# Patient Record
Sex: Female | Born: 1950
Health system: Southern US, Community
[De-identification: ages and names within clinical notes are randomized; demographics above are authoritative.]

## PROBLEM LIST (undated history)

## (undated) DIAGNOSIS — F32A Depression, unspecified: Secondary | ICD-10-CM

## (undated) DIAGNOSIS — M1990 Unspecified osteoarthritis, unspecified site: Secondary | ICD-10-CM

## (undated) DIAGNOSIS — K219 Gastro-esophageal reflux disease without esophagitis: Secondary | ICD-10-CM

## (undated) DIAGNOSIS — F329 Major depressive disorder, single episode, unspecified: Secondary | ICD-10-CM

## (undated) DIAGNOSIS — C801 Malignant (primary) neoplasm, unspecified: Secondary | ICD-10-CM

## (undated) DIAGNOSIS — Z87898 Personal history of other specified conditions: Secondary | ICD-10-CM

## (undated) DIAGNOSIS — R7303 Prediabetes: Secondary | ICD-10-CM

## (undated) DIAGNOSIS — F419 Anxiety disorder, unspecified: Secondary | ICD-10-CM

## (undated) DIAGNOSIS — J302 Other seasonal allergic rhinitis: Secondary | ICD-10-CM

## (undated) DIAGNOSIS — I8390 Asymptomatic varicose veins of unspecified lower extremity: Secondary | ICD-10-CM

## (undated) DIAGNOSIS — J45909 Unspecified asthma, uncomplicated: Secondary | ICD-10-CM

## (undated) HISTORY — DX: Unspecified asthma, uncomplicated: J45.909

## (undated) HISTORY — PX: COLONOSCOPY: SHX174

## (undated) HISTORY — PX: EYE SURGERY: SHX253

## (undated) HISTORY — PX: OTHER SURGICAL HISTORY: SHX169

## (undated) HISTORY — PX: TUBAL LIGATION: SHX77

## (undated) HISTORY — PX: VARICOSE VEIN SURGERY: SHX832

---

## 2000-05-19 ENCOUNTER — Ambulatory Visit (HOSPITAL_COMMUNITY): Admission: RE | Admit: 2000-05-19 | Discharge: 2000-05-19 | Payer: Self-pay | Admitting: Family Medicine

## 2000-05-19 ENCOUNTER — Encounter: Payer: Self-pay | Admitting: Family Medicine

## 2001-02-20 ENCOUNTER — Ambulatory Visit (HOSPITAL_COMMUNITY): Admission: RE | Admit: 2001-02-20 | Discharge: 2001-02-20 | Payer: Self-pay | Admitting: *Deleted

## 2001-07-12 ENCOUNTER — Ambulatory Visit (HOSPITAL_COMMUNITY): Admission: RE | Admit: 2001-07-12 | Discharge: 2001-07-12 | Payer: Self-pay | Admitting: Family Medicine

## 2001-07-12 ENCOUNTER — Encounter: Admission: RE | Admit: 2001-07-12 | Discharge: 2001-07-12 | Payer: Self-pay | Admitting: Family Medicine

## 2001-07-12 ENCOUNTER — Encounter: Payer: Self-pay | Admitting: Family Medicine

## 2002-09-25 ENCOUNTER — Ambulatory Visit (HOSPITAL_COMMUNITY): Admission: RE | Admit: 2002-09-25 | Discharge: 2002-09-25 | Payer: Self-pay | Admitting: Hematology & Oncology

## 2002-09-25 ENCOUNTER — Encounter: Payer: Self-pay | Admitting: Hematology & Oncology

## 2003-01-02 ENCOUNTER — Ambulatory Visit (HOSPITAL_COMMUNITY): Admission: RE | Admit: 2003-01-02 | Discharge: 2003-01-02 | Payer: Self-pay | Admitting: Hematology & Oncology

## 2003-02-28 ENCOUNTER — Ambulatory Visit (HOSPITAL_COMMUNITY): Admission: RE | Admit: 2003-02-28 | Discharge: 2003-02-28 | Payer: Self-pay | Admitting: Family Medicine

## 2003-04-16 ENCOUNTER — Ambulatory Visit (HOSPITAL_COMMUNITY): Admission: RE | Admit: 2003-04-16 | Discharge: 2003-04-16 | Payer: Self-pay | Admitting: Hematology & Oncology

## 2003-08-22 ENCOUNTER — Ambulatory Visit (HOSPITAL_COMMUNITY): Admission: RE | Admit: 2003-08-22 | Discharge: 2003-08-22 | Payer: Self-pay | Admitting: Hematology & Oncology

## 2004-03-24 ENCOUNTER — Ambulatory Visit: Payer: Self-pay | Admitting: Hematology & Oncology

## 2004-03-26 ENCOUNTER — Ambulatory Visit (HOSPITAL_COMMUNITY): Admission: RE | Admit: 2004-03-26 | Discharge: 2004-03-26 | Payer: Self-pay | Admitting: Hematology & Oncology

## 2004-09-22 ENCOUNTER — Encounter: Admission: RE | Admit: 2004-09-22 | Discharge: 2004-09-22 | Payer: Self-pay | Admitting: Family Medicine

## 2004-09-24 ENCOUNTER — Ambulatory Visit: Payer: Self-pay | Admitting: Hematology & Oncology

## 2004-09-28 ENCOUNTER — Ambulatory Visit (HOSPITAL_COMMUNITY): Admission: RE | Admit: 2004-09-28 | Discharge: 2004-09-28 | Payer: Self-pay | Admitting: Hematology & Oncology

## 2005-03-11 ENCOUNTER — Ambulatory Visit: Payer: Self-pay | Admitting: Hematology & Oncology

## 2005-03-18 ENCOUNTER — Ambulatory Visit (HOSPITAL_COMMUNITY): Admission: RE | Admit: 2005-03-18 | Discharge: 2005-03-18 | Payer: Self-pay | Admitting: Hematology & Oncology

## 2005-09-02 ENCOUNTER — Ambulatory Visit: Payer: Self-pay | Admitting: Hematology & Oncology

## 2005-09-07 LAB — CBC WITH DIFFERENTIAL/PLATELET
BASO%: 0.3 % (ref 0.0–2.0)
Eosinophils Absolute: 0.1 10*3/uL (ref 0.0–0.5)
LYMPH%: 37.8 % (ref 14.0–48.0)
MCHC: 33.9 g/dL (ref 32.0–36.0)
MCV: 83.8 fL (ref 81.0–101.0)
MONO%: 6.3 % (ref 0.0–13.0)
NEUT#: 4.4 10*3/uL (ref 1.5–6.5)
Platelets: 305 10*3/uL (ref 145–400)
RBC: 4.22 10*6/uL (ref 3.70–5.32)
RDW: 14.7 % — ABNORMAL HIGH (ref 11.3–14.5)
WBC: 8.1 10*3/uL (ref 3.9–10.0)

## 2005-09-07 LAB — COMPREHENSIVE METABOLIC PANEL
ALT: 12 U/L (ref 0–40)
AST: 13 U/L (ref 0–37)
Albumin: 3.8 g/dL (ref 3.5–5.2)
Alkaline Phosphatase: 70 U/L (ref 39–117)
Glucose, Bld: 99 mg/dL (ref 70–99)
Potassium: 3.7 mEq/L (ref 3.5–5.3)
Sodium: 141 mEq/L (ref 135–145)
Total Bilirubin: 0.2 mg/dL — ABNORMAL LOW (ref 0.3–1.2)
Total Protein: 6.7 g/dL (ref 6.0–8.3)

## 2005-09-14 ENCOUNTER — Ambulatory Visit (HOSPITAL_COMMUNITY): Admission: RE | Admit: 2005-09-14 | Discharge: 2005-09-14 | Payer: Self-pay | Admitting: Hematology & Oncology

## 2005-09-29 ENCOUNTER — Ambulatory Visit: Payer: Self-pay | Admitting: Cardiology

## 2005-09-30 ENCOUNTER — Ambulatory Visit (HOSPITAL_COMMUNITY): Admission: RE | Admit: 2005-09-30 | Discharge: 2005-09-30 | Payer: Self-pay | Admitting: Family Medicine

## 2006-03-02 ENCOUNTER — Ambulatory Visit: Payer: Self-pay

## 2006-09-27 ENCOUNTER — Ambulatory Visit: Payer: Self-pay | Admitting: Hematology & Oncology

## 2006-10-03 ENCOUNTER — Ambulatory Visit (HOSPITAL_COMMUNITY): Admission: RE | Admit: 2006-10-03 | Discharge: 2006-10-03 | Payer: Self-pay | Admitting: Family Medicine

## 2007-04-12 ENCOUNTER — Ambulatory Visit: Payer: Self-pay | Admitting: Hematology & Oncology

## 2007-04-14 LAB — CBC WITH DIFFERENTIAL/PLATELET
Basophils Absolute: 0 10*3/uL (ref 0.0–0.1)
Eosinophils Absolute: 0.1 10*3/uL (ref 0.0–0.5)
HGB: 12.6 g/dL (ref 11.6–15.9)
NEUT#: 3.5 10*3/uL (ref 1.5–6.5)
RBC: 4.54 10*6/uL (ref 3.70–5.32)
RDW: 15 % — ABNORMAL HIGH (ref 11.3–14.5)
WBC: 6.2 10*3/uL (ref 3.9–10.0)
lymph#: 2.3 10*3/uL (ref 0.9–3.3)

## 2007-04-14 LAB — COMPREHENSIVE METABOLIC PANEL
AST: 12 U/L (ref 0–37)
Albumin: 4.1 g/dL (ref 3.5–5.2)
BUN: 10 mg/dL (ref 6–23)
Calcium: 8.8 mg/dL (ref 8.4–10.5)
Chloride: 106 mEq/L (ref 96–112)
Glucose, Bld: 99 mg/dL (ref 70–99)
Potassium: 4 mEq/L (ref 3.5–5.3)
Sodium: 141 mEq/L (ref 135–145)
Total Protein: 7 g/dL (ref 6.0–8.3)

## 2008-02-19 ENCOUNTER — Ambulatory Visit (HOSPITAL_COMMUNITY): Admission: RE | Admit: 2008-02-19 | Discharge: 2008-02-19 | Payer: Self-pay | Admitting: Family Medicine

## 2008-02-22 ENCOUNTER — Ambulatory Visit (HOSPITAL_COMMUNITY): Admission: RE | Admit: 2008-02-22 | Discharge: 2008-02-22 | Payer: Self-pay | Admitting: Family Medicine

## 2008-03-19 DIAGNOSIS — I10 Essential (primary) hypertension: Secondary | ICD-10-CM | POA: Insufficient documentation

## 2008-03-19 DIAGNOSIS — E785 Hyperlipidemia, unspecified: Secondary | ICD-10-CM | POA: Insufficient documentation

## 2008-03-20 ENCOUNTER — Encounter: Payer: Self-pay | Admitting: Cardiology

## 2008-03-20 ENCOUNTER — Ambulatory Visit: Payer: Self-pay | Admitting: Cardiology

## 2008-03-20 DIAGNOSIS — R002 Palpitations: Secondary | ICD-10-CM | POA: Insufficient documentation

## 2008-03-20 DIAGNOSIS — R079 Chest pain, unspecified: Secondary | ICD-10-CM | POA: Insufficient documentation

## 2008-03-20 DIAGNOSIS — R0602 Shortness of breath: Secondary | ICD-10-CM | POA: Insufficient documentation

## 2008-03-20 DIAGNOSIS — F411 Generalized anxiety disorder: Secondary | ICD-10-CM | POA: Insufficient documentation

## 2008-04-10 ENCOUNTER — Ambulatory Visit: Payer: Self-pay | Admitting: Hematology & Oncology

## 2008-04-11 ENCOUNTER — Telehealth (INDEPENDENT_AMBULATORY_CARE_PROVIDER_SITE_OTHER): Payer: Self-pay | Admitting: *Deleted

## 2008-04-15 ENCOUNTER — Ambulatory Visit: Payer: Self-pay

## 2008-04-15 ENCOUNTER — Ambulatory Visit: Payer: Self-pay | Admitting: Cardiology

## 2008-04-15 ENCOUNTER — Encounter: Payer: Self-pay | Admitting: Internal Medicine

## 2008-05-10 ENCOUNTER — Ambulatory Visit: Payer: Self-pay | Admitting: Oncology

## 2008-05-10 LAB — CBC WITH DIFFERENTIAL/PLATELET
Basophils Absolute: 0 10*3/uL (ref 0.0–0.1)
EOS%: 1.1 % (ref 0.0–7.0)
Eosinophils Absolute: 0.1 10*3/uL (ref 0.0–0.5)
HCT: 36.3 % (ref 34.8–46.6)
HGB: 12.4 g/dL (ref 11.6–15.9)
MCH: 28.7 pg (ref 25.1–34.0)
NEUT%: 57 % (ref 38.4–76.8)
lymph#: 2.3 10*3/uL (ref 0.9–3.3)

## 2008-05-10 LAB — COMPREHENSIVE METABOLIC PANEL
AST: 14 U/L (ref 0–37)
BUN: 12 mg/dL (ref 6–23)
CO2: 23 mEq/L (ref 19–32)
Calcium: 8.9 mg/dL (ref 8.4–10.5)
Chloride: 107 mEq/L (ref 96–112)
Creatinine, Ser: 0.93 mg/dL (ref 0.40–1.20)
Glucose, Bld: 98 mg/dL (ref 70–99)

## 2008-05-10 LAB — LACTATE DEHYDROGENASE: LDH: 162 U/L (ref 94–250)

## 2008-05-14 ENCOUNTER — Telehealth (INDEPENDENT_AMBULATORY_CARE_PROVIDER_SITE_OTHER): Payer: Self-pay | Admitting: *Deleted

## 2009-07-20 ENCOUNTER — Emergency Department (HOSPITAL_COMMUNITY): Admission: EM | Admit: 2009-07-20 | Discharge: 2009-07-20 | Payer: Self-pay | Admitting: Family Medicine

## 2009-08-16 ENCOUNTER — Emergency Department (HOSPITAL_COMMUNITY): Admission: EM | Admit: 2009-08-16 | Discharge: 2009-08-16 | Payer: Self-pay | Admitting: Emergency Medicine

## 2009-09-04 ENCOUNTER — Ambulatory Visit (HOSPITAL_COMMUNITY): Admission: RE | Admit: 2009-09-04 | Discharge: 2009-09-04 | Payer: Self-pay | Admitting: Family Medicine

## 2010-01-24 ENCOUNTER — Encounter: Payer: Self-pay | Admitting: Cardiology

## 2010-01-25 ENCOUNTER — Encounter: Payer: Self-pay | Admitting: Family Medicine

## 2010-01-25 ENCOUNTER — Encounter: Payer: Self-pay | Admitting: Hematology & Oncology

## 2010-01-30 ENCOUNTER — Emergency Department (INDEPENDENT_AMBULATORY_CARE_PROVIDER_SITE_OTHER)
Admission: EM | Admit: 2010-01-30 | Discharge: 2010-01-30 | Payer: Self-pay | Source: Home / Self Care | Admitting: Emergency Medicine

## 2010-01-30 DIAGNOSIS — R109 Unspecified abdominal pain: Secondary | ICD-10-CM

## 2010-01-30 DIAGNOSIS — W19XXXA Unspecified fall, initial encounter: Secondary | ICD-10-CM

## 2010-01-30 LAB — COMPREHENSIVE METABOLIC PANEL
ALT: 36 U/L — ABNORMAL HIGH (ref 0–35)
AST: 36 U/L (ref 0–37)
Albumin: 4.2 g/dL (ref 3.5–5.2)
Alkaline Phosphatase: 110 U/L (ref 39–117)
Chloride: 106 mEq/L (ref 96–112)
GFR calc Af Amer: >60 mL/min (ref >60)
Potassium: 4.3 mEq/L (ref 3.5–5.1)
Total Bilirubin: 0.7 mg/dL (ref 0.3–1.2)

## 2010-01-30 LAB — DIFFERENTIAL
Basophils Relative: 0 % (ref 0–1)
Eosinophils Absolute: 0.2 10*3/uL (ref 0.0–0.7)
Lymphs Abs: 3.7 10*3/uL (ref 0.7–4.0)
Neutrophils Relative %: 51 % (ref 43–77)

## 2010-01-30 LAB — URINALYSIS, ROUTINE W REFLEX MICROSCOPIC
Bilirubin Urine: NEGATIVE
Hgb urine dipstick: NEGATIVE
Protein, ur: NEGATIVE mg/dL
Urobilinogen, UA: 0.2 mg/dL (ref 0.0–1.0)

## 2010-01-30 LAB — CBC
Platelets: 242 10*3/uL (ref 150–400)
RBC: 4.46 MIL/uL (ref 3.87–5.11)
WBC: 9.7 10*3/uL (ref 4.0–10.5)

## 2010-01-30 LAB — URINE MICROSCOPIC-ADD ON

## 2010-03-20 LAB — POCT URINALYSIS DIPSTICK
Bilirubin Urine: NEGATIVE
Ketones, ur: NEGATIVE mg/dL
pH: 7 (ref 5.0–8.0)

## 2010-05-22 NOTE — Assessment & Plan Note (Signed)
[facility]                              CARDIOLOGY OFFICE NOTE   NAME:[name] [name] FOXWORTHY                  MRN:          [id number]  DATE:09/29/2005                            DOB:          18-Jul-1950    PRIMARY:  [name] [name], M.D.   REASON FOR PRESENTATION:  Evaluate patient with dyspnea on exertion,  progressive, and cardiovascular risk factors.   HISTORY OF PRESENT ILLNESS:  The patient is a lovely 60 year old white  female who has had no prior cardiac history or workup.  She does have a  significant family history as described below.  She has had some right arm  discomfort.  This has been going on for about a year.  It is sporadic and  not necessarily associated with exertion.  She also has some right jaw  discomfort but has ascribed this to TMJ.  Again, this is at rest.  This  happens sporadically.  She does not describe substernal chest pressure, left  arm discomfort, activity-induced nausea, vomiting, excessive diaphoresis.  She does have dyspnea.  This has been progressive over the year.  It is  noticeable to her.  She gets dyspneic with moderate activity.  She thinks  that this is out of proportion to what she is doing and her level of  fitness.  Again, it has been progressive and noticeable and mildly limiting.  She has had no resting shortness of breath and denies any PND or orthopnea.   PAST MEDICAL HISTORY:  The patient has no history of hypertension, diabetes  or hyperlipidemia.   PAST SURGICAL HISTORY:  1. Tubal ligation.  2. Malignant melanoma resected.  3. Trigger thumb surgery.   ALLERGIES:  NONE.   MEDICATIONS:  1. Prempro 0.625 mg/5 mg.  2. Aciphex 20 mg daily (the patient was prescribed Singulair and Allegra      but does not take these currently).   SOCIAL HISTORY:  The patient is a Location manager.  She is married.  She  has two children.  She quit smoking in 1993 after a 20 pack year history.   FAMILY  HISTORY:  Is contributory for her father dying of coronary artery  disease at 70.  It was apparently quite extensive at that time.  Her mother  also died at age 85 suddenly with three vessel coronary disease.  She has a  brother with heart disease and her parents severe ischemic cardiomyopathy.  He is still alive at age 53.   REVIEW OF SYSTEMS:  Positive for headaches, positive for constipation, joint  pains.  Negative for other systems except as stated in the HPI.   PHYSICAL EXAMINATION:  The patient is in no distress.  Blood pressure  118/78, heart rate 71 and regular, weight 176 pounds, body mass index 29.  HEENT:  Eyelids unremarkable, pupils equal, round, react to light, fundi  within normal limits, oral mucosa unremarkable.  NECK:  No jugular venous distention, wave form within normal limits, carotid  upstroke brisk and symmetric, no bruits, no thyromegaly.  LYMPHATICS:  No cervical, axillary or inguinal adenopathy.  LUNGS:  Clear to  auscultation bilaterally.  BACK:  No costovertebral angle tenderness.  CHEST:  Unremarkable.  HEART:  PMI not displaced or sustained, S1 and S2 within normal limits.  No  S3, no S4, no murmurs.  ABDOMEN:  Flat, positive bowel sounds normal in frequency and pitch, no  bruits, no rebound, no guarding, no midline or pulsatile masses, no  hepatomegaly, no splenomegaly.  SKIN:  No rashes, no nodules.  EXTREMITIES:  Pulses 2+ throughout, no edema, no cyanosis, no clubbing.  NEURO:  Oriented to person, place and time, cranial nerves II-XII grossly  intact, motor grossly intact.   EKG:  Sinus rhythm, rate 71, axis within normal limits, intervals within  normal limits, no acute ST-T wave changes.   ASSESSMENT AND PLAN:  1. Dyspnea.  The patient's predominant complaint is dyspnea with exertion.      This very easily could be an anginal equivalent.  Her pretest      probability of coronary disease is at least moderate.  At this point,      she needs  evaluation with either stress perfusion or coronary CT scan.      I think the safest and most valuable test here would be coronary      angiogram with a CT scan.  This will not only allow [place] to screen for      obstructive coronary disease, but to determine the overall plaque      burden and therefore suggest the appropriate level of aggressiveness.  2. Risk reduction.  I would ask to see her most recent lipid profile which      will be viewed in the      context of the upcoming CT scan.  3. Follow up will be based on the results of this study.            ______________________________  Rollene Rotunda, MD, [facility]    JH/MedQ  DD:  09/29/2005 DT:  10/01/2005 Job #:  [id number]   cc:   [name] [name], M.D.

## 2010-05-22 NOTE — Procedures (Signed)
[facility]                                  PROCEDURE NOTE   NAME:Sparacino, MORGANNE HAILE                  MRN:          [id number]  DATE:                                      DOB:          [birth year]    To whom it may concern.   This letter is a request for approval for a cardiac CT on Ms. Morrical.  This patient presented with significant dyspnea on exertion which I believe  possibly to be an anginal equivalent.  Her pretest probability for  obstructive coronary disease is moderately high.  She has a history of  smoking.  She also has a strong family history of coronary artery disease.  She is 60 years old.  At this point I think cardiac CT is the safest, most  appropriate route  to determine presence or absence of plaque burden and  presence or absence of obstructive coronary disease and in addition,  quantify her plaque burden to further guide therapy.   Thank you for your attention to this matter.            ______________________________  Rollene Rotunda, MD, [facility]      JH/MedQ  DD:  10/06/2005  DT:  10/07/2005  Job #:  [id number]

## 2011-10-15 ENCOUNTER — Other Ambulatory Visit (HOSPITAL_COMMUNITY): Payer: Self-pay | Admitting: Family Medicine

## 2011-10-15 DIAGNOSIS — Z1231 Encounter for screening mammogram for malignant neoplasm of breast: Secondary | ICD-10-CM

## 2011-10-22 ENCOUNTER — Ambulatory Visit (HOSPITAL_COMMUNITY)
Admission: RE | Admit: 2011-10-22 | Discharge: 2011-10-22 | Disposition: A | Discharge status: Home / Self Care | Payer: 59 | Source: Ambulatory Visit | Attending: Family Medicine | Admitting: Family Medicine

## 2011-10-22 DIAGNOSIS — Z1231 Encounter for screening mammogram for malignant neoplasm of breast: Secondary | ICD-10-CM | POA: Insufficient documentation

## 2012-12-04 ENCOUNTER — Other Ambulatory Visit (HOSPITAL_COMMUNITY): Payer: Self-pay | Admitting: Family Medicine

## 2012-12-04 DIAGNOSIS — Z1231 Encounter for screening mammogram for malignant neoplasm of breast: Secondary | ICD-10-CM

## 2012-12-15 ENCOUNTER — Ambulatory Visit (HOSPITAL_COMMUNITY)
Admission: RE | Admit: 2012-12-15 | Discharge: 2012-12-15 | Disposition: A | Discharge status: Home / Self Care | Payer: 59 | Source: Ambulatory Visit | Attending: Family Medicine | Admitting: Family Medicine

## 2012-12-15 DIAGNOSIS — Z1231 Encounter for screening mammogram for malignant neoplasm of breast: Secondary | ICD-10-CM | POA: Insufficient documentation

## 2013-10-03 ENCOUNTER — Encounter (HOSPITAL_COMMUNITY): Payer: Self-pay | Admitting: Pharmacist

## 2013-10-08 ENCOUNTER — Encounter (HOSPITAL_COMMUNITY): Payer: Self-pay

## 2013-10-08 NOTE — Patient Instructions (Addendum)
   Your procedure is scheduled on:  Thursday, Oct 8  Enter through the [employer] [facility] at:  6 AM Pick up the phone at the desk and dial [phone] and inform us of your arrival.  Please call this number if you have any problems the morning of surgery: [phone]  Remember: Do not eat or drink after midnight: Wednesday Take these medicines the morning of surgery with a SIP OF WATER:  zyrtec if needed  Do not wear jewelry, make-up, or FINGER nail polish No metal in your hair or on your body. Do not wear lotions, powders, perfumes.  You may wear deodorant.  Do not bring valuables to the hospital. Contacts, dentures or bridgework may not be worn into surgery.  Leave suitcase in the car. After Surgery it may be brought to your room. For patients being admitted to the hospital, checkout time is 11:00am the day of discharge.  Home with husband "Mikki Santee"  cell [phone]

## 2013-10-09 ENCOUNTER — Encounter (HOSPITAL_COMMUNITY)
Admission: RE | Admit: 2013-10-09 | Discharge: 2013-10-09 | Disposition: A | Discharge status: Home / Self Care | Payer: 59 | Source: Ambulatory Visit | Attending: Obstetrics and Gynecology | Admitting: Obstetrics and Gynecology

## 2013-10-09 ENCOUNTER — Encounter (HOSPITAL_COMMUNITY): Payer: Self-pay

## 2013-10-09 DIAGNOSIS — I10 Essential (primary) hypertension: Secondary | ICD-10-CM | POA: Diagnosis not present

## 2013-10-09 DIAGNOSIS — E785 Hyperlipidemia, unspecified: Secondary | ICD-10-CM | POA: Diagnosis not present

## 2013-10-09 DIAGNOSIS — Z87891 Personal history of nicotine dependence: Secondary | ICD-10-CM | POA: Diagnosis not present

## 2013-10-09 DIAGNOSIS — K219 Gastro-esophageal reflux disease without esophagitis: Secondary | ICD-10-CM | POA: Diagnosis not present

## 2013-10-09 DIAGNOSIS — N8189 Other female genital prolapse: Secondary | ICD-10-CM | POA: Diagnosis not present

## 2013-10-09 HISTORY — DX: Major depressive disorder, single episode, unspecified: F32.9

## 2013-10-09 HISTORY — DX: Depression, unspecified: F32.A

## 2013-10-09 HISTORY — DX: Other seasonal allergic rhinitis: J30.2

## 2013-10-09 HISTORY — DX: Unspecified osteoarthritis, unspecified site: M19.90

## 2013-10-09 HISTORY — DX: Asymptomatic varicose veins of unspecified lower extremity: I83.90

## 2013-10-09 HISTORY — DX: Gastro-esophageal reflux disease without esophagitis: K21.9

## 2013-10-09 LAB — COMPREHENSIVE METABOLIC PANEL
ALBUMIN: 3.7 g/dL (ref 3.5–5.2)
ALT: 27 U/L (ref 0–35)
AST: 23 U/L (ref 0–37)
Alkaline Phosphatase: 127 U/L — ABNORMAL HIGH (ref 39–117)
Anion gap: 10 (ref 5–15)
BUN: 12 mg/dL (ref 6–23)
CALCIUM: 10.4 mg/dL (ref 8.4–10.5)
CO2: 27 meq/L (ref 19–32)
Chloride: 105 mEq/L (ref 96–112)
Creatinine, Ser: 0.72 mg/dL (ref 0.50–1.10)
GFR calc Af Amer: >90 mL/min (ref >90)
GFR calc non Af Amer: 89 mL/min — ABNORMAL LOW (ref >90)
Glucose, Bld: 134 mg/dL — ABNORMAL HIGH (ref 70–99)
Potassium: 3.9 mEq/L (ref 3.7–5.3)
SODIUM: 142 meq/L (ref 137–147)
TOTAL PROTEIN: 6.9 g/dL (ref 6.0–8.3)
Total Bilirubin: 0.3 mg/dL (ref 0.3–1.2)

## 2013-10-09 LAB — CBC
HCT: 39.7 % (ref 36.0–46.0)
Hemoglobin: 13.4 g/dL (ref 12.0–15.0)
MCH: 29.8 pg (ref 26.0–34.0)
MCHC: 33.8 g/dL (ref 30.0–36.0)
MCV: 88.4 fL (ref 78.0–100.0)
Platelets: 188 10*3/uL (ref 150–400)
RBC: 4.49 MIL/uL (ref 3.87–5.11)
RDW: 13.8 % (ref 11.5–15.5)
WBC: 6.3 10*3/uL (ref 4.0–10.5)

## 2013-10-09 NOTE — Pre-Procedure Instructions (Signed)
Reviewed patient's medication, medical history and EKG with Dr Primitivo Gauze and Dr Rudean Curt.  Rosalia for surgery.

## 2013-10-10 ENCOUNTER — Encounter (HOSPITAL_COMMUNITY): Payer: Self-pay | Admitting: Anesthesiology

## 2013-10-10 NOTE — H&P (Signed)
[name] [name] is an 63 y.o. female with pelvic pressure and discomfort with pelvic prolapse.  Pertinent Gynecological History: Menses: post-menopausal Bleeding: N/A Contraception: none DES exposure: denies Blood transfusions: none Sexually transmitted diseases: no past history Previous GYN Procedures: none  Last mammogram: normal Date: 2015 Last pap: normal Date: 2015 OB History: G2, P2   Menstrual History: Menarche age: unknown  No LMP recorded. Patient is postmenopausal.    Past Medical History  Diagnosis Date  . GERD (gastroesophageal reflux disease)   . Seasonal allergies   . Constipation   . SVD (spontaneous vaginal delivery)     x 2  . Varicose vein   . Arthritis     hands    Past Surgical History  Procedure Laterality Date  . Tubal ligation    . Trigger thumb surgery      right  . Malignant melanoma resected      lower back area  . Eye surgery      Bilateral Lasik  . Varicose vein surgery      leg  . Colonoscopy      No family history on file.  Social History:  reports that she quit smoking about 22 years ago. Her smoking use included Cigarettes. She has a 10 pack-year smoking history. She has never used smokeless tobacco. She reports that she does not drink alcohol or use illicit drugs.  Allergies: No Known Allergies  No prescriptions prior to admission    Review of Systems  Constitutional: Negative for fever.    There were no vitals taken for this visit. Physical Exam  Cardiovascular: Normal rate and regular rhythm.   Respiratory: Effort normal and breath sounds normal.  GI: Soft. Bowel sounds are normal.  Genitourinary:  Uterus normal size with second degree prolapse Adnexa NT without masses  Neurological: She has normal reflexes.    No results found for this or any previous visit (from the past 24 hour(s)).  No results found.  Assessment/Plan: 63 yo G2P2 with symptomatic pelvic prolapse LAVH/BSO, A&P repair with SSLS  And  risks D/W patient including infection, organ damage, bleeding/transfusion-HIV/Hep, DVT/PE, pneumonia, fistula, pelvic pain, abdominal pain, painful intercourse, return to OR, recurrent prolapse. All questions answered.  [name] [name],[name] [name] E 10/10/2013, 6:25 PM

## 2013-10-10 NOTE — Anesthesia Preprocedure Evaluation (Addendum)
Anesthesia Evaluation  Patient identified by MRN, date of birth, ID band Patient awake    Reviewed: Allergy & Precautions, H&P , NPO status , Patient's Chart, lab work & pertinent test results  Airway Mallampati: III TM Distance: >3 FB Neck ROM: Full    Dental no notable dental hx. (+) Teeth Intact   Pulmonary shortness of breath, former smoker,  breath sounds clear to auscultation  Pulmonary exam normal       Cardiovascular hypertension, Rhythm:Regular Rate:Normal     Neuro/Psych Anxiety negative neurological ROS     GI/Hepatic Neg liver ROS, GERD-  Medicated and Controlled,  Endo/Other  Hyperlipidemia  Renal/GU negative Renal ROS  negative genitourinary   Musculoskeletal  (+) Arthritis -, Osteoarthritis,    Abdominal   Peds  Hematology negative hematology ROS (+)   Anesthesia Other Findings   Reproductive/Obstetrics Pelvic prolapse                          Anesthesia Physical Anesthesia Plan  ASA: II  Anesthesia Plan: General   Post-op Pain Management:    Induction: Intravenous  Airway Management Planned: Oral ETT  Additional Equipment:   Intra-op Plan:   Post-operative Plan: Extubation in OR  Informed Consent: I have reviewed the patients History and Physical, chart, labs and discussed the procedure including the risks, benefits and alternatives for the proposed anesthesia with the patient or authorized representative who has indicated his/her understanding and acceptance.   Dental advisory given  Plan Discussed with: Anesthesiologist, CRNA and Surgeon  Anesthesia Plan Comments:         Anesthesia Quick Evaluation

## 2013-10-11 ENCOUNTER — Encounter (HOSPITAL_COMMUNITY): Payer: 59 | Admitting: Anesthesiology

## 2013-10-11 ENCOUNTER — Observation Stay (HOSPITAL_COMMUNITY)
Admission: RE | Admit: 2013-10-11 | Discharge: 2013-10-12 | Disposition: A | Discharge status: Home / Self Care | Payer: 59 | Source: Ambulatory Visit | Attending: Obstetrics and Gynecology | Admitting: Obstetrics and Gynecology

## 2013-10-11 ENCOUNTER — Encounter (HOSPITAL_COMMUNITY)
Admission: RE | Disposition: A | Discharge status: Home / Self Care | Payer: Self-pay | Source: Ambulatory Visit | Attending: Obstetrics and Gynecology

## 2013-10-11 ENCOUNTER — Ambulatory Visit (HOSPITAL_COMMUNITY): Payer: 59 | Admitting: Anesthesiology

## 2013-10-11 ENCOUNTER — Encounter (HOSPITAL_COMMUNITY): Payer: Self-pay | Admitting: Certified Registered Nurse Anesthetist

## 2013-10-11 DIAGNOSIS — N819 Female genital prolapse, unspecified: Secondary | ICD-10-CM | POA: Diagnosis present

## 2013-10-11 DIAGNOSIS — N8189 Other female genital prolapse: Principal | ICD-10-CM | POA: Insufficient documentation

## 2013-10-11 DIAGNOSIS — I10 Essential (primary) hypertension: Secondary | ICD-10-CM | POA: Insufficient documentation

## 2013-10-11 DIAGNOSIS — K219 Gastro-esophageal reflux disease without esophagitis: Secondary | ICD-10-CM | POA: Insufficient documentation

## 2013-10-11 DIAGNOSIS — E785 Hyperlipidemia, unspecified: Secondary | ICD-10-CM | POA: Insufficient documentation

## 2013-10-11 DIAGNOSIS — Z87891 Personal history of nicotine dependence: Secondary | ICD-10-CM | POA: Insufficient documentation

## 2013-10-11 HISTORY — PX: LAPAROSCOPIC ASSISTED VAGINAL HYSTERECTOMY: SHX5398

## 2013-10-11 SURGERY — HYSTERECTOMY, VAGINAL, LAPAROSCOPY-ASSISTED
Anesthesia: General | Site: Vagina

## 2013-10-11 MED ORDER — PROPOFOL 10 MG/ML IV EMUL
INTRAVENOUS | Status: AC
  Filled 2013-10-11: qty 20

## 2013-10-11 MED ORDER — SENNA 8.6 MG PO TABS
1.0000 | ORAL_TABLET | Freq: Two times a day (BID) | ORAL | Status: DC
  Administered 2013-10-11: 8.6 mg via ORAL
  Filled 2013-10-11 (×3): qty 1

## 2013-10-11 MED ORDER — ONDANSETRON HCL 4 MG/2ML IJ SOLN
4.0000 mg | Freq: Four times a day (QID) | INTRAMUSCULAR | Status: DC | PRN
Start: 2013-10-11 — End: 2013-10-12

## 2013-10-11 MED ORDER — ONDANSETRON HCL 4 MG/2ML IJ SOLN
INTRAMUSCULAR | Status: DC | PRN
  Administered 2013-10-11: 4 mg via INTRAVENOUS

## 2013-10-11 MED ORDER — FLUORESCEIN SODIUM 10 % IJ SOLN
500.0000 mg | Freq: Once | INTRAMUSCULAR | Status: DC
  Filled 2013-10-11: qty 5

## 2013-10-11 MED ORDER — MENTHOL 3 MG MT LOZG: 1.0000 | LOZENGE | OROMUCOSAL | Status: DC | PRN

## 2013-10-11 MED ORDER — BUPIVACAINE HCL (PF) 0.5 % IJ SOLN
INTRAMUSCULAR | Status: DC | PRN
Start: 2013-10-11 — End: 2013-10-11
  Administered 2013-10-11: 30 mL

## 2013-10-11 MED ORDER — BUPIVACAINE-EPINEPHRINE 0.5% -1:200000 IJ SOLN: INTRAMUSCULAR | Status: DC | PRN

## 2013-10-11 MED ORDER — DEXAMETHASONE SODIUM PHOSPHATE 4 MG/ML IJ SOLN
INTRAMUSCULAR | Status: AC
  Filled 2013-10-11: qty 1

## 2013-10-11 MED ORDER — BUPIVACAINE-EPINEPHRINE (PF) 0.5% -1:200000 IJ SOLN
INTRAMUSCULAR | Status: AC
  Filled 2013-10-11: qty 30

## 2013-10-11 MED ORDER — LACTATED RINGERS IV SOLN
INTRAVENOUS | Status: DC
  Administered 2013-10-11 (×2): via INTRAVENOUS

## 2013-10-11 MED ORDER — ALUM & MAG HYDROXIDE-SIMETH 200-200-20 MG/5ML PO SUSP: 30.0000 mL | ORAL | Status: DC | PRN

## 2013-10-11 MED ORDER — LIDOCAINE HCL (CARDIAC) 20 MG/ML IV SOLN
INTRAVENOUS | Status: AC
  Filled 2013-10-11: qty 5

## 2013-10-11 MED ORDER — KETOROLAC TROMETHAMINE 30 MG/ML IJ SOLN
INTRAMUSCULAR | Status: DC | PRN
  Administered 2013-10-11: 30 mg via INTRAVENOUS

## 2013-10-11 MED ORDER — LACTATED RINGERS IV SOLN
INTRAVENOUS | Status: DC
  Administered 2013-10-11 (×2): via INTRAVENOUS

## 2013-10-11 MED ORDER — GLYCOPYRROLATE 0.2 MG/ML IJ SOLN
INTRAMUSCULAR | Status: AC
  Filled 2013-10-11: qty 3

## 2013-10-11 MED ORDER — SCOPOLAMINE 1 MG/3DAYS TD PT72: 1.0000 | MEDICATED_PATCH | TRANSDERMAL | Status: DC

## 2013-10-11 MED ORDER — HYDROMORPHONE HCL 1 MG/ML IJ SOLN
INTRAMUSCULAR | Status: AC
  Administered 2013-10-11: 0.25 mg via INTRAVENOUS
  Filled 2013-10-11: qty 1

## 2013-10-11 MED ORDER — ONDANSETRON HCL 4 MG/2ML IJ SOLN: 4.0000 mg | Freq: Four times a day (QID) | INTRAMUSCULAR | Status: DC | PRN

## 2013-10-11 MED ORDER — CEFAZOLIN SODIUM-DEXTROSE 2-3 GM-% IV SOLR
2.0000 g | INTRAVENOUS | Status: AC
  Administered 2013-10-11: 2 g via INTRAVENOUS

## 2013-10-11 MED ORDER — FENTANYL CITRATE 0.05 MG/ML IJ SOLN
INTRAMUSCULAR | Status: AC
  Filled 2013-10-11: qty 5

## 2013-10-11 MED ORDER — KETOROLAC TROMETHAMINE 30 MG/ML IJ SOLN
INTRAMUSCULAR | Status: AC
  Filled 2013-10-11: qty 1

## 2013-10-11 MED ORDER — DIPHENHYDRAMINE HCL 50 MG/ML IJ SOLN: 12.5000 mg | Freq: Four times a day (QID) | INTRAMUSCULAR | Status: DC | PRN

## 2013-10-11 MED ORDER — SODIUM CHLORIDE 0.9 % IJ SOLN
INTRAMUSCULAR | Status: DC | PRN
  Administered 2013-10-11: 110 mL

## 2013-10-11 MED ORDER — METOCLOPRAMIDE HCL 5 MG/ML IJ SOLN: 10.0000 mg | Freq: Once | INTRAMUSCULAR | Status: DC | PRN

## 2013-10-11 MED ORDER — HYDROMORPHONE HCL 1 MG/ML IJ SOLN
INTRAMUSCULAR | Status: DC | PRN
  Administered 2013-10-11 (×2): 0.5 mg via INTRAVENOUS

## 2013-10-11 MED ORDER — LIDOCAINE HCL (CARDIAC) 20 MG/ML IV SOLN
INTRAVENOUS | Status: DC | PRN
  Administered 2013-10-11: 80 mg via INTRAVENOUS

## 2013-10-11 MED ORDER — SODIUM CHLORIDE 0.9 % IJ SOLN
INTRAMUSCULAR | Status: AC
  Filled 2013-10-11: qty 100

## 2013-10-11 MED ORDER — ESTRADIOL 0.1 MG/GM VA CREA
TOPICAL_CREAM | VAGINAL | Status: DC | PRN
  Administered 2013-10-11: 1 via VAGINAL

## 2013-10-11 MED ORDER — LIDOCAINE-EPINEPHRINE 0.5 %-1:200000 IJ SOLN
INTRAMUSCULAR | Status: AC
  Filled 2013-10-11: qty 1

## 2013-10-11 MED ORDER — SCOPOLAMINE 1 MG/3DAYS TD PT72
MEDICATED_PATCH | TRANSDERMAL | Status: AC
  Administered 2013-10-11: 1.5 mg via TRANSDERMAL
  Filled 2013-10-11: qty 1

## 2013-10-11 MED ORDER — INFLUENZA VAC SPLIT QUAD 0.5 ML IM SUSY
0.5000 mL | PREFILLED_SYRINGE | INTRAMUSCULAR | Status: AC
  Administered 2013-10-12: 0.5 mL via INTRAMUSCULAR

## 2013-10-11 MED ORDER — HYDROMORPHONE HCL 1 MG/ML IJ SOLN
0.2500 mg | INTRAMUSCULAR | Status: DC | PRN
  Administered 2013-10-11: 0.5 mg via INTRAVENOUS
  Administered 2013-10-11: 0.25 mg via INTRAVENOUS

## 2013-10-11 MED ORDER — BUPIVACAINE HCL (PF) 0.5 % IJ SOLN
INTRAMUSCULAR | Status: AC
  Filled 2013-10-11: qty 30

## 2013-10-11 MED ORDER — SODIUM CHLORIDE 0.9 % IJ SOLN: 9.0000 mL | INTRAMUSCULAR | Status: DC | PRN

## 2013-10-11 MED ORDER — PROPOFOL 10 MG/ML IV BOLUS
INTRAVENOUS | Status: DC | PRN
  Administered 2013-10-11: 130 mg via INTRAVENOUS

## 2013-10-11 MED ORDER — SCOPOLAMINE 1 MG/3DAYS TD PT72
1.0000 | MEDICATED_PATCH | Freq: Once | TRANSDERMAL | Status: DC
  Administered 2013-10-11: 1.5 mg via TRANSDERMAL

## 2013-10-11 MED ORDER — IBUPROFEN 600 MG PO TABS
600.0000 mg | ORAL_TABLET | Freq: Four times a day (QID) | ORAL | Status: DC | PRN
  Administered 2013-10-12: 600 mg via ORAL
  Filled 2013-10-11: qty 1

## 2013-10-11 MED ORDER — CEFAZOLIN SODIUM-DEXTROSE 2-3 GM-% IV SOLR
INTRAVENOUS | Status: AC
  Filled 2013-10-11: qty 50

## 2013-10-11 MED ORDER — MEPERIDINE HCL 25 MG/ML IJ SOLN: 6.2500 mg | INTRAMUSCULAR | Status: DC | PRN

## 2013-10-11 MED ORDER — MIDAZOLAM HCL 2 MG/2ML IJ SOLN
INTRAMUSCULAR | Status: AC
  Filled 2013-10-11: qty 2

## 2013-10-11 MED ORDER — OXYCODONE-ACETAMINOPHEN 5-325 MG PO TABS
1.0000 | ORAL_TABLET | ORAL | Status: DC | PRN
  Administered 2013-10-12: 1 via ORAL
  Filled 2013-10-11: qty 1

## 2013-10-11 MED ORDER — ONDANSETRON HCL 4 MG PO TABS: 4.0000 mg | ORAL_TABLET | Freq: Four times a day (QID) | ORAL | Status: DC | PRN

## 2013-10-11 MED ORDER — SODIUM CHLORIDE 0.9 % IJ SOLN
INTRAMUSCULAR | Status: AC
  Filled 2013-10-11: qty 10

## 2013-10-11 MED ORDER — ROCURONIUM BROMIDE 100 MG/10ML IV SOLN
INTRAVENOUS | Status: AC
  Filled 2013-10-11: qty 1

## 2013-10-11 MED ORDER — ROCURONIUM BROMIDE 100 MG/10ML IV SOLN
INTRAVENOUS | Status: DC | PRN
  Administered 2013-10-11: 50 mg via INTRAVENOUS

## 2013-10-11 MED ORDER — HYDROMORPHONE HCL 1 MG/ML IJ SOLN
INTRAMUSCULAR | Status: AC
  Filled 2013-10-11: qty 1

## 2013-10-11 MED ORDER — ESTRADIOL 0.1 MG/GM VA CREA
TOPICAL_CREAM | VAGINAL | Status: AC
  Filled 2013-10-11: qty 42.5

## 2013-10-11 MED ORDER — ZOLPIDEM TARTRATE 5 MG PO TABS: 5.0000 mg | ORAL_TABLET | Freq: Every evening | ORAL | Status: DC | PRN

## 2013-10-11 MED ORDER — MIDAZOLAM HCL 2 MG/2ML IJ SOLN
INTRAMUSCULAR | Status: DC | PRN
  Administered 2013-10-11: 2 mg via INTRAVENOUS

## 2013-10-11 MED ORDER — ONDANSETRON HCL 4 MG/2ML IJ SOLN
INTRAMUSCULAR | Status: AC
  Filled 2013-10-11: qty 2

## 2013-10-11 MED ORDER — STERILE WATER FOR IRRIGATION IR SOLN
Status: DC | PRN
  Administered 2013-10-11: 1000 mL via INTRAVESICAL

## 2013-10-11 MED ORDER — NEOSTIGMINE METHYLSULFATE 10 MG/10ML IV SOLN
INTRAVENOUS | Status: AC
  Filled 2013-10-11: qty 1

## 2013-10-11 MED ORDER — DEXAMETHASONE SODIUM PHOSPHATE 10 MG/ML IJ SOLN
INTRAMUSCULAR | Status: DC | PRN
  Administered 2013-10-11: 4 mg via INTRAVENOUS

## 2013-10-11 MED ORDER — GLYCOPYRROLATE 0.2 MG/ML IJ SOLN
INTRAMUSCULAR | Status: DC | PRN
  Administered 2013-10-11: 0.3 mg via INTRAVENOUS

## 2013-10-11 MED ORDER — NEOSTIGMINE METHYLSULFATE 10 MG/10ML IV SOLN
INTRAVENOUS | Status: DC | PRN
  Administered 2013-10-11: 2 mg via INTRAVENOUS

## 2013-10-11 MED ORDER — DIPHENHYDRAMINE HCL 12.5 MG/5ML PO ELIX
12.5000 mg | ORAL_SOLUTION | Freq: Four times a day (QID) | ORAL | Status: DC | PRN
  Filled 2013-10-11: qty 5

## 2013-10-11 MED ORDER — FENTANYL CITRATE 0.05 MG/ML IJ SOLN
INTRAMUSCULAR | Status: DC | PRN
  Administered 2013-10-11 (×5): 50 ug via INTRAVENOUS

## 2013-10-11 MED ORDER — HYDROMORPHONE 0.3 MG/ML IV SOLN
INTRAVENOUS | Status: DC
  Administered 2013-10-11: 2 mg via INTRAVENOUS
  Administered 2013-10-11: 4 mg via INTRAVENOUS
  Administered 2013-10-11: 0.999 mg via INTRAVENOUS
  Filled 2013-10-11: qty 25

## 2013-10-11 MED ORDER — LIDOCAINE-EPINEPHRINE 0.5 %-1:200000 IJ SOLN
INTRAMUSCULAR | Status: DC | PRN
  Administered 2013-10-11: 50 mL

## 2013-10-11 MED ORDER — CEFAZOLIN SODIUM-DEXTROSE 2-3 GM-% IV SOLR: 2.0000 g | INTRAVENOUS | Status: DC

## 2013-10-11 MED ORDER — NALOXONE HCL 0.4 MG/ML IJ SOLN: 0.4000 mg | INTRAMUSCULAR | Status: DC | PRN

## 2013-10-11 SURGICAL SUPPLY — 52 items
ADH SKN CLS APL DERMABOND .7 (GAUZE/BANDAGES/DRESSINGS) ×2
BLADE SURG 10 STRL SS (BLADE) ×3 IMPLANT
BLADE SURG 11 STRL SS (BLADE) ×6 IMPLANT
CATH ROBINSON RED A/P 16FR (CATHETERS) ×3 IMPLANT
CLOTH BEACON ORANGE TIMEOUT ST (SAFETY) ×3 IMPLANT
CONT PATH 16OZ SNAP LID 3702 (MISCELLANEOUS) ×3 IMPLANT
COVER TABLE BACK 60X90 (DRAPES) ×3 IMPLANT
DECANTER SPIKE VIAL GLASS SM (MISCELLANEOUS) ×3 IMPLANT
DERMABOND ADVANCED (GAUZE/BANDAGES/DRESSINGS) ×1
DERMABOND ADVANCED .7 DNX12 (GAUZE/BANDAGES/DRESSINGS) ×2 IMPLANT
DEVICE CAPIO SLIM SINGLE (INSTRUMENTS) ×1 IMPLANT
DRAPE HYSTEROSCOPY (DRAPE) ×3 IMPLANT
DRSG COVADERM PLUS 2X2 (GAUZE/BANDAGES/DRESSINGS) ×6 IMPLANT
DRSG OPSITE POSTOP 3X4 (GAUZE/BANDAGES/DRESSINGS) ×1 IMPLANT
DURAPREP 26ML APPLICATOR (WOUND CARE) ×3 IMPLANT
ELECT LIGASURE LONG (ELECTRODE) ×1 IMPLANT
ELECT REM PT RETURN 9FT ADLT (ELECTROSURGICAL) ×3
ELECTRODE REM PT RTRN 9FT ADLT (ELECTROSURGICAL) IMPLANT
FILTER SMOKE EVAC LAPAROSHD (FILTER) ×3 IMPLANT
GAUZE PACKING 2X5 YD STRL (GAUZE/BANDAGES/DRESSINGS) ×1 IMPLANT
GLOVE BIO SURGEON STRL SZ7.5 (GLOVE) ×6 IMPLANT
GLOVE BIOGEL PI IND STRL 6.5 (GLOVE) ×2 IMPLANT
GLOVE BIOGEL PI IND STRL 8 (GLOVE) ×2 IMPLANT
GLOVE BIOGEL PI INDICATOR 6.5 (GLOVE) ×1
GLOVE BIOGEL PI INDICATOR 8 (GLOVE) ×1
GOWN STRL REUS W/ TWL LRG LVL3 (GOWN DISPOSABLE) ×14 IMPLANT
GOWN STRL REUS W/TWL LRG LVL3 (GOWN DISPOSABLE) ×21
NEEDLE HYPO 22GX1.5 SAFETY (NEEDLE) ×3 IMPLANT
NEEDLE INSUFFLATION 120MM (ENDOMECHANICALS) ×3 IMPLANT
NEEDLE MAYO .5 CIRCLE (NEEDLE) IMPLANT
NS IRRIG 1000ML POUR BTL (IV SOLUTION) ×3 IMPLANT
PACK LAVH (CUSTOM PROCEDURE TRAY) ×3 IMPLANT
PACK VAGINAL WOMENS (CUSTOM PROCEDURE TRAY) ×3 IMPLANT
PROTECTOR NERVE ULNAR (MISCELLANEOUS) ×3 IMPLANT
SEALER TISSUE G2 CVD JAW 45CM (ENDOMECHANICALS) ×3 IMPLANT
SET CYSTO W/LG BORE CLAMP LF (SET/KITS/TRAYS/PACK) ×3 IMPLANT
SUT CAPIO POLYGLYCOLIC (SUTURE) ×1 IMPLANT
SUT MNCRL 0 MO-4 VIOLET 18 CR (SUTURE) ×6 IMPLANT
SUT MNCRL 0 VIOLET 6X18 (SUTURE) ×2 IMPLANT
SUT MON AB 2-0 CT1 27 (SUTURE) ×1 IMPLANT
SUT MONOCRYL 0 6X18 (SUTURE) ×1
SUT MONOCRYL 0 MO 4 18  CR/8 (SUTURE) ×3
SUT VIC AB 4-0 PS2 27 (SUTURE) ×3 IMPLANT
SUT VICRYL 0 UR6 27IN ABS (SUTURE) ×1 IMPLANT
SYR 30ML LL (SYRINGE) ×3 IMPLANT
SYRINGE 10CC LL (SYRINGE) ×3 IMPLANT
TOWEL OR 17X24 6PK STRL BLUE (TOWEL DISPOSABLE) ×6 IMPLANT
TRAY FOLEY CATH 14FR (SET/KITS/TRAYS/PACK) ×3 IMPLANT
TROCAR XCEL NON-BLD 11X100MML (ENDOMECHANICALS) ×3 IMPLANT
TROCAR XCEL NON-BLD 5MMX100MML (ENDOMECHANICALS) ×3 IMPLANT
WARMER LAPAROSCOPE (MISCELLANEOUS) ×3 IMPLANT
WATER STERILE IRR 1000ML POUR (IV SOLUTION) ×3 IMPLANT

## 2013-10-11 NOTE — Progress Notes (Signed)
UR completed 

## 2013-10-11 NOTE — Brief Op Note (Signed)
10/11/2013  9:25 AM  PATIENT:  Barbee Cough Legan  63 y.o. female  PRE-OPERATIVE DIAGNOSIS:  Pelvic Prolapse  POST-OPERATIVE DIAGNOSIS:  Pelvic Prolapse  PROCEDURE:  Laparoscopically assisted vaginal hysterectomy, bilateral salpingo oophorectomy, anterior and posterior vaginal repair  SURGEON:  Surgeon(s) and Role:    * Allena Katz, MD - Primary    * Linda Hedges, DO - Assisting  PHYSICIAN ASSISTANT:   ASSISTANTS: Morris   ANESTHESIA:   general  EBL:  Total I/O In: 1400 [I.V.:1400] Out: 550 [Urine:400; Blood:150]  BLOOD ADMINISTERED:none  DRAINS: Urinary Catheter (Foley)   LOCAL MEDICATIONS USED:  MARCAINE    and Amount: 30 ml  SPECIMEN:  Source of Specimen:  uterus , bialteral tubes and ovaries  DISPOSITION OF SPECIMEN:  PATHOLOGY  COUNTS:  YES  TOURNIQUET:  * No tourniquets in log *  DICTATION: .Other Dictation: Dictation Number [id number]  PLAN OF CARE: Admit for overnight observation  PATIENT DISPOSITION:  PACU - hemodynamically stable.   Delay start of Pharmacological VTE agent (>24hrs) due to surgical blood loss or risk of bleeding: not applicable

## 2013-10-11 NOTE — Transfer of Care (Signed)
Immediate Anesthesia Transfer of Care Note  Patient: [name] [name]  Procedure(s) Performed: Procedure(s): LAPAROSCOPIC ASSISTED VAGINAL HYSTERECTOMY BILATERAL SALPINGO OOPHORECTOMY (Bilateral) ANTERIOR (CYSTOCELE) AND POSTERIOR REPAIR (RECTOCELE) WITH SACROSPINOUS LIGAMENT SUSPENSION (N/A)  Patient Location: PACU  Anesthesia Type:General  Level of Consciousness: awake, alert , oriented and patient cooperative  Airway & Oxygen Therapy: Patient Spontanous Breathing and Patient connected to nasal cannula oxygen  Post-op Assessment: Report given to PACU RN and Post -op Vital signs reviewed and stable  Post vital signs: Reviewed and stable  Complications: No apparent anesthesia complications

## 2013-10-11 NOTE — Op Note (Signed)
NAME:  [name] [name], [name] [name] NO.:  [id number]  MEDICAL RECORD NO.:  [id number]  LOCATION:  [facility]                          FACILITY:  [facility]  PHYSICIAN:  Daleen Bo. Gaetano Net, M.D. DATE OF BIRTH:  [birth year]  DATE OF PROCEDURE:  10/11/2013 DATE OF DISCHARGE:                              OPERATIVE REPORT   PREOPERATIVE DIAGNOSIS:  Pelvic prolapse.  POSTOPERATIVE DIAGNOSIS:  Pelvic prolapse.  PROCEDURE:  Laparoscopically-assisted vaginal hysterectomy with bilateral salpingo-oophorectomy and anterior and posterior vaginal repair and cystoscopy.  SURGEON:  Daleen Bo. Gaetano Net, M.D.  ASSISTANT:  Lanna Poche, MD  ANESTHESIA:  General endotracheal intubation.  ESTIMATED BLOOD LOSS:  150 mL.  SPECIMENS:  Uterus, bilateral tubes and ovaries to Pathology.  INDICATIONS AND CONSENT:  This patient is a 63 year old patient with symptomatic pelvic prolapse.  Details are dictated in the history and physical.  Laparoscopically-assisted vaginal hysterectomy, bilateral salpingo-oophorectomy, anterior-posterior repair, and sacrospinous ligament suspension has been discussed preoperatively.  Potential risks and complications are discussed preoperatively including, but not limited to, infection, organ damage, bleeding requiring transfusion of blood products with HIV and hepatitis acquisition, DVT, PE, pneumonia, fistula formation, abdominal pain, pelvic pain, painful intercourse, recurrent prolapse, vulvar numbness or pain, return to the operating room, laparotomy.  All questions have been answered and consent is signed on the chart.  FINDINGS:  Upper abdomen is grossly normal.  Ovaries are normal bilaterally.  Anterior and posterior cul-de-sac is normal.  PROCEDURE:  The patient was taken to the operating room, where she was identified, placed in dorsal supine position and general anesthesia was induced via endotracheal intubation.  She was placed in a dorsal lithotomy position.  She  was prepped abdominally and vaginally.  Bladder was straight catheterized and Hulka tenaculum was placed in the uterus as a manipulator.  She was draped in a sterile fashion.  Time-out was undertaken.  The infraumbilical and suprapubic areas were injected in the midline with approximately 6 mL of 0.5% plain Marcaine.  Small infraumbilical incision was made and a disposable Veress needle was placed on the first attempt without difficulty.  A good syringe and drop test were noted.  2 L of gas were then insufflated under low pressure with good tympany in the right upper quadrant.  Veress needle was removed and a 10/11 XL bladeless disposable trocar sleeve was placed using direct visualization with the diagnostic laparoscope.  The operative scope was then used.  Small suprapubic incision was made in the midline and a 5-mm disposable trocar sleeve was placed under direct visualization without difficulty.  The above findings were noted.  Then, using the EnSeal bipolar cautery cutting instrument, the right infundibulopelvic ligaments were taken down, coming across to the round ligament down to the level of the vesicouterine peritoneum.  Similar procedure was carried out on the left.  Vesicouterine peritoneum was taken down cephalad laterally.  Suprapubic trocar sleeve was removed, instruments were removed, and attention was turned to the vagina. Posterior cul-de-sac was entered sharply and the cervix was circumscribed with unipolar cautery.  Mucosa was advanced sharply and bluntly.  Anterior cul-de-sac was entered without difficulty.  Using the LigaSure handheld bipolar cautery instrument, the uterosacral ligaments were taken down followed by the  bladder pillars, cardinal ligaments, and uterine vessels bilaterally.  Fundus was delivered posteriorly and the remaining pedicles were taken down, the specimen was delivered.  All suture will be 0-Monocryl unless otherwise designated.   Uterosacral ligaments were then plicated to the vaginal cuff bilaterally and then plicated to the midline with the third suture.  Posterior half of the cuff was closed with figure-of-eights.  Anterior vaginal mucosa was then injected with a dilute solution of 1% lidocaine with epinephrine.  The anterior vaginal mucosa was then taken down in the midline to a point about 3 cm from the urethral meatus.  It was then widely dissected sharply and bluntly to mobilize the bladder from the mucosa. Pursestring sutures were then used to elevate the bladder.  This gave good support.  Excess mucosa was trimmed.  0-Monocryl pops were used to close the anterior half of the vaginal cuff and a 2-0 running Monocryl suture was used in a locking fashion to close the anterior vaginal mucosa.  The posterior vaginal mucosa was then injected submucosally with a similar solution.  A very shallow diamond-shaped wedge of tissue was resected from the posterior perineal body with scissors.  The posterior vaginal mucosa was then dissected from the underlying rectum and the incision was carried up about two-thirds of the way along the posterior vaginal wall.  This was then dissected bilaterally, sharply, and bluntly.  Examination at that time revealed good support at the vaginal apex.  Decision was made to not do the sacrospinous ligament suspension.  The rectovaginal fascia was then reapproximated in the midline with interrupted sutures of 0-Monocryl.  Excess mucosa was trimmed, and the posterior vaginal mucosa was closed in a running, locking fashion with 2-0 Monocryl suture, which was carried down to close the vulva in standard episiotomy type fashion.  Cystoscopy with a 70-degree cystoscope was then carried out.  A 360-degree inspection reveals no evidence of perforation and no foreign bodies.  Fluorescein was injected intravenously and was noted to exit in a good puff from the ureters bilaterally.  Cystoscope was  removed.  Foley catheter was placed.  The vagina was packed with plain packing with Estrace vaginal cream.  Attention was returned to the abdomen.  Pneumoperitoneum was reintroduced and the suprapubic trocar sleeve was placed again under direct visualization.  Irrigation was carried out.  Very minor bleeding at peritoneal edges was controlled with bipolar cautery.  Excess fluid was removed.  The remaining approximately 27 mL of 0.5% plain Marcaine was then injected into the peritoneal cavity.  Suprapubic trocar sleeve was removed.  Pneumoperitoneum was reduced.  The umbilical trocar sleeve was removed.  The umbilical incision was closed with a 0-Vicryl suture in the subcutaneous tissue under good visualization.  Skin was closed on both incisions with interrupted 2-0 Vicryl suture.  Dermabond was applied.  All counts were correct.  The patient was awaken and taken to the recovery room in a stable condition.     Daleen Bo Gaetano Net, M.D.     JET/MEDQ  D:  10/11/2013  T:  10/11/2013  Job:  [id number]

## 2013-10-11 NOTE — Anesthesia Postprocedure Evaluation (Addendum)
  Anesthesia Post-op Note  Patient: [name] [name]  Procedure(s) Performed: Procedure(s): LAPAROSCOPIC ASSISTED VAGINAL HYSTERECTOMY BILATERAL SALPINGO OOPHORECTOMY (Bilateral) ANTERIOR (CYSTOCELE) AND POSTERIOR REPAIR (RECTOCELE) WITH SACROSPINOUS LIGAMENT SUSPENSION (N/A)  Patient Location: [facility]  Anesthesia Type:General  Level of Consciousness: awake, alert , oriented and patient cooperative  Airway and Oxygen Therapy: Patient Spontanous Breathing and Patient connected to nasal cannula oxygen  Post-op Pain: mild  Post-op Assessment: Post-op Vital signs reviewed, Patient's Cardiovascular Status Stable, Respiratory Function Stable and Patent Airway  Post-op Vital Signs: Reviewed and stable  Last Vitals:  Filed Vitals:   10/11/13 1430  BP: 100/58  Pulse: 67  Temp: 36.6 C  Resp: 18    Complications: No apparent anesthesia complications

## 2013-10-11 NOTE — Addendum Note (Signed)
Addendum created 10/11/13 1550 by Raenette Rover, CRNA   Modules edited: Notes Section   Notes Section:  File: [id number]; File: [id number]

## 2013-10-11 NOTE — Anesthesia Postprocedure Evaluation (Signed)
  Anesthesia Post-op Note  Anesthesia Post Note  Patient: [name] [name]  Procedure(s) Performed: Procedure(s) (LRB): LAPAROSCOPIC ASSISTED VAGINAL HYSTERECTOMY BILATERAL SALPINGO OOPHORECTOMY (Bilateral) ANTERIOR (CYSTOCELE) AND POSTERIOR REPAIR (RECTOCELE) WITH SACROSPINOUS LIGAMENT SUSPENSION (N/A)  Anesthesia type: General  Patient location: PACU  Post pain: Pain level controlled  Post assessment: Post-op Vital signs reviewed  Last Vitals:  Filed Vitals:   10/11/13 1100  BP:   Pulse: 64  Temp:   Resp: 16    Post vital signs: Reviewed  Level of consciousness: sedated  Complications: No apparent anesthesia complications

## 2013-10-11 NOTE — Progress Notes (Signed)
No changes to H&P per patient history Reviewed with patient procedure-LAVH/BSO, A&P repair, SSLS All questions answered

## 2013-10-11 NOTE — Progress Notes (Signed)
Good pain relief  VSS Afeb Lungs CTA Cor RRR Abd soft, BS + Ext PAS on  UO clear  A/P: Stable

## 2013-10-12 ENCOUNTER — Encounter (HOSPITAL_COMMUNITY): Payer: Self-pay | Admitting: Obstetrics and Gynecology

## 2013-10-12 DIAGNOSIS — N8189 Other female genital prolapse: Secondary | ICD-10-CM | POA: Diagnosis not present

## 2013-10-12 LAB — CBC
HEMATOCRIT: 35.3 % — AB (ref 36.0–46.0)
Hemoglobin: 11.7 g/dL — ABNORMAL LOW (ref 12.0–15.0)
MCH: 29.7 pg (ref 26.0–34.0)
MCHC: 33.1 g/dL (ref 30.0–36.0)
MCV: 89.6 fL (ref 78.0–100.0)
PLATELETS: 185 10*3/uL (ref 150–400)
RBC: 3.94 MIL/uL (ref 3.87–5.11)
RDW: 14 % (ref 11.5–15.5)
WBC: 11 10*3/uL — AB (ref 4.0–10.5)

## 2013-10-12 MED ORDER — ASPIRIN EC 81 MG PO TBEC: 81.0000 mg | DELAYED_RELEASE_TABLET | Freq: Every day | ORAL | Status: DC

## 2013-10-12 MED ORDER — IBUPROFEN 600 MG PO TABS: 600.0000 mg | ORAL_TABLET | Freq: Four times a day (QID) | ORAL | Status: DC | PRN

## 2013-10-12 MED ORDER — OXYCODONE-ACETAMINOPHEN 5-325 MG PO TABS: 1.0000 | ORAL_TABLET | Freq: Four times a day (QID) | ORAL | Status: DC | PRN

## 2013-10-12 NOTE — Progress Notes (Signed)
Pt. Is discharged in the care of husband. Downstairs per ambulatory with R.N. Judd Lien. Denies pain,heavy vaginalsbleeding,or incisions problems.Discharged instructions with Rx were given to pt. Questions were asked and answered. Stable.

## 2013-10-12 NOTE — Progress Notes (Signed)
Good pain relief, voiding, tolerating regular diet, passing flatus  VSS Afeb Abd soft, incisions OK  Results for orders placed during the hospital encounter of 10/11/13 (from the past 24 hour(s))  CBC     Status: Abnormal   Collection Time    10/12/13  5:30 AM      Result Value Ref Range   WBC 11.0 (*) 4.0 - 10.5 K/uL   RBC 3.94  3.87 - 5.11 MIL/uL   Hemoglobin 11.7 (*) 12.0 - 15.0 g/dL   HCT 35.3 (*) 36.0 - 46.0 %   MCV 89.6  78.0 - 100.0 fL   MCH 29.7  26.0 - 34.0 pg   MCHC 33.1  30.0 - 36.0 g/dL   RDW 14.0  11.5 - 15.5 %   Platelets 185  150 - 400 K/uL    A: Satisfactory  P: D/C home     Instructions given

## 2013-10-12 NOTE — Discharge Summary (Signed)
Physician Discharge Summary  Patient ID: [name] [name] MRN: [id number] DOB/AGE: [birth year] 65 y.o.  Admit date: 10/11/2013 Discharge date: 10/12/2013  Admission Diagnoses:Pelvic Prolapse  Discharge Diagnoses:  Active Problems:   Pelvic prolapse   Discharged Condition: good  Hospital Course: Good resumption of bowel/bladder function. Ambulating, passing flatus with good pain relief.  Consults: None  Significant Diagnostic Studies: labs:  Results for orders placed during the hospital encounter of 10/11/13 (from the past 24 hour(s))  CBC     Status: Abnormal   Collection Time    10/12/13  5:30 AM      Result Value Ref Range   WBC 11.0 (*) 4.0 - 10.5 K/uL   RBC 3.94  3.87 - 5.11 MIL/uL   Hemoglobin 11.7 (*) 12.0 - 15.0 g/dL   HCT 35.3 (*) 36.0 - 46.0 %   MCV 89.6  78.0 - 100.0 fL   MCH 29.7  26.0 - 34.0 pg   MCHC 33.1  30.0 - 36.0 g/dL   RDW 14.0  11.5 - 15.5 %   Platelets 185  150 - 400 K/uL    Treatments: surgery: LAVH/BSO, A&P repair  Discharge Exam: Blood pressure 105/43, pulse 60, temperature 97.7 F (36.5 C), temperature source Oral, resp. rate 12, height 5\' 5"  (1.651 m), weight 78.472 kg (173 lb), SpO2 95.00%. General appearance: alert, cooperative and no distress GI: soft, non-tender; bowel sounds normal; no masses,  no organomegaly  Disposition: Final discharge disposition not confirmed     Medication List    STOP taking these medications       carboxymethylcellulose 0.5 % Soln  Commonly known as:  REFRESH PLUS     estradiol 0.1 MG/GM vaginal cream  Commonly known as:  ESTRACE     minocycline 50 MG capsule  Commonly known as:  MINOCIN,DYNACIN      TAKE these medications       aspirin EC 81 MG tablet  Take 1 tablet (81 mg total) by mouth daily.     bisacodyl 5 MG EC tablet  Commonly known as:  DULCOLAX  Take 5 mg by mouth at bedtime.     cetirizine 10 MG tablet  Commonly known as:  ZYRTEC  Take 10 mg by mouth daily.     ibuprofen  600 MG tablet  Commonly known as:  ADVIL,MOTRIN  Take 1 tablet (600 mg total) by mouth every 6 (six) hours as needed (mild pain).     nortriptyline 10 MG capsule  Commonly known as:  PAMELOR  Take 10 mg by mouth at bedtime as needed for sleep.     omeprazole 20 MG capsule  Commonly known as:  PRILOSEC  Take 20 mg by mouth at bedtime.     oxyCODONE-acetaminophen 5-325 MG per tablet  Commonly known as:  PERCOCET/ROXICET  Take 1-2 tablets by mouth every 6 (six) hours as needed (moderate to severe pain (when tolerating fluids)).     vitamin C 1000 MG tablet  Take 1,000 mg by mouth daily. Powder that she mixes in water         Signed: Favio Moder II,Aum Caggiano E 10/12/2013, 8:12 AM

## 2014-01-11 ENCOUNTER — Other Ambulatory Visit (HOSPITAL_BASED_OUTPATIENT_CLINIC_OR_DEPARTMENT_OTHER): Payer: Self-pay | Admitting: Specialist

## 2014-01-15 ENCOUNTER — Encounter (HOSPITAL_BASED_OUTPATIENT_CLINIC_OR_DEPARTMENT_OTHER): Payer: Self-pay

## 2014-01-15 ENCOUNTER — Ambulatory Visit (HOSPITAL_BASED_OUTPATIENT_CLINIC_OR_DEPARTMENT_OTHER): Admit: 2014-01-15 | Payer: Self-pay | Admitting: Specialist

## 2014-01-15 SURGERY — CARPAL TUNNEL RELEASE
Anesthesia: Choice | Laterality: Right

## 2014-01-31 ENCOUNTER — Encounter (HOSPITAL_BASED_OUTPATIENT_CLINIC_OR_DEPARTMENT_OTHER): Payer: Self-pay | Admitting: *Deleted

## 2014-01-31 NOTE — Progress Notes (Signed)
No labs needed-had hyst 10/15-did well

## 2014-02-04 ENCOUNTER — Other Ambulatory Visit (HOSPITAL_BASED_OUTPATIENT_CLINIC_OR_DEPARTMENT_OTHER): Payer: Self-pay | Admitting: Specialist

## 2014-02-05 ENCOUNTER — Encounter (HOSPITAL_BASED_OUTPATIENT_CLINIC_OR_DEPARTMENT_OTHER)
Admission: RE | Disposition: A | Discharge status: Home / Self Care | Payer: Self-pay | Source: Ambulatory Visit | Attending: Specialist

## 2014-02-05 ENCOUNTER — Ambulatory Visit (HOSPITAL_BASED_OUTPATIENT_CLINIC_OR_DEPARTMENT_OTHER): Payer: Medicare Other | Admitting: Anesthesiology

## 2014-02-05 ENCOUNTER — Ambulatory Visit (HOSPITAL_BASED_OUTPATIENT_CLINIC_OR_DEPARTMENT_OTHER)
Admission: RE | Admit: 2014-02-05 | Discharge: 2014-02-05 | Disposition: A | Discharge status: Home / Self Care | Payer: Medicare Other | Source: Ambulatory Visit | Attending: Specialist | Admitting: Specialist

## 2014-02-05 ENCOUNTER — Encounter (HOSPITAL_BASED_OUTPATIENT_CLINIC_OR_DEPARTMENT_OTHER): Payer: Self-pay | Admitting: *Deleted

## 2014-02-05 DIAGNOSIS — K219 Gastro-esophageal reflux disease without esophagitis: Secondary | ICD-10-CM | POA: Diagnosis not present

## 2014-02-05 DIAGNOSIS — G5601 Carpal tunnel syndrome, right upper limb: Secondary | ICD-10-CM | POA: Diagnosis present

## 2014-02-05 DIAGNOSIS — Z791 Long term (current) use of non-steroidal anti-inflammatories (NSAID): Secondary | ICD-10-CM | POA: Insufficient documentation

## 2014-02-05 DIAGNOSIS — Z87891 Personal history of nicotine dependence: Secondary | ICD-10-CM | POA: Diagnosis not present

## 2014-02-05 DIAGNOSIS — Z7982 Long term (current) use of aspirin: Secondary | ICD-10-CM | POA: Diagnosis not present

## 2014-02-05 DIAGNOSIS — M13849 Other specified arthritis, unspecified hand: Secondary | ICD-10-CM | POA: Insufficient documentation

## 2014-02-05 HISTORY — PX: CARPAL TUNNEL RELEASE: SHX101

## 2014-02-05 LAB — POCT HEMOGLOBIN-HEMACUE: HEMOGLOBIN: 12.8 g/dL (ref 12.0–15.0)

## 2014-02-05 SURGERY — CARPAL TUNNEL RELEASE
Anesthesia: Monitor Anesthesia Care | Site: Wrist | Laterality: Right

## 2014-02-05 MED ORDER — BISACODYL 10 MG RE SUPP: 10.0000 mg | Freq: Every day | RECTAL | Status: DC | PRN

## 2014-02-05 MED ORDER — PROPOFOL INFUSION 10 MG/ML OPTIME
INTRAVENOUS | Status: DC | PRN
  Administered 2014-02-05: 25 ug/kg/min via INTRAVENOUS

## 2014-02-05 MED ORDER — LACTATED RINGERS IV SOLN
INTRAVENOUS | Status: DC | PRN
  Administered 2014-02-05: 07:00:00 via INTRAVENOUS

## 2014-02-05 MED ORDER — FLEET ENEMA 7-19 GM/118ML RE ENEM: 1.0000 | ENEMA | Freq: Once | RECTAL | Status: DC | PRN

## 2014-02-05 MED ORDER — PROMETHAZINE HCL 25 MG/ML IJ SOLN: 6.2500 mg | INTRAMUSCULAR | Status: DC | PRN

## 2014-02-05 MED ORDER — MORPHINE SULFATE 2 MG/ML IJ SOLN: 1.0000 mg | INTRAMUSCULAR | Status: DC | PRN

## 2014-02-05 MED ORDER — OXYCODONE HCL 5 MG/5ML PO SOLN: 5.0000 mg | Freq: Once | ORAL | Status: DC | PRN

## 2014-02-05 MED ORDER — OXYCODONE-ACETAMINOPHEN 5-325 MG PO TABS: 1.0000 | ORAL_TABLET | ORAL | Status: DC | PRN

## 2014-02-05 MED ORDER — FENTANYL CITRATE 0.05 MG/ML IJ SOLN: 50.0000 ug | INTRAMUSCULAR | Status: DC | PRN

## 2014-02-05 MED ORDER — HYDROMORPHONE HCL 1 MG/ML IJ SOLN: 0.2500 mg | INTRAMUSCULAR | Status: DC | PRN

## 2014-02-05 MED ORDER — KETOROLAC TROMETHAMINE 30 MG/ML IJ SOLN
INTRAMUSCULAR | Status: DC | PRN
  Administered 2014-02-05: 15 mg via INTRAVENOUS

## 2014-02-05 MED ORDER — LIDOCAINE HCL (PF) 0.5 % IJ SOLN
INTRAMUSCULAR | Status: DC | PRN
  Administered 2014-02-05: 50 mL via INTRAVENOUS

## 2014-02-05 MED ORDER — METOCLOPRAMIDE HCL 5 MG PO TABS
5.0000 mg | ORAL_TABLET | Freq: Three times a day (TID) | ORAL | Status: DC | PRN
Start: 2014-02-05 — End: 2014-02-05

## 2014-02-05 MED ORDER — ONDANSETRON HCL 4 MG/2ML IJ SOLN: 4.0000 mg | Freq: Four times a day (QID) | INTRAMUSCULAR | Status: DC | PRN

## 2014-02-05 MED ORDER — MIDAZOLAM HCL 2 MG/2ML IJ SOLN
INTRAMUSCULAR | Status: AC
  Filled 2014-02-05: qty 2

## 2014-02-05 MED ORDER — PANTOPRAZOLE SODIUM 40 MG PO TBEC: 40.0000 mg | DELAYED_RELEASE_TABLET | Freq: Every day | ORAL | Status: DC

## 2014-02-05 MED ORDER — MIDAZOLAM HCL 2 MG/2ML IJ SOLN: 1.0000 mg | INTRAMUSCULAR | Status: DC | PRN

## 2014-02-05 MED ORDER — DEXAMETHASONE SODIUM PHOSPHATE 4 MG/ML IJ SOLN
INTRAMUSCULAR | Status: DC | PRN
  Administered 2014-02-05: 4 mg via INTRAVENOUS

## 2014-02-05 MED ORDER — SODIUM CHLORIDE 0.9 % IV SOLN: INTRAVENOUS | Status: DC

## 2014-02-05 MED ORDER — BUPIVACAINE HCL (PF) 0.5 % IJ SOLN
INTRAMUSCULAR | Status: DC | PRN
  Administered 2014-02-05: 15 mL

## 2014-02-05 MED ORDER — ONDANSETRON HCL 4 MG/2ML IJ SOLN
INTRAMUSCULAR | Status: DC | PRN
  Administered 2014-02-05: 4 mg via INTRAVENOUS

## 2014-02-05 MED ORDER — LACTATED RINGERS IV SOLN
INTRAVENOUS | Status: DC
  Administered 2014-02-05: 07:00:00 via INTRAVENOUS

## 2014-02-05 MED ORDER — HYDROCODONE-ACETAMINOPHEN 5-325 MG PO TABS: 1.0000 | ORAL_TABLET | ORAL | Status: DC | PRN

## 2014-02-05 MED ORDER — DOCUSATE SODIUM 100 MG PO CAPS: 100.0000 mg | ORAL_CAPSULE | Freq: Two times a day (BID) | ORAL | Status: DC

## 2014-02-05 MED ORDER — IBUPROFEN 600 MG PO TABS: 600.0000 mg | ORAL_TABLET | Freq: Four times a day (QID) | ORAL | Status: DC | PRN

## 2014-02-05 MED ORDER — LORATADINE 10 MG PO TABS: 10.0000 mg | ORAL_TABLET | Freq: Every day | ORAL | Status: DC

## 2014-02-05 MED ORDER — FENTANYL CITRATE 0.05 MG/ML IJ SOLN
INTRAMUSCULAR | Status: AC
  Filled 2014-02-05: qty 4

## 2014-02-05 MED ORDER — BUPIVACAINE HCL (PF) 0.5 % IJ SOLN
INTRAMUSCULAR | Status: AC
Start: 2014-02-05 — End: 2014-02-05
  Filled 2014-02-05: qty 30

## 2014-02-05 MED ORDER — FENTANYL CITRATE 0.05 MG/ML IJ SOLN
INTRAMUSCULAR | Status: DC | PRN
  Administered 2014-02-05 (×2): 25 ug via INTRAVENOUS

## 2014-02-05 MED ORDER — OXYCODONE HCL 5 MG PO TABS
5.0000 mg | ORAL_TABLET | Freq: Once | ORAL | Status: DC | PRN
Start: 2014-02-05 — End: 2014-02-05

## 2014-02-05 MED ORDER — MIDAZOLAM HCL 5 MG/5ML IJ SOLN
INTRAMUSCULAR | Status: DC | PRN
  Administered 2014-02-05 (×4): 0.5 mg via INTRAVENOUS

## 2014-02-05 MED ORDER — ONDANSETRON HCL 4 MG PO TABS: 4.0000 mg | ORAL_TABLET | Freq: Four times a day (QID) | ORAL | Status: DC | PRN

## 2014-02-05 MED ORDER — HYDROCODONE-ACETAMINOPHEN 5-325 MG PO TABS: 1.0000 | ORAL_TABLET | Freq: Four times a day (QID) | ORAL | Status: DC | PRN

## 2014-02-05 MED ORDER — METOCLOPRAMIDE HCL 5 MG/ML IJ SOLN: 5.0000 mg | Freq: Three times a day (TID) | INTRAMUSCULAR | Status: DC | PRN

## 2014-02-05 MED ORDER — POLYETHYLENE GLYCOL 3350 17 G PO PACK
17.0000 g | PACK | Freq: Every day | ORAL | Status: DC | PRN
Start: 2014-02-05 — End: 2014-02-05

## 2014-02-05 SURGICAL SUPPLY — 45 items
APL SKNCLS STERI-STRIP NONHPOA (GAUZE/BANDAGES/DRESSINGS)
BANDAGE ELASTIC 3 VELCRO ST LF (GAUZE/BANDAGES/DRESSINGS) ×2 IMPLANT
BENZOIN TINCTURE PRP APPL 2/3 (GAUZE/BANDAGES/DRESSINGS) IMPLANT
BLADE SURG 15 STRL LF DISP TIS (BLADE) ×1 IMPLANT
BLADE SURG 15 STRL SS (BLADE) ×2
BNDG CMPR 9X4 STRL LF SNTH (GAUZE/BANDAGES/DRESSINGS)
BNDG ESMARK 4X9 LF (GAUZE/BANDAGES/DRESSINGS) IMPLANT
CORDS BIPOLAR (ELECTRODE) ×2 IMPLANT
COVER BACK TABLE 60X90IN (DRAPES) ×2 IMPLANT
COVER MAYO STAND STRL (DRAPES) ×2 IMPLANT
CUFF TOURNIQUET SINGLE 18IN (TOURNIQUET CUFF) ×2 IMPLANT
DRAPE EXTREMITY T 121X128X90 (DRAPE) ×2 IMPLANT
DRAPE SURG 17X23 STRL (DRAPES) ×2 IMPLANT
DRSG EMULSION OIL 3X3 NADH (GAUZE/BANDAGES/DRESSINGS) ×2 IMPLANT
DURAPREP 26ML APPLICATOR (WOUND CARE) ×2 IMPLANT
ELECT REM PT RETURN 9FT ADLT (ELECTROSURGICAL)
ELECTRODE REM PT RTRN 9FT ADLT (ELECTROSURGICAL) IMPLANT
GAUZE SPONGE 4X4 12PLY STRL (GAUZE/BANDAGES/DRESSINGS) ×2 IMPLANT
GLOVE BIO SURGEON STRL SZ7 (GLOVE) ×1 IMPLANT
GLOVE BIOGEL PI IND STRL 9 (GLOVE) ×1 IMPLANT
GLOVE BIOGEL PI INDICATOR 9 (GLOVE) ×1
GLOVE ECLIPSE 8.5 STRL (GLOVE) ×2 IMPLANT
GOWN STRL REUS W/ TWL LRG LVL3 (GOWN DISPOSABLE) ×1 IMPLANT
GOWN STRL REUS W/ TWL XL LVL3 (GOWN DISPOSABLE) IMPLANT
GOWN STRL REUS W/TWL 2XL LVL3 (GOWN DISPOSABLE) ×2 IMPLANT
GOWN STRL REUS W/TWL LRG LVL3 (GOWN DISPOSABLE)
GOWN STRL REUS W/TWL XL LVL3 (GOWN DISPOSABLE) ×2
NEEDLE HYPO 22GX1.5 SAFETY (NEEDLE) ×1 IMPLANT
PACK BASIN DAY SURGERY FS (CUSTOM PROCEDURE TRAY) ×2 IMPLANT
PAD CAST 3X4 CTTN HI CHSV (CAST SUPPLIES) ×1 IMPLANT
PADDING CAST ABS 4INX4YD NS (CAST SUPPLIES) ×1
PADDING CAST ABS COTTON 4X4 ST (CAST SUPPLIES) ×1 IMPLANT
PADDING CAST COTTON 3X4 STRL (CAST SUPPLIES) ×2
PENCIL BUTTON HOLSTER BLD 10FT (ELECTRODE) IMPLANT
RUBBERBAND STERILE (MISCELLANEOUS) IMPLANT
SPLINT PLASTER CAST XFAST 3X15 (CAST SUPPLIES) ×10 IMPLANT
SPLINT PLASTER XTRA FASTSET 3X (CAST SUPPLIES) ×5
SPONGE GAUZE 4X4 12PLY STER LF (GAUZE/BANDAGES/DRESSINGS) IMPLANT
STOCKINETTE 4X48 STRL (DRAPES) ×2 IMPLANT
STRIP CLOSURE SKIN 1/2X4 (GAUZE/BANDAGES/DRESSINGS) IMPLANT
SUT ETHILON 4 0 PS 2 18 (SUTURE) ×2 IMPLANT
SUT VIC AB 3-0 FS2 27 (SUTURE) IMPLANT
SYR BULB 3OZ (MISCELLANEOUS) ×1 IMPLANT
SYR CONTROL 10ML LL (SYRINGE) ×1 IMPLANT
TOWEL OR 17X24 6PK STRL BLUE (TOWEL DISPOSABLE) ×2 IMPLANT

## 2014-02-05 NOTE — Anesthesia Postprocedure Evaluation (Signed)
  Anesthesia Post-op Note  Patient: [name] [name]  Procedure(s) Performed: Procedure(s): RIGHT CARPAL TUNNEL RELEASE (Right)  Patient Location: PACU  Anesthesia Type:MAC and Bier block  Level of Consciousness: awake and alert   Airway and Oxygen Therapy: Patient Spontanous Breathing  Post-op Pain: none  Post-op Assessment: Post-op Vital signs reviewed  Post-op Vital Signs: Reviewed  Last Vitals:  Filed Vitals:   02/05/14 0926  BP: 104/87  Pulse: 67  Temp:   Resp: 17    Complications: No apparent anesthesia complications

## 2014-02-05 NOTE — Anesthesia Preprocedure Evaluation (Addendum)
Anesthesia Evaluation  Patient identified by MRN, date of birth, ID band Patient awake    Reviewed: Allergy & Precautions, NPO status , Patient's Chart, lab work & pertinent test results  Airway Mallampati: III  TM Distance: >3 FB Neck ROM: Full    Dental  (+) Teeth Intact   Pulmonary former smoker,          Cardiovascular negative cardio ROS      Neuro/Psych negative neurological ROS  negative psych ROS   GI/Hepatic Neg liver ROS, GERD-  ,  Endo/Other  negative endocrine ROS  Renal/GU negative Renal ROS     Musculoskeletal  (+) Arthritis -,   Abdominal   Peds  Hematology negative hematology ROS (+)   Anesthesia Other Findings   Reproductive/Obstetrics                            Anesthesia Physical Anesthesia Plan  ASA: I  Anesthesia Plan: MAC and Bier Block   Post-op Pain Management:    Induction: Intravenous  Airway Management Planned: Simple Face Mask and Natural Airway  Additional Equipment:   Intra-op Plan:   Post-operative Plan:   Informed Consent: I have reviewed the patients History and Physical, chart, labs and discussed the procedure including the risks, benefits and alternatives for the proposed anesthesia with the patient or authorized representative who has indicated his/her understanding and acceptance.     Plan Discussed with: CRNA  Anesthesia Plan Comments:         Anesthesia Quick Evaluation

## 2014-02-05 NOTE — Discharge Instructions (Signed)

## 2014-02-05 NOTE — Anesthesia Procedure Notes (Signed)
Procedure Name: MAC Date/Time: 02/05/2014 7:38 AM Performed by: Justice Rocher Pre-anesthesia Checklist: Patient identified, Timeout performed, Emergency Drugs available, Suction available and Patient being monitored Patient Re-evaluated:Patient Re-evaluated prior to inductionOxygen Delivery Method: Simple face mask Preoxygenation: Pre-oxygenation with 100% oxygen Intubation Type: IV induction Placement Confirmation: positive ETCO2

## 2014-02-05 NOTE — H&P (Signed)
[name] [name] is an 64 y.o. female.   Chief Complaint:Right hand greater than left hand numbness and paresthesias HPI: 64 year old female with greater than 6 month history of bilateral hand numbness and paresthesias. She has been experiencing pain and numbness into both hands right hand greater than left. The numbness is mainly into the radial 3 fingers of the right hand it awakens her at night with the need to shake the hand trying to relieve numbness and tingling. Becoming more frequent and more constant. She was seen in September 2015 and underwent evaluation positive Tinel sign positive Phalen sign left and right wrists at the carpal tunnel. She underwent EMGs nerve conduction studies which demonstrated severe right median nerve entrapment at the wrist. She does not desire intervention in regards to the left wrist as it is not at all uncomfortable compare the right. Her surgery was delayed due to recent OB/GYN procedure at [facility] which proceeded uneventfully. She presents today to undergo a right open carpal tunnel release. She understands the risks and benefits of undergoing procedure. She notes that her symptoms are no better or no worse since her last visit in our office.  Past Medical History  Diagnosis Date  . GERD (gastroesophageal reflux disease)   . Seasonal allergies   . Constipation   . SVD (spontaneous vaginal delivery)     x 2  . Varicose vein   . Arthritis     hands    Past Surgical History  Procedure Laterality Date  . Tubal ligation    . Trigger thumb surgery      right  . Malignant melanoma resected      lower back area  . Eye surgery      Bilateral Lasik  . Varicose vein surgery      leg  . Colonoscopy    . Laparoscopic assisted vaginal hysterectomy Bilateral 10/11/2013    Procedure: LAPAROSCOPIC ASSISTED VAGINAL HYSTERECTOMY BILATERAL SALPINGO OOPHORECTOMY;  Surgeon: Allena Katz, MD;  Location: [facility];  Service: Gynecology;  Laterality:  Bilateral;    History reviewed. No pertinent family history. Social History:  reports that she quit smoking about 23 years ago. Her smoking use included Cigarettes. She has a 10 pack-year smoking history. She has never used smokeless tobacco. She reports that she does not drink alcohol or use illicit drugs.  Allergies: No Known Allergies  Medications Prior to Admission  Medication Sig Dispense Refill  . cetirizine (ZYRTEC) 10 MG tablet Take 10 mg by mouth daily.    Marland Kitchen ibuprofen (ADVIL,MOTRIN) 600 MG tablet Take 1 tablet (600 mg total) by mouth every 6 (six) hours as needed (mild pain). 30 tablet 0  . omeprazole (PRILOSEC) 20 MG capsule Take 20 mg by mouth at bedtime.    Marland Kitchen aspirin EC 81 MG tablet Take 1 tablet (81 mg total) by mouth daily. 30 tablet 3    Results for orders placed or performed during the hospital encounter of 02/05/14 (from the past 48 hour(s))  Hemoglobin-hemacue, POC     Status: None   Collection Time: 02/05/14  7:08 AM  Result Value Ref Range   Hemoglobin 12.8 12.0 - 15.0 g/dL   No results found.  Review of Systems  Constitutional: Negative for fever, chills, weight loss, malaise/fatigue and diaphoresis.  HENT: Negative for congestion, ear discharge, ear pain, hearing loss, nosebleeds, sore throat and tinnitus.   Eyes: Negative for blurred vision, double vision, photophobia, pain, discharge and redness.  Respiratory: Negative for  cough, hemoptysis, sputum production, shortness of breath, wheezing and stridor.   Cardiovascular: Negative for chest pain, palpitations, orthopnea, claudication and leg swelling.  Gastrointestinal: Negative for heartburn, nausea, vomiting, abdominal pain, diarrhea, constipation, blood in stool and melena.  Genitourinary: Negative for dysuria, urgency, frequency, hematuria and flank pain.  Musculoskeletal: Negative for myalgias, back pain, joint pain, falls and neck pain.  Skin: Negative for itching and rash.  Neurological: Positive for  tingling and sensory change. Negative for dizziness, tremors, speech change, focal weakness, seizures, weakness and headaches.  Endo/Heme/Allergies: Negative for environmental allergies and polydipsia. Does not bruise/bleed easily.  Psychiatric/Behavioral: Negative for depression, suicidal ideas, hallucinations, memory loss and substance abuse. The patient is not nervous/anxious and does not have insomnia.     Blood pressure 143/92, pulse 75, temperature 98 F (36.7 C), temperature source Oral, resp. rate 16, height 5\' 5"  (1.651 m), weight 79.606 kg (175 lb 8 oz), SpO2 98 %. Physical Exam  Constitutional: She is oriented to person, place, and time. She appears well-developed and well-nourished. No distress.  HENT:  Head: Normocephalic and atraumatic.  Right Ear: External ear normal.  Left Ear: External ear normal.  Nose: Nose normal.  Mouth/Throat: Oropharynx is clear and moist. No oropharyngeal exudate.  Eyes: Conjunctivae and EOM are normal. Pupils are equal, round, and reactive to light. Right eye exhibits no discharge. Left eye exhibits no discharge. No scleral icterus.  Neck: Normal range of motion. Neck supple. No JVD present. No tracheal deviation present. No thyromegaly present.  Cardiovascular: Normal rate, regular rhythm, normal heart sounds and intact distal pulses.  Exam reveals no gallop and no friction rub.   No murmur heard. Respiratory: Effort normal and breath sounds normal. No stridor. No respiratory distress. She has no wheezes. She has no rales. She exhibits no tenderness.  GI: Soft. Bowel sounds are normal. She exhibits no distension and no mass. There is no tenderness. There is no rebound and no guarding.  Musculoskeletal: Normal range of motion. She exhibits no edema or tenderness.  Lymphadenopathy:    She has no cervical adenopathy.  Neurological: She is alert and oriented to person, place, and time. She has normal reflexes. She displays normal reflexes. No cranial  nerve deficit. She exhibits normal muscle tone. Coordination normal.  Skin: Skin is warm and dry. No rash noted. She is not diaphoretic. No erythema. No pallor.  Psychiatric: She has a normal mood and affect. Her behavior is normal. Judgment and thought content normal.   orthopedic evaluation: 64 year old female who appears stated age she is a well-developed well-nourished oriented 4. Right hand shows positive Tinel's sign at transverse carpal ligament. Positive Phalen's sign at 5 seconds for the right hand. Numbness paresthesias into the thumb index long finger and radial aspect of the ring finger. Ulcerative reverse table in sign. EMGs nerve conduction studies indicating severe carpal tunnel neuroma right hand was delayed conduction delay at amplitude of the signal across the right transverse carpal ligament the median nerve. Both sensory and motor involvement.  Assessment/Plan Severe right greater than left carpal tunnel syndrome. Both motor and sensory involvement. Recent to OB/GYN surgery successful and has recovered. GERD History of triggering finger of the thumb  Plan: Patient is is ending today to undergo an open right carpal tunnel release or standing the risks and benefits of undergoing procedure and need for using a splint for a total of 3 weeks she'll be seen back for removal of sutures at 2 weeks postop.  Meggin Ola E 02/05/2014, 7:25  AM    

## 2014-02-05 NOTE — Op Note (Addendum)
02/05/2014  8:32 AM  PATIENT:  [name] [name]  64 y.o. female  MRN: [id number]  PRE-OPERATIVE DIAGNOSIS:  right carpal tunnel syndrome  POST-OPERATIVE DIAGNOSIS:  right carpal tunnel syndrome  PROCEDURE:  Procedure(s): RIGHT CARPAL TUNNEL RELEASE   OPERATIVE REPORT     SURGEON:  Jessy Oto, MD     ANESTHESIA:  Regional Bier Block Right Mid Forearm Level, Supplemented with local Marcaine 0.5 % 15cc Dr. Lorrene Reid.    COMPLICATIONS:  None.     TOURNIQUET TIME: 25 minutes at  223mmHg  FINDINGS: Right median nerve entrapment at the level of the transverse carpal ligament, median artery discontinued at the level of the proximal aspect of the transverse carpal ligament, restored filling post release of the transverse carpal ligament.   PROCEDURE: The patient was met in the holding area, and the appropriate right wrist identified and marked with "x" and my initials.The patient was then transported to OR and was placed on the operative table in a supine position. The patient was then placed under Bier block anesthesia without difficulty. The patient received appropriate preoperative antibiotic prophylaxis.     The right upper extremity was then prepped using sterile conditions and draped using sterile technique.  Time-out procedure was called and correct  .Using loope magnification and head lamp a 1.5 inch incision curved at the wrist crease with 15 blade scalpel.  Incision through skin and subcutaneous tissue to the volar forearm fascia and  transverse carpal ligament. Fascia then carefully lifed and incised with Stevens scissors inline with the fourth digit. The skin and subcutaneous tissue retracted and the volar fascia divided under direct vision from distal to proximal. A freer elevator then carefully placed between the median nerve and the transverse carpal ligament protecting the  median nerve as the transverse carpal ligament was divided with a 15 blade scalpel in line with  the fourth digit. Retracting with a sewell retractor the distal skin and subcutaneous tissues distally under direct visualization the remaining portions of the transverse carpal ligament were divided with iridectomy scissors again in line with the fourth digit. The palmar fascia was then divided until the traversing superficial palmar arch was identified and preserved intact.  The motor branch of the median nerve was carefully examined and identified intact. Tourniquet was then released. Bleeding controlled with bipolar electrocautery. The incision was then irrigated with copious amounts of irrigant solution, No active bleeding was present. The incision closed with a single layer skin closure of 4-0 nylon horizontal mattress sutures. Dry dressing of adaptic, 4x4s held in place with sterile webril.  A well padded volar splint applied with ace wrap.  The patient reactivated and returned to the PACU in good condition.  All instruments and sponge counts were correct.          Zamarah Ullmer E  02/05/2014, 8:32 AM

## 2014-02-05 NOTE — Transfer of Care (Signed)
Immediate Anesthesia Transfer of Care Note  Patient: [name] [name]  Procedure(s) Performed: Procedure(s) (LRB): RIGHT CARPAL TUNNEL RELEASE (Right)  Patient Location: PACU  Anesthesia Type: MAC with Bier Block   Level of Consciousness: awake, sedated, patient cooperative and responds to stimulation  Airway & Oxygen Therapy: Patient Spontanous Breathing and Patient connected to face mask oxygen  Post-op Assessment: Report given to PACU RN, Post -op Vital signs reviewed and stable and Patient moving all extremities  Post vital signs: Reviewed and stable  Complications: No apparent anesthesia complications

## 2014-02-05 NOTE — Interval H&P Note (Signed)
History and Physical Interval Note:  02/05/2014 7:39 AM  [name] [name]  has presented today for surgery, with the diagnosis of right carpal tunnel syndrome  The various methods of treatment have been discussed with the patient and family. After consideration of risks, benefits and other options for treatment, the patient has consented to  Procedure(s): RIGHT CARPAL TUNNEL RELEASE (Right) as a surgical intervention .  The patient's history has been reviewed, patient examined, no change in status, stable for surgery.  I have reviewed the patient's chart and labs.  Questions were answered to the patient's satisfaction.     Shaia Porath E

## 2014-02-05 NOTE — Brief Op Note (Signed)
02/05/2014  8:30 AM  PATIENT:  Barbee Cough Mcnelly  64 y.o. female  PRE-OPERATIVE DIAGNOSIS:  right carpal tunnel syndrome  POST-OPERATIVE DIAGNOSIS:  right carpal tunnel syndrome  PROCEDURE:  Procedure(s): RIGHT CARPAL TUNNEL RELEASE (Right)  SURGEON:  Surgeon(s) and Role:    * Jessy Oto, MD - Primary  ANESTHESIA:   local and regional,Dr. Al Corpus  BLOOD ADMINISTERED:none  DRAINS: none   LOCAL MEDICATIONS USED:  MARCAINE    and Amount: 15 ml  SPECIMEN:  No Specimen  DISPOSITION OF SPECIMEN:  N/A  COUNTS:  YES  TOURNIQUET:   Total Tourniquet Time Documented: Forearm (Right) - 25 minutes Total: Forearm (Right) - 25 minutes   DICTATION: .Viviann Spare Dictation  PLAN OF CARE: Discharge to home after PACU  PATIENT DISPOSITION:  PACU - hemodynamically stable.   Delay start of Pharmacological VTE agent (>24hrs) due to surgical blood loss or risk of bleeding: yes

## 2014-02-06 ENCOUNTER — Encounter (HOSPITAL_BASED_OUTPATIENT_CLINIC_OR_DEPARTMENT_OTHER): Payer: Self-pay | Admitting: Specialist

## 2014-06-17 ENCOUNTER — Encounter: Payer: Self-pay | Admitting: *Deleted

## 2014-06-19 ENCOUNTER — Ambulatory Visit (INDEPENDENT_AMBULATORY_CARE_PROVIDER_SITE_OTHER): Payer: Medicare Other | Admitting: Cardiology

## 2014-06-19 ENCOUNTER — Encounter: Payer: Self-pay | Admitting: Cardiology

## 2014-06-19 VITALS — BP 144/78 | HR 81 | Ht 65.0 in | Wt 170.5 lb

## 2014-06-19 DIAGNOSIS — R002 Palpitations: Secondary | ICD-10-CM | POA: Diagnosis not present

## 2014-06-19 DIAGNOSIS — R0600 Dyspnea, unspecified: Secondary | ICD-10-CM

## 2014-06-19 DIAGNOSIS — Z79899 Other long term (current) drug therapy: Secondary | ICD-10-CM | POA: Diagnosis not present

## 2014-06-19 DIAGNOSIS — I10 Essential (primary) hypertension: Secondary | ICD-10-CM | POA: Diagnosis not present

## 2014-06-19 NOTE — Patient Instructions (Addendum)
Your physician wants you to follow-up in: 2 Months  You will receive a reminder letter in the mail two months in advance. If you don't receive a letter, please call our office to schedule the follow-up appointment.  Your physician has requested that you have an exercise tolerance test. For further information please visit [email or web address]. Please also follow instruction sheet, as given. [employer]  Your physician recommends that you return for lab work BNP, TSH, BMP  Your physician recommends that you have a CT Cardiac Scoring, this is done at [facility] office

## 2014-06-19 NOTE — Progress Notes (Signed)
Cardiology Office Note   Date:  06/19/2014   ID:  Jaida, Basurto [birth year], MRN [id number]  PCP:  Florina Ou, MD  Cardiologist:   Minus Breeding, MD   Chief Complaint  Patient presents with  . Palpitations      History of Present Illness: RASHEEDAH REIS is a 64 y.o. female who presents for evaluation of palpitations. I saw her several years ago. She had a negative stress test in 2010. She's had some ectopy previously. However, she's having increasing palpitations. This is happening frequently at night. She feels skipped beats. She does not describe rapid rates. She does not have any presyncope or syncope although she feels a little bit lightheaded with this. She denies any chest pressure, neck or arm discomfort. She has had no weight gain or edema. She remains active and does get some dyspnea with exertion. This has been increasing. There is a vague chest discomfort. This is sporadic. She does not have neck or arm discomfort. This seems to be mild. Is not always reproducible. She has a very strong family history of CAD.    Past Medical History  Diagnosis Date  . GERD (gastroesophageal reflux disease)   . Seasonal allergies   . Asthma     x 2  . Varicose vein   . Arthritis     hands    Past Surgical History  Procedure Laterality Date  . Tubal ligation    . Trigger thumb surgery      right  . Malignant melanoma resected      lower back area  . Eye surgery      Bilateral Lasik  . Varicose vein surgery      leg  . Colonoscopy    . Laparoscopic assisted vaginal hysterectomy Bilateral 10/11/2013    Procedure: LAPAROSCOPIC ASSISTED VAGINAL HYSTERECTOMY BILATERAL SALPINGO OOPHORECTOMY;  Surgeon: Allena Katz, MD;  Location: [facility];  Service: Gynecology;  Laterality: Bilateral;  . Carpal tunnel release Right 02/05/2014    Procedure: RIGHT CARPAL TUNNEL RELEASE;  Surgeon: Jessy Oto, MD;  Location: [facility];  Service: Orthopedics;   Laterality: Right;     Current Outpatient Prescriptions  Medication Sig Dispense Refill  . aspirin EC 81 MG tablet Take 1 tablet (81 mg total) by mouth daily. 30 tablet 3  . cetirizine (ZYRTEC) 10 MG tablet Take 10 mg by mouth daily.    . minocycline (MINOCIN,DYNACIN) 100 MG capsule Take 1 capsule by mouth daily.    Marland Kitchen omeprazole (PRILOSEC) 20 MG capsule Take 20 mg by mouth at bedtime.     No current facility-administered medications for this visit.    Allergies:   Review of patient's allergies indicates no known allergies.    Social History:  The patient  reports that she quit smoking about 23 years ago. Her smoking use included Cigarettes. She has a 10 pack-year smoking history. She has never used smokeless tobacco. She reports that she does not drink alcohol or use illicit drugs.   Family History:  The patient's family history includes CAD in her mother; Diabetes (age of onset: 48) in her mother; Heart attack (age of onset: 28) in her sister; Heart attack (age of onset: 48) in her father; Heart failure in her mother; Heart failure (age of onset: 71) in her brother.    ROS:  Please see the history of present illness.   Otherwise, review of systems are positive for constipation.   All other systems  are reviewed and negative.    PHYSICAL EXAM: VS:  BP 144/78 mmHg  Pulse 81  Ht 5\' 5"  (1.651 m)  Wt 170 lb 8 oz (77.338 kg)  BMI 28.37 kg/m2 , BMI Body mass index is 28.37 kg/(m^2). GENERAL:  Well appearing HEENT:  Pupils equal round and reactive, fundi not visualized, oral mucosa unremarkable NECK:  No jugular venous distention, waveform within normal limits, carotid upstroke brisk and symmetric, no bruits, no thyromegaly LYMPHATICS:  No cervical, inguinal adenopathy LUNGS:  Clear to auscultation bilaterally BACK:  No CVA tenderness CHEST:  Unremarkable HEART:  PMI not displaced or sustained,S1 and S2 within normal limits, no S3, no S4, no clicks, no rubs, no murmurs ABD:  Flat,  positive bowel sounds normal in frequency in pitch, no bruits, no rebound, no guarding, no midline pulsatile mass, no hepatomegaly, no splenomegaly EXT:  2 plus pulses throughout, no edema, no cyanosis no clubbing SKIN:  No rashes no nodules NEURO:  Cranial nerves II through XII grossly intact, motor grossly intact throughout PSYCH:  Cognitively intact, oriented to person place and time    EKG:  EKG is ordered today. The ekg ordered today demonstrates sinus rhythm, rate 81, interventricular conduction delay, premature ectopic complex, poor anterior R wave progression, no acute ST-T wave changes.   Recent Labs: 10/09/2013: ALT 27; BUN 12; Creatinine, Ser 0.72; Potassium 3.9; Sodium 142 10/12/2013: Platelets 185 02/05/2014: Hemoglobin 12.8    Lipid Panel No results found for: CHOL, TRIG, HDL, CHOLHDL, VLDL, LDLCALC, LDLDIRECT    Wt Readings from Last 3 Encounters:  06/19/14 170 lb 8 oz (77.338 kg)  02/05/14 175 lb 8 oz (79.606 kg)  10/11/13 173 lb (78.472 kg)      Other studies Reviewed: Additional studies/ records that were reviewed today include: Previous stress test. Review of the above records demonstrates:  Please see elsewhere in the note.     ASSESSMENT AND PLAN:  Palpitations:  The patient's describing having PVCs or PACs. We discussed potential symptomatic management of this but she would like to try to avoid medications. She drinks little caffeine she will hold off on that.  Family History of CAD:  The patient will have stress testing for evaluation of this along with her dyspnea and vague chest discomfort. She will also have a coronary calcium score.   Current medicines are reviewed at length with the patient today.  The patient does not have concerns regarding medicines.  The following changes have been made:  no change  Labs/ tests ordered today include:   Orders Placed This Encounter  Procedures  . CT Cardiac Scoring  . B Nat Peptide  . Basic Metabolic Panel  (BMET)  . TSH  . EKG 12-Lead     Disposition:   FU with me in 2 months    Signed, Minus Breeding, MD  06/19/2014 4:58 PM    [facility]

## 2014-06-20 ENCOUNTER — Encounter: Payer: Self-pay | Admitting: Cardiology

## 2014-06-27 ENCOUNTER — Other Ambulatory Visit: Payer: Medicare Other

## 2014-07-05 ENCOUNTER — Ambulatory Visit (INDEPENDENT_AMBULATORY_CARE_PROVIDER_SITE_OTHER)
Admission: RE | Admit: 2014-07-05 | Discharge: 2014-07-05 | Disposition: A | Discharge status: Home / Self Care | Payer: Medicare Other | Source: Ambulatory Visit | Attending: Cardiology | Admitting: Cardiology

## 2014-07-05 ENCOUNTER — Ambulatory Visit (INDEPENDENT_AMBULATORY_CARE_PROVIDER_SITE_OTHER): Payer: Medicare Other | Admitting: Physician Assistant

## 2014-07-05 DIAGNOSIS — R0600 Dyspnea, unspecified: Secondary | ICD-10-CM

## 2014-07-05 DIAGNOSIS — R002 Palpitations: Secondary | ICD-10-CM

## 2014-07-05 DIAGNOSIS — I10 Essential (primary) hypertension: Secondary | ICD-10-CM | POA: Diagnosis not present

## 2014-07-05 LAB — EXERCISE TOLERANCE TEST
CHL CUP RESTING HR STRESS: 64 {beats}/min
CSEPEW: 5.7 METS
CSEPHR: 93 %
CSEPPHR: 144 {beats}/min
Exercise duration (min): 3 min
Exercise duration (sec): 57 s
MPHR: 155 {beats}/min
RPE: 15

## 2014-07-24 LAB — BASIC METABOLIC PANEL
BUN: 9 mg/dL (ref 6–23)
CO2: 25 mEq/L (ref 19–32)
Calcium: 11.1 mg/dL — ABNORMAL HIGH (ref 8.4–10.5)
Chloride: 108 mEq/L (ref 96–112)
Creat: 0.72 mg/dL (ref 0.50–1.10)
Glucose, Bld: 83 mg/dL (ref 70–99)
Potassium: 4.1 mEq/L (ref 3.5–5.3)
Sodium: 139 mEq/L (ref 135–145)

## 2014-07-24 LAB — TSH: TSH: 0.945 u[IU]/mL (ref 0.350–4.500)

## 2014-07-24 LAB — BRAIN NATRIURETIC PEPTIDE: Brain Natriuretic Peptide: 27.3 pg/mL (ref 0.0–100.0)

## 2014-08-27 ENCOUNTER — Ambulatory Visit: Payer: Medicare Other | Admitting: Cardiology

## 2014-08-27 ENCOUNTER — Ambulatory Visit (INDEPENDENT_AMBULATORY_CARE_PROVIDER_SITE_OTHER): Payer: Medicare Other | Admitting: Cardiology

## 2014-08-27 ENCOUNTER — Encounter: Payer: Self-pay | Admitting: Cardiology

## 2014-08-27 VITALS — BP 136/82 | HR 59 | Ht 65.0 in | Wt 168.4 lb

## 2014-08-27 DIAGNOSIS — R072 Precordial pain: Secondary | ICD-10-CM

## 2014-08-27 MED ORDER — DILTIAZEM HCL 30 MG PO TABS: 30.0000 mg | ORAL_TABLET | Freq: Every day | ORAL | Status: DC | PRN

## 2014-08-27 NOTE — Progress Notes (Signed)
Cardiology Office Note   Date:  08/27/2014   ID:  [name] [name], [name] [name] 12/21/1950, MRN [id number]  PCP:  Florina Ou, MD  Cardiologist:   Minus Breeding, MD   No chief complaint on file.     History of Present Illness: [name] [name] is a 64 y.o. female who presents for evaluation of palpitations.  I saw her recently for this and for evaluation of a strong family history of early coronary disease. She had a zero coronary calcium score area she had some chronic stable lung nodules. POET (Plain Old Exercise Treadmill) was negative.  Continues to have some palpitations. These are sporadic and at night. She's not had any presyncope or syncope. She's not having any chest pressure, neck or arm discomfort. She's had no weight gain or edema.   Past Medical History  Diagnosis Date  . GERD (gastroesophageal reflux disease)   . Seasonal allergies   . Asthma     x 2  . Varicose vein   . Arthritis     hands    Past Surgical History  Procedure Laterality Date  . Tubal ligation    . Trigger thumb surgery      right  . Malignant melanoma resected      lower back area  . Eye surgery      Bilateral Lasik  . Varicose vein surgery      leg  . Colonoscopy    . Laparoscopic assisted vaginal hysterectomy Bilateral 10/11/2013    Procedure: LAPAROSCOPIC ASSISTED VAGINAL HYSTERECTOMY BILATERAL SALPINGO OOPHORECTOMY;  Surgeon: Allena Katz, MD;  Location: [facility];  Service: Gynecology;  Laterality: Bilateral;  . Carpal tunnel release Right 02/05/2014    Procedure: RIGHT CARPAL TUNNEL RELEASE;  Surgeon: Jessy Oto, MD;  Location: [facility];  Service: Orthopedics;  Laterality: Right;     Current Outpatient Prescriptions  Medication Sig Dispense Refill  . aspirin EC 81 MG tablet Take 1 tablet (81 mg total) by mouth daily. 30 tablet 3  . cetirizine (ZYRTEC) 10 MG tablet Take 10 mg by mouth daily.    . minocycline (MINOCIN,DYNACIN) 100 MG capsule Take 1 capsule  by mouth daily.    Marland Kitchen omeprazole (PRILOSEC) 20 MG capsule Take 20 mg by mouth at bedtime.     No current facility-administered medications for this visit.    Allergies:   Review of patient's allergies indicates no known allergies.    ROS:  Please see the history of present illness.   Otherwise, review of systems are positive for constipation.   All other systems are reviewed and negative.    PHYSICAL EXAM: VS:  BP 136/82 mmHg  Pulse 59  Ht 5\' 5"  (1.651 m)  Wt 168 lb 7 oz (76.403 kg)  BMI 28.03 kg/m2 , BMI Body mass index is 28.03 kg/(m^2). GENERAL:  Well appearing NECK:  No jugular venous distention, waveform within normal limits, carotid upstroke brisk and symmetric, no bruits, no thyromegaly LUNGS:  Clear to auscultation bilaterally CHEST:  Unremarkable HEART:  PMI not displaced or sustained,S1 and S2 within normal limits, no S3, no S4, no clicks, no rubs, no murmurs ABD:  Flat, positive bowel sounds normal in frequency in pitch, no bruits, no rebound, no guarding, no midline pulsatile mass, no hepatomegaly, no splenomegaly EXT:  2 plus pulses throughout, no edema, no cyanosis no clubbing   EKG:  EKG is not ordered today.   Recent Labs: 10/09/2013: ALT 27 10/12/2013: Platelets 185 02/05/2014:  Hemoglobin 12.8 07/23/2014: BUN 9; Creat 0.72; Potassium 4.1; Sodium 139; TSH 0.945    Lipid Panel No results found for: CHOL, TRIG, HDL, CHOLHDL, VLDL, LDLCALC, LDLDIRECT    Wt Readings from Last 3 Encounters:  08/27/14 168 lb 7 oz (76.403 kg)  06/19/14 170 lb 8 oz (77.338 kg)  02/05/14 175 lb 8 oz (79.606 kg)      Other studies Reviewed: Additional studies/ records that were reviewed today include: Previous stress test. Review of the above records demonstrates:  Please see elsewhere in the note.     ASSESSMENT AND PLAN:  Palpitations:  The patient's palpitations are sporadic. I will give her Cardizem CD 30 mg when necessary and she'll let me know if these get worse. Again I  advised her to cut out caffeine.   Current medicines are reviewed at length with the patient today.  The patient does not have concerns regarding medicines.  The following changes have been made:  no change  Labs/ tests ordered today include:  None     Disposition:   FU with me as needed   Signed, Minus Breeding, MD  08/27/2014 10:20 AM    [place]

## 2014-08-27 NOTE — Patient Instructions (Signed)
Your physician recommends that you schedule a follow-up appointment in: As Needed  Your physician has recommended you make the following change in your medication: START Cardizem 30 mg as needed

## 2016-02-05 DIAGNOSIS — Z01419 Encounter for gynecological examination (general) (routine) without abnormal findings: Secondary | ICD-10-CM | POA: Diagnosis not present

## 2016-02-05 DIAGNOSIS — Z124 Encounter for screening for malignant neoplasm of cervix: Secondary | ICD-10-CM | POA: Diagnosis not present

## 2016-02-05 DIAGNOSIS — Z1231 Encounter for screening mammogram for malignant neoplasm of breast: Secondary | ICD-10-CM | POA: Diagnosis not present

## 2016-02-06 ENCOUNTER — Other Ambulatory Visit: Payer: Self-pay | Admitting: Obstetrics and Gynecology

## 2016-02-06 DIAGNOSIS — R928 Other abnormal and inconclusive findings on diagnostic imaging of breast: Secondary | ICD-10-CM

## 2016-02-11 DIAGNOSIS — L718 Other rosacea: Secondary | ICD-10-CM | POA: Diagnosis not present

## 2016-02-11 DIAGNOSIS — J301 Allergic rhinitis due to pollen: Secondary | ICD-10-CM | POA: Diagnosis not present

## 2016-02-11 DIAGNOSIS — K219 Gastro-esophageal reflux disease without esophagitis: Secondary | ICD-10-CM | POA: Diagnosis not present

## 2016-02-11 DIAGNOSIS — Z23 Encounter for immunization: Secondary | ICD-10-CM | POA: Diagnosis not present

## 2016-02-24 ENCOUNTER — Ambulatory Visit
Admission: RE | Admit: 2016-02-24 | Discharge: 2016-02-24 | Disposition: A | Discharge status: Home / Self Care | Payer: Medicare Other | Source: Ambulatory Visit | Attending: Obstetrics and Gynecology | Admitting: Obstetrics and Gynecology

## 2016-02-24 DIAGNOSIS — R928 Other abnormal and inconclusive findings on diagnostic imaging of breast: Secondary | ICD-10-CM | POA: Diagnosis not present

## 2016-02-24 DIAGNOSIS — M8588 Other specified disorders of bone density and structure, other site: Secondary | ICD-10-CM | POA: Diagnosis not present

## 2016-02-24 DIAGNOSIS — N958 Other specified menopausal and perimenopausal disorders: Secondary | ICD-10-CM | POA: Diagnosis not present

## 2016-02-24 DIAGNOSIS — Z13820 Encounter for screening for osteoporosis: Secondary | ICD-10-CM | POA: Diagnosis not present

## 2016-06-08 DIAGNOSIS — L718 Other rosacea: Secondary | ICD-10-CM | POA: Diagnosis not present

## 2016-06-08 DIAGNOSIS — K219 Gastro-esophageal reflux disease without esophagitis: Secondary | ICD-10-CM | POA: Diagnosis not present

## 2016-06-10 DIAGNOSIS — L718 Other rosacea: Secondary | ICD-10-CM | POA: Diagnosis not present

## 2016-06-10 DIAGNOSIS — K219 Gastro-esophageal reflux disease without esophagitis: Secondary | ICD-10-CM | POA: Diagnosis not present

## 2016-06-10 DIAGNOSIS — Z1389 Encounter for screening for other disorder: Secondary | ICD-10-CM | POA: Diagnosis not present

## 2016-06-10 DIAGNOSIS — Z23 Encounter for immunization: Secondary | ICD-10-CM | POA: Diagnosis not present

## 2016-06-10 DIAGNOSIS — J301 Allergic rhinitis due to pollen: Secondary | ICD-10-CM | POA: Diagnosis not present

## 2016-06-16 DIAGNOSIS — Z9189 Other specified personal risk factors, not elsewhere classified: Secondary | ICD-10-CM | POA: Diagnosis not present

## 2016-06-16 DIAGNOSIS — K219 Gastro-esophageal reflux disease without esophagitis: Secondary | ICD-10-CM | POA: Diagnosis not present

## 2016-06-29 DIAGNOSIS — R7989 Other specified abnormal findings of blood chemistry: Secondary | ICD-10-CM | POA: Diagnosis not present

## 2016-06-29 DIAGNOSIS — R938 Abnormal findings on diagnostic imaging of other specified body structures: Secondary | ICD-10-CM | POA: Diagnosis not present

## 2016-08-09 ENCOUNTER — Ambulatory Visit: Payer: Self-pay | Admitting: Surgery

## 2016-08-09 DIAGNOSIS — E210 Primary hyperparathyroidism: Secondary | ICD-10-CM | POA: Diagnosis not present

## 2016-08-18 NOTE — Pre-Procedure Instructions (Signed)
[facility]  08/18/2016      [id number] - [place], [place] - [address] [place] [address] Phone: [phone] Fax: [phone]  [facility] [address] [place] [address] [place] [address] Phone: [phone] Fax: [phone]    Your procedure is scheduled on Tuesday August 21.  Report to [facility] Admitting at 12:45 A.M.  Call this number if you have problems the morning of surgery:  [phone]   Remember:  Do not eat food or drink liquids after midnight.  Take these medicines the morning of surgery with A SIP OF WATER: cetirizine (zyrtec), eye drops  7 days prior to surgery STOP taking any Aspirin, Aleve, Naproxen, Ibuprofen, Motrin, Advil, Goody's, BC's, all herbal medications, fish oil, and all vitamins    Do not wear jewelry, make-up or nail polish.  Do not wear lotions, powders, or perfumes, or deoderant.  Do not shave 48 hours prior to surgery.  Men may shave face and neck.  Do not bring valuables to the hospital.  [facility] is not responsible for any belongings or valuables.  Contacts, dentures or bridgework may not be worn into surgery.  Leave your suitcase in the car.  After surgery it may be brought to your room.  For patients admitted to the hospital, discharge time will be determined by your treatment team.  Patients discharged the day of surgery will not be allowed to drive home.    Special instructions:    [place]- Preparing For Surgery  Before surgery, you can play an important role. Because skin is not sterile, your skin needs to be as free of germs as possible. You can reduce the number of germs on your skin by washing with CHG (chlorahexidine gluconate) Soap before surgery.  CHG is an antiseptic cleaner which kills germs and bonds with the skin to continue killing germs even after washing.  Please do not use if you have an allergy to CHG or  antibacterial soaps. If your skin becomes reddened/irritated stop using the CHG.  Do not shave (including legs and underarms) for at least 48 hours prior to first CHG shower. It is OK to shave your face.  Please follow these instructions carefully.   1. Shower the NIGHT BEFORE SURGERY and the MORNING OF SURGERY with CHG.   2. If you chose to wash your hair, wash your hair first as usual with your normal shampoo.  3. After you shampoo, rinse your hair and body thoroughly to remove the shampoo.  4. Use CHG as you would any other liquid soap. You can apply CHG directly to the skin and wash gently with a scrungie or a clean washcloth.   5. Apply the CHG Soap to your body ONLY FROM THE NECK DOWN.  Do not use on open wounds or open sores. Avoid contact with your eyes, ears, mouth and genitals (private parts). Wash genitals (private parts) with your normal soap.  6. Wash thoroughly, paying special attention to the area where your surgery will be performed.  7. Thoroughly rinse your body with warm water from the neck down.  8. DO NOT shower/wash with your normal soap after using and rinsing off the CHG Soap.  9. Pat yourself dry with a CLEAN TOWEL.   10. Wear CLEAN PAJAMAS   11. Place CLEAN SHEETS on your bed the night of your first shower and DO NOT SLEEP WITH PETS.  Day of Surgery: Do not apply any deodorants/lotions. Please wear clean clothes to the hospital/surgery center.

## 2016-08-19 ENCOUNTER — Encounter (HOSPITAL_COMMUNITY)
Admission: RE | Admit: 2016-08-19 | Discharge: 2016-08-19 | Disposition: A | Discharge status: Home / Self Care | Payer: Medicare Other | Source: Ambulatory Visit | Attending: Surgery | Admitting: Surgery

## 2016-08-19 ENCOUNTER — Encounter (HOSPITAL_COMMUNITY): Payer: Self-pay

## 2016-08-19 DIAGNOSIS — Z01812 Encounter for preprocedural laboratory examination: Secondary | ICD-10-CM | POA: Diagnosis not present

## 2016-08-19 HISTORY — DX: Malignant (primary) neoplasm, unspecified: C80.1

## 2016-08-19 HISTORY — DX: Personal history of other specified conditions: Z87.898

## 2016-08-19 HISTORY — DX: Anxiety disorder, unspecified: F41.9

## 2016-08-19 LAB — CBC
HCT: 36.5 % (ref 36.0–46.0)
HEMOGLOBIN: 12.1 g/dL (ref 12.0–15.0)
MCH: 28.5 pg (ref 26.0–34.0)
MCHC: 33.2 g/dL (ref 30.0–36.0)
MCV: 85.9 fL (ref 78.0–100.0)
Platelets: 210 10*3/uL (ref 150–400)
RBC: 4.25 MIL/uL (ref 3.87–5.11)
RDW: 14.3 % (ref 11.5–15.5)
WBC: 6.6 10*3/uL (ref 4.0–10.5)

## 2016-08-19 MED ORDER — CHLORHEXIDINE GLUCONATE CLOTH 2 % EX PADS: 6.0000 | MEDICATED_PAD | Freq: Once | CUTANEOUS | Status: DC

## 2016-08-19 NOTE — Progress Notes (Signed)
PCP - Gar Ponto Cardiologist - pt saw Dr. Percival Spanish in the past for palpitations, pt reports no problems since then, she is no longer on any medicine for this.   Stress Test - 07/05/14  Patient denies shortness of breath, fever, cough and chest pain at PAT appointment  Patient verbalized understanding of instructions that were given to them at the PAT appointment. Patient was also instructed that they will need to review over the PAT instructions again at home before surgery.

## 2016-08-23 ENCOUNTER — Encounter (HOSPITAL_COMMUNITY): Payer: Self-pay | Admitting: Surgery

## 2016-08-23 DIAGNOSIS — E210 Primary hyperparathyroidism: Secondary | ICD-10-CM | POA: Diagnosis present

## 2016-08-23 MED ORDER — CEFAZOLIN SODIUM-DEXTROSE 2-4 GM/100ML-% IV SOLN
2.0000 g | INTRAVENOUS | Status: AC
  Administered 2016-08-24: 2 g via INTRAVENOUS
  Filled 2016-08-23: qty 100

## 2016-08-23 NOTE — H&P (Signed)
General Surgery [facility]  [name] DOB: Aug 28, 1950 Married / Language: English / Race: White Female  History of Present Illness   The patient is a 66 year old female who presents with primary hyperparathyroidism.  CC: primary hyperparathyroidism  Patient is referred by Dr. Gar Ponto for evaluation and management of primary hyperparathyroidism. Patient was noted on routine laboratory studies to have an elevated serum calcium level. Repeat levels remained elevated with a range of 10.9-11.4. Intact PTH level was elevated at 88. Patient underwent nuclear medicine parathyroid scan on June 29, 2016. This localized an abnormal focus of increased activity at the left inferior position consistent with parathyroid adenoma. Patient has had no prior history of endocrine disease. She has recently noted increased fatigue. She has noted mood swings particularly towards depression. She has had a bone density scan and has osteopenia. There is no family history of parathyroid disease. She does have a sister with a history of hyperthyroidism. Patient has had no prior head or neck surgery. She presents today accompanied by her husband.   Past Surgical History  Colon Polyp Removal - Colonoscopy  Hysterectomy (not due to cancer) - Complete   Diagnostic Studies History Colonoscopy  within last year Mammogram  within last year Pap Smear  1-5 years ago  Allergies No Known Drug Allergies 08/09/2016 Allergies Reconciled   Medication History Minocycline HCl (100MG  Capsule, Oral) Active. Cetirizine HCl (10MG  Tablet, Oral) Active. Omeprazole (20MG  Capsule DR, Oral) Active. Aciphex (20MG  Tablet DR, Oral) Active. Aspirin (81MG  Capsule, Oral) Active. Medications Reconciled  Social History Alcohol use  Remotely quit alcohol use. No caffeine use  No drug use  Tobacco use  Former smoker.  Family History Alcohol Abuse  Father, Son. Cervical  Cancer  Sister. Colon Polyps  Sister. Diabetes Mellitus  Brother, Father, Mother, Sister, Son. Heart Disease  Brother, Father, Mother, Sister. Heart disease in female family member before age 40  Heart disease in female family member before age 62  Thyroid problems  Sister.  Pregnancy / Birth History Age at menarche  13 years. Age of menopause  51-55 Contraceptive History  Oral contraceptives. Gravida  2 Maternal age  34-20 Para  2  Other Problems  Anxiety Disorder  Arthritis  Asthma  Back Pain  Bladder Problems  Gastroesophageal Reflux Disease  Hemorrhoids  Melanoma    Review of Systems General Present- Fatigue and Night Sweats. Not Present- Appetite Loss, Chills, Fever, Weight Gain and Weight Loss. Skin Present- Dryness. Not Present- Change in Wart/Mole, Hives, Jaundice, New Lesions, Non-Healing Wounds, Rash and Ulcer. HEENT Present- Ringing in the Ears, Seasonal Allergies, Sinus Pain, Sore Throat and Wears glasses/contact lenses. Not Present- Earache, Hearing Loss, Hoarseness, Nose Bleed, Oral Ulcers, Visual Disturbances and Yellow Eyes. Respiratory Present- Chronic Cough and Wheezing. Not Present- Bloody sputum, Difficulty Breathing and Snoring. Breast Not Present- Breast Mass, Breast Pain, Nipple Discharge and Skin Changes. Cardiovascular Present- Leg Cramps and Swelling of Extremities. Not Present- Chest Pain, Difficulty Breathing Lying Down, Palpitations, Rapid Heart Rate and Shortness of Breath. Gastrointestinal Present- Constipation and Hemorrhoids. Not Present- Abdominal Pain, Bloating, Bloody Stool, Change in Bowel Habits, Chronic diarrhea, Difficulty Swallowing, Excessive gas, Gets full quickly at meals, Indigestion, Nausea, Rectal Pain and Vomiting. Female Genitourinary Present- Frequency. Not Present- Nocturia, Painful Urination, Pelvic Pain and Urgency. Musculoskeletal Present- Back Pain, Joint Pain, Joint Stiffness and Muscle Pain. Not Present-  Muscle Weakness and Swelling of Extremities. Neurological Present- Decreased Memory. Not Present- Fainting, Headaches, Numbness, Seizures, Tingling,  Tremor, Trouble walking and Weakness. Psychiatric Present- Anxiety. Not Present- Bipolar, Change in Sleep Pattern, Depression, Fearful and Frequent crying. Endocrine Present- Hot flashes. Not Present- Cold Intolerance, Excessive Hunger, Hair Changes, Heat Intolerance and New Diabetes. Hematology Not Present- Blood Thinners, Easy Bruising, Excessive bleeding, Gland problems, HIV and Persistent Infections.  Vitals Weight: 161.4 lb Height: 64in Body Surface Area: 1.79 m Body Mass Index: 27.7 kg/m  Temp.: 98.7F  Pulse: 77 (Regular)  BP: 120/82 (Sitting, Left Arm, Standard)   Physical Exa The physical exam findings are as follows: Note:CONSTITUTIONAL See vital signs recorded above  GENERAL APPEARANCE Development: normal Nutritional status: normal Gross deformities: none  SKIN Rash, lesions, ulcers: none Induration, erythema: none Nodules: none palpable  EYES Conjunctiva and lids: normal Pupils: equal and reactive Iris: normal bilaterally  EARS, NOSE, MOUTH, THROAT External ears: no lesion or deformity External nose: no lesion or deformity Hearing: grossly normal Lips: no lesion or deformity Dentition: normal for age Oral mucosa: moist  NECK Symmetric: yes Trachea: midline Thyroid: no palpable nodules in the thyroid bed  CHEST Respiratory effort: normal Retraction or accessory muscle use: no Breath sounds: normal bilaterally Rales, rhonchi, wheeze: none  CARDIOVASCULAR Auscultation: regular rhythm, normal rate Murmurs: none Pulses: carotid and radial pulse 2+ palpable Lower extremity edema: Bilateral trace edema Lower extremity varicosities: none  MUSCULOSKELETAL Station and gait: normal Digits and nails: no clubbing or cyanosis Muscle strength: grossly normal all extremities Range of motion:  grossly normal all extremities Deformity: none  LYMPHATIC Cervical: none palpable Supraclavicular: none palpable  PSYCHIATRIC Oriented to person, place, and time: yes Mood and affect: normal for situation Judgment and insight: appropriate for situation    Assessment & Plan  PRIMARY HYPERPARATHYROIDISM (E21.0)  Pt Education - Pamphlet Given - The Parathyroid Surgery Book: discussed with patient and provided information.  Patient presents with primary hyperparathyroidism and diagnostic studies localizing a parathyroid adenoma to the left inferior position.  Patient and her husband are provided with written literature on parathyroid surgery to review at home.  I have recommended proceeding with left inferior parathyroidectomy using a minimally invasive approach. This can be performed as an outpatient surgery. We have discussed the location of the surgical incision, the use of general anesthesia, the risk of recurrent laryngeal nerve injury or the potential need for additional surgery in the case of multiple gland involvement. Patient and her husband understand and wish to proceed with surgery in the near future.  The risks and benefits of the procedure have been discussed at length with the patient. The patient understands the proposed procedure, potential alternative treatments, and the course of recovery to be expected. All of the patient's questions have been answered at this time. The patient wishes to proceed with surgery.  Earnstine Regal, MD, [facility] Office: [phone]

## 2016-08-24 ENCOUNTER — Encounter (HOSPITAL_COMMUNITY)
Admission: RE | Disposition: A | Discharge status: Home / Self Care | Payer: Self-pay | Source: Ambulatory Visit | Attending: Surgery

## 2016-08-24 ENCOUNTER — Encounter (HOSPITAL_COMMUNITY): Payer: Self-pay | Admitting: Certified Registered"

## 2016-08-24 ENCOUNTER — Ambulatory Visit (HOSPITAL_COMMUNITY): Payer: Medicare Other | Admitting: Emergency Medicine

## 2016-08-24 ENCOUNTER — Ambulatory Visit (HOSPITAL_COMMUNITY): Payer: Medicare Other | Admitting: Certified Registered"

## 2016-08-24 ENCOUNTER — Observation Stay (HOSPITAL_COMMUNITY)
Admission: RE | Admit: 2016-08-24 | Discharge: 2016-08-25 | Disposition: A | Discharge status: Home / Self Care | Payer: Medicare Other | Source: Ambulatory Visit | Attending: Surgery | Admitting: Surgery

## 2016-08-24 DIAGNOSIS — Z86010 Personal history of colonic polyps: Secondary | ICD-10-CM | POA: Insufficient documentation

## 2016-08-24 DIAGNOSIS — K219 Gastro-esophageal reflux disease without esophagitis: Secondary | ICD-10-CM | POA: Diagnosis not present

## 2016-08-24 DIAGNOSIS — F419 Anxiety disorder, unspecified: Secondary | ICD-10-CM | POA: Insufficient documentation

## 2016-08-24 DIAGNOSIS — F329 Major depressive disorder, single episode, unspecified: Secondary | ICD-10-CM | POA: Insufficient documentation

## 2016-08-24 DIAGNOSIS — Z79899 Other long term (current) drug therapy: Secondary | ICD-10-CM | POA: Diagnosis not present

## 2016-08-24 DIAGNOSIS — Z7982 Long term (current) use of aspirin: Secondary | ICD-10-CM | POA: Insufficient documentation

## 2016-08-24 DIAGNOSIS — D351 Benign neoplasm of parathyroid gland: Secondary | ICD-10-CM | POA: Insufficient documentation

## 2016-08-24 DIAGNOSIS — C73 Malignant neoplasm of thyroid gland: Principal | ICD-10-CM | POA: Insufficient documentation

## 2016-08-24 DIAGNOSIS — Z87891 Personal history of nicotine dependence: Secondary | ICD-10-CM | POA: Diagnosis not present

## 2016-08-24 DIAGNOSIS — E210 Primary hyperparathyroidism: Secondary | ICD-10-CM | POA: Diagnosis not present

## 2016-08-24 DIAGNOSIS — M1990 Unspecified osteoarthritis, unspecified site: Secondary | ICD-10-CM | POA: Diagnosis not present

## 2016-08-24 DIAGNOSIS — E213 Hyperparathyroidism, unspecified: Secondary | ICD-10-CM | POA: Diagnosis not present

## 2016-08-24 HISTORY — PX: PARATHYROIDECTOMY: SHX19

## 2016-08-24 SURGERY — PARATHYROIDECTOMY
Anesthesia: General | Site: Neck | Laterality: Left

## 2016-08-24 MED ORDER — FENTANYL CITRATE (PF) 250 MCG/5ML IJ SOLN
INTRAMUSCULAR | Status: AC
  Filled 2016-08-24: qty 5

## 2016-08-24 MED ORDER — FENTANYL CITRATE (PF) 100 MCG/2ML IJ SOLN
INTRAMUSCULAR | Status: AC
  Filled 2016-08-24: qty 2

## 2016-08-24 MED ORDER — HYDROMORPHONE HCL 1 MG/ML IJ SOLN
1.0000 mg | INTRAMUSCULAR | Status: DC | PRN
  Administered 2016-08-24: 1 mg via INTRAVENOUS
  Filled 2016-08-24: qty 1

## 2016-08-24 MED ORDER — BUPIVACAINE HCL (PF) 0.5 % IJ SOLN: INTRAMUSCULAR | Status: DC | PRN

## 2016-08-24 MED ORDER — FENTANYL CITRATE (PF) 100 MCG/2ML IJ SOLN
25.0000 ug | INTRAMUSCULAR | Status: DC | PRN
  Administered 2016-08-24: 50 ug via INTRAVENOUS

## 2016-08-24 MED ORDER — PROPOFOL 10 MG/ML IV BOLUS
INTRAVENOUS | Status: AC
  Filled 2016-08-24: qty 20

## 2016-08-24 MED ORDER — ROCURONIUM BROMIDE 10 MG/ML (PF) SYRINGE
PREFILLED_SYRINGE | INTRAVENOUS | Status: DC | PRN
  Administered 2016-08-24: 50 mg via INTRAVENOUS

## 2016-08-24 MED ORDER — MIDAZOLAM HCL 2 MG/2ML IJ SOLN
INTRAMUSCULAR | Status: AC
  Filled 2016-08-24: qty 2

## 2016-08-24 MED ORDER — SUGAMMADEX SODIUM 200 MG/2ML IV SOLN
INTRAVENOUS | Status: DC | PRN
  Administered 2016-08-24: 200 mg via INTRAVENOUS

## 2016-08-24 MED ORDER — 0.9 % SODIUM CHLORIDE (POUR BTL) OPTIME
TOPICAL | Status: DC | PRN
  Administered 2016-08-24: 1000 mL

## 2016-08-24 MED ORDER — FENTANYL CITRATE (PF) 250 MCG/5ML IJ SOLN
INTRAMUSCULAR | Status: DC | PRN
  Administered 2016-08-24: 50 ug via INTRAVENOUS
  Administered 2016-08-24: 100 ug via INTRAVENOUS
  Administered 2016-08-24 (×2): 50 ug via INTRAVENOUS

## 2016-08-24 MED ORDER — BUPIVACAINE HCL (PF) 0.5 % IJ SOLN
INTRAMUSCULAR | Status: AC
  Filled 2016-08-24: qty 30

## 2016-08-24 MED ORDER — SUGAMMADEX SODIUM 200 MG/2ML IV SOLN
INTRAVENOUS | Status: AC
  Filled 2016-08-24: qty 2

## 2016-08-24 MED ORDER — PROPOFOL 10 MG/ML IV BOLUS
INTRAVENOUS | Status: DC | PRN
  Administered 2016-08-24: 120 mg via INTRAVENOUS

## 2016-08-24 MED ORDER — SUCCINYLCHOLINE CHLORIDE 200 MG/10ML IV SOSY
PREFILLED_SYRINGE | INTRAVENOUS | Status: AC
  Filled 2016-08-24: qty 10

## 2016-08-24 MED ORDER — MEPERIDINE HCL 25 MG/ML IJ SOLN: 6.2500 mg | INTRAMUSCULAR | Status: DC | PRN

## 2016-08-24 MED ORDER — ONDANSETRON 4 MG PO TBDP: 4.0000 mg | ORAL_TABLET | Freq: Four times a day (QID) | ORAL | Status: DC | PRN

## 2016-08-24 MED ORDER — PHENYLEPHRINE 40 MCG/ML (10ML) SYRINGE FOR IV PUSH (FOR BLOOD PRESSURE SUPPORT)
PREFILLED_SYRINGE | INTRAVENOUS | Status: AC
  Filled 2016-08-24: qty 10

## 2016-08-24 MED ORDER — HEMOSTATIC AGENTS (NO CHARGE) OPTIME
TOPICAL | Status: DC | PRN
  Administered 2016-08-24: 1 via TOPICAL

## 2016-08-24 MED ORDER — DEXAMETHASONE SODIUM PHOSPHATE 10 MG/ML IJ SOLN
INTRAMUSCULAR | Status: AC
  Filled 2016-08-24: qty 1

## 2016-08-24 MED ORDER — HYDROCODONE-ACETAMINOPHEN 5-325 MG PO TABS
1.0000 | ORAL_TABLET | ORAL | Status: DC | PRN
  Administered 2016-08-25: 1 via ORAL
  Filled 2016-08-24: qty 1

## 2016-08-24 MED ORDER — ONDANSETRON HCL 4 MG/2ML IJ SOLN
INTRAMUSCULAR | Status: DC | PRN
  Administered 2016-08-24: 4 mg via INTRAVENOUS

## 2016-08-24 MED ORDER — ONDANSETRON HCL 4 MG/2ML IJ SOLN: 4.0000 mg | Freq: Four times a day (QID) | INTRAMUSCULAR | Status: DC | PRN

## 2016-08-24 MED ORDER — DEXAMETHASONE SODIUM PHOSPHATE 10 MG/ML IJ SOLN
INTRAMUSCULAR | Status: DC | PRN
  Administered 2016-08-24: 6 mg via INTRAVENOUS

## 2016-08-24 MED ORDER — LIDOCAINE 2% (20 MG/ML) 5 ML SYRINGE
INTRAMUSCULAR | Status: AC
Start: 2016-08-24 — End: ?
  Filled 2016-08-24: qty 5

## 2016-08-24 MED ORDER — PHENOL 1.4 % MT LIQD
1.0000 | OROMUCOSAL | Status: DC | PRN
  Filled 2016-08-24: qty 177

## 2016-08-24 MED ORDER — LIDOCAINE 2% (20 MG/ML) 5 ML SYRINGE
INTRAMUSCULAR | Status: AC
  Filled 2016-08-24: qty 5

## 2016-08-24 MED ORDER — ONDANSETRON HCL 4 MG/2ML IJ SOLN
INTRAMUSCULAR | Status: AC
  Filled 2016-08-24: qty 4

## 2016-08-24 MED ORDER — PROMETHAZINE HCL 25 MG/ML IJ SOLN: 6.2500 mg | INTRAMUSCULAR | Status: DC | PRN

## 2016-08-24 MED ORDER — TRAMADOL HCL 50 MG PO TABS: 50.0000 mg | ORAL_TABLET | Freq: Four times a day (QID) | ORAL | Status: DC | PRN

## 2016-08-24 MED ORDER — ROCURONIUM BROMIDE 10 MG/ML (PF) SYRINGE
PREFILLED_SYRINGE | INTRAVENOUS | Status: AC
  Filled 2016-08-24: qty 5

## 2016-08-24 MED ORDER — MIDAZOLAM HCL 5 MG/5ML IJ SOLN
INTRAMUSCULAR | Status: DC | PRN
  Administered 2016-08-24: 2 mg via INTRAVENOUS

## 2016-08-24 MED ORDER — LACTATED RINGERS IV SOLN
INTRAVENOUS | Status: DC
  Administered 2016-08-24: 13:00:00 via INTRAVENOUS

## 2016-08-24 MED ORDER — MENTHOL 3 MG MT LOZG
1.0000 | LOZENGE | OROMUCOSAL | Status: DC | PRN
  Filled 2016-08-24: qty 9

## 2016-08-24 MED ORDER — ACETAMINOPHEN 650 MG RE SUPP: 650.0000 mg | Freq: Four times a day (QID) | RECTAL | Status: DC | PRN

## 2016-08-24 MED ORDER — ONDANSETRON HCL 4 MG/2ML IJ SOLN
INTRAMUSCULAR | Status: AC
  Filled 2016-08-24: qty 2

## 2016-08-24 MED ORDER — EPHEDRINE 5 MG/ML INJ
INTRAVENOUS | Status: AC
  Filled 2016-08-24: qty 10

## 2016-08-24 MED ORDER — MIDAZOLAM HCL 2 MG/2ML IJ SOLN: 0.5000 mg | Freq: Once | INTRAMUSCULAR | Status: DC | PRN

## 2016-08-24 MED ORDER — LIDOCAINE 2% (20 MG/ML) 5 ML SYRINGE
INTRAMUSCULAR | Status: DC | PRN
  Administered 2016-08-24: 20 mg via INTRAVENOUS

## 2016-08-24 MED ORDER — ACETAMINOPHEN 325 MG PO TABS: 650.0000 mg | ORAL_TABLET | Freq: Four times a day (QID) | ORAL | Status: DC | PRN

## 2016-08-24 MED ORDER — KCL IN DEXTROSE-NACL 20-5-0.45 MEQ/L-%-% IV SOLN
INTRAVENOUS | Status: DC
  Administered 2016-08-24: 19:00:00 via INTRAVENOUS
  Filled 2016-08-24: qty 1000

## 2016-08-24 SURGICAL SUPPLY — 55 items
ATTRACTOMAT 16X20 MAGNETIC DRP (DRAPES) ×2 IMPLANT
BLADE SURG 15 STRL LF DISP TIS (BLADE) ×1 IMPLANT
BLADE SURG 15 STRL SS (BLADE) ×4
CANISTER SUCT 3000ML PPV (MISCELLANEOUS) ×2 IMPLANT
CHLORAPREP W/TINT 26ML (MISCELLANEOUS) ×2 IMPLANT
CLIP VESOCCLUDE MED 6/CT (CLIP) ×4 IMPLANT
CLIP VESOCCLUDE SM WIDE 6/CT (CLIP) ×4 IMPLANT
CONT SPEC 4OZ CLIKSEAL STRL BL (MISCELLANEOUS) ×3 IMPLANT
COVER SURGICAL LIGHT HANDLE (MISCELLANEOUS) ×2 IMPLANT
CRADLE DONUT ADULT HEAD (MISCELLANEOUS) ×2 IMPLANT
DRAPE LAPAROTOMY 100X72 PEDS (DRAPES) ×2 IMPLANT
DRAPE UTILITY XL STRL (DRAPES) ×2 IMPLANT
ELECT CAUTERY BLADE 6.4 (BLADE) ×2 IMPLANT
ELECT REM PT RETURN 9FT ADLT (ELECTROSURGICAL) ×2
ELECTRODE REM PT RTRN 9FT ADLT (ELECTROSURGICAL) ×1 IMPLANT
GAUZE SPONGE 2X2 8PLY STRL LF (GAUZE/BANDAGES/DRESSINGS) ×1 IMPLANT
GAUZE SPONGE 4X4 12PLY STRL (GAUZE/BANDAGES/DRESSINGS) ×1 IMPLANT
GAUZE SPONGE 4X4 16PLY XRAY LF (GAUZE/BANDAGES/DRESSINGS) ×3 IMPLANT
GLOVE BIO SURGEON STRL SZ7.5 (GLOVE) ×1 IMPLANT
GLOVE BIOGEL PI IND STRL 6.5 (GLOVE) IMPLANT
GLOVE BIOGEL PI IND STRL 8 (GLOVE) IMPLANT
GLOVE BIOGEL PI INDICATOR 6.5 (GLOVE) ×1
GLOVE BIOGEL PI INDICATOR 8 (GLOVE) ×1
GLOVE SURG ORTHO 8.0 STRL STRW (GLOVE) ×2 IMPLANT
GLOVE SURG SS PI 6.0 STRL IVOR (GLOVE) ×1 IMPLANT
GOWN STRL REUS W/ TWL LRG LVL3 (GOWN DISPOSABLE) ×1 IMPLANT
GOWN STRL REUS W/ TWL XL LVL3 (GOWN DISPOSABLE) ×1 IMPLANT
GOWN STRL REUS W/TWL LRG LVL3 (GOWN DISPOSABLE)
GOWN STRL REUS W/TWL XL LVL3 (GOWN DISPOSABLE) ×4
HEMOSTAT SURGICEL 2X4 FIBR (HEMOSTASIS) ×2 IMPLANT
ILLUMINATOR WAVEGUIDE N/F (MISCELLANEOUS) ×1 IMPLANT
KIT BASIN OR (CUSTOM PROCEDURE TRAY) ×2 IMPLANT
KIT ROOM TURNOVER OR (KITS) ×2 IMPLANT
NDL HYPO 25GX1X1/2 BEV (NEEDLE) ×1 IMPLANT
NEEDLE HYPO 25GX1X1/2 BEV (NEEDLE) ×2 IMPLANT
NS IRRIG 1000ML POUR BTL (IV SOLUTION) ×2 IMPLANT
PACK SURGICAL SETUP 50X90 (CUSTOM PROCEDURE TRAY) ×2 IMPLANT
PAD ARMBOARD 7.5X6 YLW CONV (MISCELLANEOUS) ×2 IMPLANT
PENCIL BUTTON HOLSTER BLD 10FT (ELECTRODE) ×2 IMPLANT
SHEARS HARMONIC 9CM CVD (BLADE) ×1 IMPLANT
SPONGE GAUZE 2X2 STER 10/PKG (GAUZE/BANDAGES/DRESSINGS)
SPONGE INTESTINAL PEANUT (DISPOSABLE) IMPLANT
STRIP CLOSURE SKIN 1/2X4 (GAUZE/BANDAGES/DRESSINGS) ×2 IMPLANT
SUT MNCRL AB 4-0 PS2 18 (SUTURE) ×2 IMPLANT
SUT SILK 2 0 (SUTURE)
SUT SILK 2-0 18XBRD TIE 12 (SUTURE) IMPLANT
SUT SILK 3 0 (SUTURE)
SUT SILK 3-0 18XBRD TIE 12 (SUTURE) IMPLANT
SUT VIC AB 3-0 SH 18 (SUTURE) ×3 IMPLANT
SYR BULB 3OZ (MISCELLANEOUS) ×2 IMPLANT
SYR CONTROL 10ML LL (SYRINGE) ×2 IMPLANT
TAPE CLOTH SURG 4X10 WHT LF (GAUZE/BANDAGES/DRESSINGS) ×1 IMPLANT
TOWEL OR 17X24 6PK STRL BLUE (TOWEL DISPOSABLE) ×1 IMPLANT
TOWEL OR 17X26 10 PK STRL BLUE (TOWEL DISPOSABLE) ×2 IMPLANT
TUBE CONNECTING 12X1/4 (SUCTIONS) ×2 IMPLANT

## 2016-08-24 NOTE — Brief Op Note (Signed)
08/24/2016  4:07 PM  PATIENT:  [name] [name]  66 y.o. female  PRE-OPERATIVE DIAGNOSIS:  primary hyperparathyroidism  POST-OPERATIVE DIAGNOSIS:  primary hyperparathyroidism, supicious neoplasm thyroid left lobe  PROCEDURE:  Procedure(s): NECK EXPLORATION LEFT SUPERIOR PARATHYROIDECTOMY LEFT LOBE THYROIDECTOMY (Left)  SURGEON:  Surgeon(s) and Role:    Ralene Ok, MD - Assisting    * Armandina Gemma, MD - Primary  ANESTHESIA:   general  EBL:  Total I/O In: 800 [I.V.:800] Out: 50 [Blood:50]  BLOOD ADMINISTERED:none  DRAINS: none   LOCAL MEDICATIONS USED:  NONE  SPECIMEN:  Excision  DISPOSITION OF SPECIMEN:  PATHOLOGY  COUNTS:  YES  TOURNIQUET:  * No tourniquets in log *  DICTATION: .Other Dictation: Dictation Number [id number]  PLAN OF CARE: Admit for overnight observation  PATIENT DISPOSITION:  PACU - hemodynamically stable.   Delay start of Pharmacological VTE agent (>24hrs) due to surgical blood loss or risk of bleeding: yes  Earnstine Regal, MD, [facility] Office: [phone]

## 2016-08-24 NOTE — Anesthesia Postprocedure Evaluation (Signed)
Anesthesia Post Note  Patient: [name] [name]  Procedure(s) Performed: Procedure(s) (LRB): NECK EXPLORATION LEFT SUPERIOR PARATHYROIDECTOMY LEFT LOBE THYROIDECTOMY (Left)     Patient location during evaluation: PACU Anesthesia Type: General Level of consciousness: awake and alert, patient cooperative and oriented Pain management: pain level controlled (pain improving) Vital Signs Assessment: post-procedure vital signs reviewed and stable Respiratory status: spontaneous breathing, nonlabored ventilation, respiratory function stable and patient connected to nasal cannula oxygen Cardiovascular status: blood pressure returned to baseline and stable Postop Assessment: no signs of nausea or vomiting Anesthetic complications: no                  Cortez Steelman,E. Amir Fick

## 2016-08-24 NOTE — Anesthesia Procedure Notes (Signed)
Procedure Name: Intubation Date/Time: 08/24/2016 2:29 PM Performed by: Myna Bright Pre-anesthesia Checklist: Patient identified, Emergency Drugs available, Suction available and Patient being monitored Patient Re-evaluated:Patient Re-evaluated prior to induction Oxygen Delivery Method: Circle system utilized Preoxygenation: Pre-oxygenation with 100% oxygen Induction Type: IV induction Ventilation: Mask ventilation without difficulty Laryngoscope Size: Mac and 3 Grade View: Grade I Tube type: Oral Tube size: 7.5 mm Number of attempts: 1 Airway Equipment and Method: Stylet Placement Confirmation: ETT inserted through vocal cords under direct vision,  breath sounds checked- equal and bilateral and positive ETCO2 Secured at: 21 cm Tube secured with: Tape Dental Injury: Teeth and Oropharynx as per pre-operative assessment

## 2016-08-24 NOTE — Transfer of Care (Signed)
Immediate Anesthesia Transfer of Care Note  Patient: Erdine Hulen [facility]  Procedure(s) Performed: Procedure(s): NECK EXPLORATION LEFT SUPERIOR PARATHYROIDECTOMY LEFT LOBE THYROIDECTOMY (Left)  Patient Location: PACU  Anesthesia Type:General  Level of Consciousness: awake, alert , oriented and patient cooperative  Airway & Oxygen Therapy: Patient Spontanous Breathing and Patient connected to nasal cannula oxygen  Post-op Assessment: Report given to RN, Post -op Vital signs reviewed and stable and Patient moving all extremities  Post vital signs: Reviewed and stable  Last Vitals:  Vitals:   08/24/16 1312  BP: (!) 148/68  Pulse: 63  Resp: 20  Temp: 36.8 C  SpO2: 100%    Last Pain:  Vitals:   08/24/16 1312  TempSrc: Oral      Patients Stated Pain Goal: 5 (53/97/67 3419)  Complications: No apparent anesthesia complications

## 2016-08-24 NOTE — Anesthesia Preprocedure Evaluation (Signed)
Anesthesia Evaluation  Patient identified by MRN, date of birth, ID band Patient awake    Reviewed: Allergy & Precautions, NPO status , Patient's Chart, lab work & pertinent test results  History of Anesthesia Complications Negative for: history of anesthetic complications  Airway Mallampati: II  TM Distance: >3 FB Neck ROM: Full    Dental  (+) Dental Advisory Given   Pulmonary former smoker,    breath sounds clear to auscultation       Cardiovascular  Rhythm:Regular Rate:Normal  '16 Exercise stress: normal   Neuro/Psych Anxiety Depression    GI/Hepatic Neg liver ROS, GERD  Medicated and Controlled,  Endo/Other  hyperparathyroidism  Renal/GU negative Renal ROS     Musculoskeletal  (+) Arthritis ,   Abdominal   Peds  Hematology negative hematology ROS (+)   Anesthesia Other Findings   Reproductive/Obstetrics                             Anesthesia Physical Anesthesia Plan  ASA: II  Anesthesia Plan: General   Post-op Pain Management:    Induction: Intravenous  PONV Risk Score and Plan: 3 and Ondansetron, Dexamethasone and Midazolam  Airway Management Planned: Oral ETT  Additional Equipment:   Intra-op Plan:   Post-operative Plan: Extubation in OR  Informed Consent: I have reviewed the patients History and Physical, chart, labs and discussed the procedure including the risks, benefits and alternatives for the proposed anesthesia with the patient or authorized representative who has indicated his/her understanding and acceptance.   Dental advisory given  Plan Discussed with: CRNA and Surgeon  Anesthesia Plan Comments: (Plan routine monitors, GETA)        Anesthesia Quick Evaluation

## 2016-08-24 NOTE — Interval H&P Note (Signed)
History and Physical Interval Note:  08/24/2016 1:18 PM  [name] [name]  has presented today for surgery, with the diagnosis of primary hyperparathyroidism  The various methods of treatment have been discussed with the patient and family. After consideration of risks, benefits and other options for treatment, the patient has consented to    Procedure(s): LEFT INFERIOR PARATHYROIDECTOMY (Left) as a surgical intervention .    The patient's history has been reviewed, patient examined, no change in status, stable for surgery.  I have reviewed the patient's chart and labs.  Questions were answered to the patient's satisfaction.    Earnstine Regal, MD, [facility] Office: [place]

## 2016-08-25 ENCOUNTER — Encounter (HOSPITAL_COMMUNITY): Payer: Self-pay | Admitting: Surgery

## 2016-08-25 DIAGNOSIS — K219 Gastro-esophageal reflux disease without esophagitis: Secondary | ICD-10-CM | POA: Diagnosis not present

## 2016-08-25 DIAGNOSIS — Z79899 Other long term (current) drug therapy: Secondary | ICD-10-CM | POA: Diagnosis not present

## 2016-08-25 DIAGNOSIS — Z7982 Long term (current) use of aspirin: Secondary | ICD-10-CM | POA: Diagnosis not present

## 2016-08-25 DIAGNOSIS — C73 Malignant neoplasm of thyroid gland: Secondary | ICD-10-CM | POA: Diagnosis not present

## 2016-08-25 DIAGNOSIS — D351 Benign neoplasm of parathyroid gland: Secondary | ICD-10-CM | POA: Diagnosis not present

## 2016-08-25 DIAGNOSIS — E210 Primary hyperparathyroidism: Secondary | ICD-10-CM | POA: Diagnosis not present

## 2016-08-25 DIAGNOSIS — Z87891 Personal history of nicotine dependence: Secondary | ICD-10-CM | POA: Diagnosis not present

## 2016-08-25 DIAGNOSIS — Z86010 Personal history of colonic polyps: Secondary | ICD-10-CM | POA: Diagnosis not present

## 2016-08-25 LAB — BASIC METABOLIC PANEL
Anion gap: 5 (ref 5–15)
BUN: 9 mg/dL (ref 6–20)
CALCIUM: 8.9 mg/dL (ref 8.9–10.3)
CO2: 27 mmol/L (ref 22–32)
CREATININE: 0.68 mg/dL (ref 0.44–1.00)
Chloride: 106 mmol/L (ref 101–111)
GFR calc Af Amer: >60 mL/min (ref >60)
GLUCOSE: 162 mg/dL — AB (ref 65–99)
POTASSIUM: 4.5 mmol/L (ref 3.5–5.1)
SODIUM: 138 mmol/L (ref 135–145)

## 2016-08-25 MED ORDER — PNEUMOCOCCAL VAC POLYVALENT 25 MCG/0.5ML IJ INJ: 0.5000 mL | INJECTION | INTRAMUSCULAR | Status: DC

## 2016-08-25 MED ORDER — HYDROCODONE-ACETAMINOPHEN 5-325 MG PO TABS: 1.0000 | ORAL_TABLET | ORAL | 0 refills | Status: DC | PRN

## 2016-08-25 NOTE — Op Note (Addendum)
NAME:  [name] [name], [name] [name] NO.:  MEDICAL RECORD NO.:  [id number]  LOCATION:                                 FACILITY:  PHYSICIAN:  Earnstine Regal, MD           DATE OF BIRTH:  DATE OF PROCEDURE:  08/24/2016                              OPERATIVE REPORT   PREOPERATIVE DIAGNOSIS:  Primary hyperparathyroidism.  POSTOPERATIVE DIAGNOSES: 1. Primary hyperparathyroidism. 2. Neoplasm of uncertain behavior, left thyroid lobe.  PROCEDURES: 1. Neck exploration. 2. Left superior parathyroidectomy. 3. Left thyroid lobectomy.  SURGEON:  Earnstine Regal, MD  ASSISTANT:  Ralene Ok, MD  ANESTHESIA:  General per Dr. Annye Asa.  ESTIMATED BLOOD LOSS:  Minimal.  PREPARATION:  ChloraPrep.  COMPLICATIONS:  None.  INDICATIONS:  The patient is a 66 year old female referred for signs and symptoms of primary hyperparathyroidism.  Nuclear Medicine parathyroid scan had localized a parathyroid adenoma to the left inferior position. The patient now comes to Surgery for parathyroidectomy.  FINDINGS:  Patient underwent neck exploration for primary hyperparathyroidism and parathyroidectomy.  She was found intra-operatively to have a suspicious mass in the thyroid which require thyroid lobectomy as a separate and unrelated procedure.  This finding was independent of the parathyroid disease and ultimately proved to be thyroid carcinoma by pathology.  BODY OF REPORT:  Procedure was done in OR #8 at the [place].  The patient was brought to the operating room, placed in a supine position on the operating room table.  Following administration of general anesthesia, the patient was positioned and then prepped and draped in usual aseptic fashion.  After ascertaining that an adequate level of anesthesia had been achieved, a left anterior neck incision was made with a #15 blade.  Dissection was carried through subcutaneous tissues and platysma.  Skin flaps  were elevated circumferentially and a Weitlaner retractor placed for exposure.  Strap muscles were incised in the midline and reflected to the left.  The area along the inferior pole of the left thyroid lobe is explored.  Airway is identified.  Thyrothymic tract is opened and contains only adipose tissue.  Dissection is carried posteriorly along the esophagus. Recurrent laryngeal nerve is identified and preserved.  On the inferior pole of the left lobe of the thyroid is a normal parathyroid gland measuring approximately 5 mm in greatest dimension.  There is a firm, hard, probably calcified mass involving the superior pole of the left thyroid lobe.  A decision is made to explore this area further looking for parathyroid adenoma and evaluating the left lobe of the thyroid. Therefore, the incision is extended across the midline and skin flaps are elevated cephalad and caudad and a Mahorner self-retaining retractor placed for exposure.  Left thyroid lobe is then fully mobilized.  The inferior thyroid vein is divided between medium Ligaclips with the Harmonic scalpel.  Gland is gently mobilized.  There is firm mass in the superior pole of the gland, which is suspicious for malignancy.  There is a desmoplastic reaction around the upper pole of the gland.  The upper pole is mobilized. Superior pole vessels are divided between Ligaclips with the Harmonic  scalpel.  Gland is rolled anteriorly.  Posterior to the superior pole vessels is an enlarged parathyroid gland consistent with parathyroid adenoma.  This is gently dissected out, mobilized, and its vascular pedicle divided with the Harmonic scalpel.  This gland is submitted to Pathology and frozen section biopsy confirmed hypercellular parathyroid tissue.  Decision is made to proceed with left thyroid lobectomy.  Due to the suspicious neoplasm in the superior pole, there is no palpable abnormality on the right side and the right side of the  neck is not explored.  Left lobe is mobilized anteriorly and branches of the inferior thyroid artery are divided between small Ligaclips.  Inferior parathyroid is preserved on its vascular pedicle.  Care was taken to avoid the recurrent laryngeal nerve.  Ligament of Gwenlyn Found was released with the electrocautery and the gland was mobilized onto the anterior trachea.  Isthmus was mobilized across the midline and then transected at its junction with the right thyroid lobe with the Harmonic scalpel. Left thyroid lobe was submitted to Pathology for permanent review.  Left neck was irrigated with warm saline.  Good hemostasis was achieved. Fibrillar was placed throughout the operative field.  Strap muscles were reapproximated in the midline with interrupted 3-0 Vicryl sutures. Platysma was closed with interrupted 3-0 Vicryl sutures.  Skin was closed with a running 4-0 Monocryl subcuticular suture.  Wound was washed and dried, and Steri-Strips were applied.  Sterile gauze dressings were applied.  The patient was awakened from anesthesia and brought to the recovery room.  The patient tolerated the procedure well.   Earnstine Regal, MD, [facility] Office: [phone]    TMG/MEDQ  D:  08/24/2016  T:  08/24/2016  Job:  [id number]  cc:   Gar Ponto, MD Ralene Ok, M.D.

## 2016-08-25 NOTE — Progress Notes (Signed)
Pt discharged home in stable condition after going over discharge instructions. Pt expressed understanding the teaching with no concerns voiced. AVS and discharge script given

## 2016-08-25 NOTE — Discharge Summary (Signed)
Physician Discharge Summary [facility]  Patient ID: [name] [name] MRN: [id number] DOB/AGE: 1950/07/22 66 y.o.  Admit date: 08/24/2016 Discharge date: 08/25/2016  Admission Diagnoses:  Primary hyperparathyroidism, thyroid neoplasm of uncertain behavior  Discharge Diagnoses:  Principal Problem:   Primary hyperparathyroidism [facility])   Discharged Condition: good  Hospital Course: Patient was admitted for observation following parathyroid and thyroid surgery.  Post op course was uncomplicated.  Pain was well controlled.  Tolerated diet.  Post op calcium level on morning following surgery was 8.9 mg/dl.  Patient was prepared for discharge home on POD#1.  Consults: None  Treatments: surgery: left superior parathyroidectomy, left thyroid lobectomy  Discharge Exam: Blood pressure (!) 116/46, pulse 70, temperature 98.2 F (36.8 C), temperature source Oral, resp. rate 17, SpO2 95 %. HEENT - clear Neck - wound dry and intact; mild STS; voice normal Chest - clear bilaterally Cor - RRR  Disposition: Home  Discharge Instructions    Apply dressing    Complete by:  As directed    Apply light gauze dressing to wound before discharge home today.   Diet - low sodium heart healthy    Complete by:  As directed    Discharge instructions    Complete by:  As directed    [facility] [facility]  THYROID & PARATHYROID SURGERY:  POST-OP INSTRUCTIONS  Always review your discharge instruction sheet from the facility where your surgery was performed.  A prescription for pain medication may be given to you upon discharge.  Take your pain medication as prescribed.  If narcotic pain medicine is not needed, then you may take acetaminophen (Tylenol) or ibuprofen (Advil) as needed.  Take your usually prescribed medications unless otherwise directed.  If you need a refill on your pain medication, please contact our office during regular business hours.  Prescriptions will  not be processed by our office after 5 pm or on weekends.  Start with a light diet upon arrival home, such as soup and crackers or toast.  Be sure to drink plenty of fluids daily.  Resume your normal diet the day after surgery.  Most patients will experience some swelling and bruising on the chest and neck area.  Ice packs will help.  Swelling and bruising can take several days to resolve.   It is common to experience some constipation after surgery.  Increasing fluid intake and taking a stool softener (Colace) will usually help or prevent this problem.  A mild laxative (Milk of Magnesia or Miralax) should be taken according to package directions if there has been no bowel movement after 48 hours.  You have steri-strips and a gauze dressing over your incision.  You may remove the gauze bandage on the second day after surgery, and you may shower at that time.  Leave your steri-strips (small skin tapes) in place directly over the incision.  These strips should remain on the skin for 5-7 days and then be removed.  You may get them wet in the shower and pat them dry.  You may resume regular (light) daily activities beginning the next day - such as daily self-care, walking, climbing stairs - gradually increasing activities as tolerated.  You may have sexual intercourse when it is comfortable.  Refrain from any heavy lifting or straining until approved by your doctor.  You may drive when you no longer are taking prescription pain medication, you can comfortably wear a seatbelt, and you can safely maneuver your car and apply brakes.  You  should see your doctor in the office for a follow-up appointment approximately three weeks after your surgery.  Make sure that you call for this appointment within a day or two after you arrive home to insure a convenient appointment time.  WHEN TO CALL YOUR DOCTOR: -- Fever greater than 101.5 -- Inability to urinate -- Nausea and/or vomiting - persistent -- Extreme  swelling or bruising -- Continued bleeding from incision -- Increased pain, redness, or drainage from the incision -- Difficulty swallowing or breathing -- Muscle cramping or spasms -- Numbness or tingling in hands or around lips  The clinic staff is available to answer your questions during regular business hours.  Please don't hesitate to call and ask to speak to one of the nurses if you have concerns.  Earnstine Regal, MD, [facility] Office: [phone]  Website: [email or web address]   Ice pack    Complete by:  As directed    Increase activity slowly    Complete by:  As directed    Remove dressing in 24 hours    Complete by:  As directed      Allergies as of 08/25/2016      Reactions   Guaifenesin & Derivatives Nausea And Vomiting   Tremors, anxiety   Sulfamethoxazole Other (See Comments)   Pt states it doesn't work and she doesn't want to take it       Medication List    TAKE these medications   aspirin EC 81 MG tablet Take 1 tablet (81 mg total) by mouth daily.   cetirizine 10 MG tablet Commonly known as:  ZYRTEC Take 10 mg by mouth daily.   HYDROcodone-acetaminophen 5-325 MG tablet Commonly known as:  NORCO/VICODIN Take 1-2 tablets by mouth every 4 (four) hours as needed for moderate pain.   minocycline 100 MG capsule Commonly known as:  MINOCIN,DYNACIN Take 100 mg by mouth See admin instructions. Takes 100mg  daily for 2 weeks when she has "an outbreak".   omeprazole 20 MG capsule Commonly known as:  PRILOSEC Take 20 mg by mouth at bedtime.   THERATEARS OP Place 1 drop into both eyes 2 (two) times daily as needed (dry eyes).        Earnstine Regal, MD, [facility] Office: [phone]   Signed: Earnstine Regal 08/25/2016, 7:39 AM

## 2016-09-13 DIAGNOSIS — E210 Primary hyperparathyroidism: Secondary | ICD-10-CM | POA: Diagnosis not present

## 2016-09-15 DIAGNOSIS — E210 Primary hyperparathyroidism: Secondary | ICD-10-CM | POA: Diagnosis not present

## 2016-09-17 ENCOUNTER — Telehealth: Payer: Self-pay | Admitting: General Practice

## 2016-09-17 NOTE — Telephone Encounter (Signed)
Referring MD office is aware we have received the records but are awaiting labs

## 2016-09-17 NOTE — Telephone Encounter (Signed)
Dr. Harlow Asa referred on 08/28th.  Parathyroidectomy  Please update on whether or not the referral was received.  Ty,  -LL

## 2016-09-24 DIAGNOSIS — C73 Malignant neoplasm of thyroid gland: Secondary | ICD-10-CM | POA: Diagnosis not present

## 2016-09-24 DIAGNOSIS — E210 Primary hyperparathyroidism: Secondary | ICD-10-CM | POA: Diagnosis not present

## 2016-10-25 DIAGNOSIS — Z23 Encounter for immunization: Secondary | ICD-10-CM | POA: Diagnosis not present

## 2016-10-27 ENCOUNTER — Ambulatory Visit (INDEPENDENT_AMBULATORY_CARE_PROVIDER_SITE_OTHER): Payer: Medicare Other | Admitting: Internal Medicine

## 2016-10-27 ENCOUNTER — Encounter: Payer: Self-pay | Admitting: Internal Medicine

## 2016-10-27 VITALS — BP 100/70 | HR 76 | Wt 169.0 lb

## 2016-10-27 DIAGNOSIS — E559 Vitamin D deficiency, unspecified: Secondary | ICD-10-CM

## 2016-10-27 DIAGNOSIS — C73 Malignant neoplasm of thyroid gland: Secondary | ICD-10-CM

## 2016-10-27 DIAGNOSIS — E210 Primary hyperparathyroidism: Secondary | ICD-10-CM | POA: Diagnosis not present

## 2016-10-27 LAB — T3, FREE: T3 FREE: 3.3 pg/mL (ref 2.3–4.2)

## 2016-10-27 LAB — T4, FREE: Free T4: 0.61 ng/dL (ref 0.60–1.60)

## 2016-10-27 LAB — TSH: TSH: 3.5 u[IU]/mL (ref 0.35–4.50)

## 2016-10-27 LAB — VITAMIN D 25 HYDROXY (VIT D DEFICIENCY, FRACTURES): VITD: 20.09 ng/mL — AB (ref 30.00–100.00)

## 2016-10-27 NOTE — Progress Notes (Addendum)
Patient ID: [name] [name], female   DOB: March 18, 1950, 66 y.o.   MRN: [id number]    HPI  [name] [name] is a 66 y.o.-year-old female, referred by Dr. Gerrit Friends, for management of thyroid cancer. She also has a history of primary hyperparathyroidism, and is status post parathyroidectomy.  Pt describes that 2-3 years ago >> started to feel pressure in L side of neck + hoarseness + coughing.  She was found to have a high calcium earlier this year (07/2016).  Pt. has been dx with thyroid cancer in 08/2016, after having had neck exploration, left superior parathyroidectomy, and, as the mass was observed in the left thyroid lobe during surgery, she also had left lobectomy. 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 This was 2 cm, with positive surgical margins.  Diagnosis 1. Parathyroid gland, Left Superior - ENLARGED PARATHYROID TISSUE, 0.445 GRAMS. 2. Thyroid, lobectomy, Left - PAPILLARY THYROID CARCINOMA, HURTHLE CELL VARIANT, 2.0 CM. - CARCINOMA IS PRESENT AT NON ORIENTED TISSUE EDGE(S). - SEE ONCOLOGY TABLE BELOW Microscopic Comment 2. THYROID Specimen: Left thyroid Procedure (including lymph node sampling if applicable): Hemithyroidectomy Specimen Integrity (intact/fragmented): Focally disrupted Tumor focality: Unifocal Dominant tumor: Maximum tumor size (cm): 2.0 cm (gross measurement) Tumor laterality: Left thyroid Histologic type (including subtype and/or unique features as applicable): Papillary thyroid carcinoma, Hurthle cell variant Tumor capsule: Not identified Extrathyroidal extension: Not identified Capsular invasion with degree of invasion if present: N/A Margins: Present at non-oriented tissue edge(s). Lymphatic or vascular invasion: Not identified Lymph nodes: # examined 0; # positive; N/A Extracapsular extension (if applicable): Not identified TNM code: pT1b, pNX Non-neoplastic thyroid: Hashimoto's  thyroiditis  She was referred to endocrinology to see if she requires completion thyroidectomy.  I reviewed pt's thyroid tests: Lab Results  Component Value Date   TSH 0.945 07/23/2014   Pt describes: - fatigue - weight gain - heat intolerance - constipation  No: - dry skin - hair loss - depression  Pt denies: - feeling nodules in neck - dysphagia - choking - SOB with lying down  But she has had hoarseness before the surgery. She also felt a strangling >> resolved.  She has + FH of thyroid disorders in: sister. No FH of thyroid cancer. On Biotin in Hair Skin Nails - off for 3 days now. No h/o radiation tx to head or neck.  She has a h/o vit D def >> not on supplements now but was on Ergocalciferol weekly before.   ROS: Constitutional: + see HPI, + nocturia Eyes: no blurry vision, no xerophthalmia ENT: no sore throat, + see HPI, + tinnitus Cardiovascular: no CP/SOB/+ palpitations/+ leg swelling Respiratory: no cough/SOB Gastrointestinal: no N/V/D/+C/+ heartburn Musculoskeletal: + muscle/no joint aches Skin: no rashes Neurological: no tremors/numbness/tingling/dizziness Psychiatric: no depression/anxiety  Past Medical History:  Diagnosis Date  . Anxiety    because of parathyroid issues  . Arthritis    hands  . Asthma    x 2, triggered by allergies  . Cancer (HCC)    melanoma removed from back  . Depression    bc of parathyroid problems  . GERD (gastroesophageal reflux disease)   . History of palpitations    none since 2016  . Seasonal allergies   . Varicose vein    Past Surgical History:  Procedure Laterality Date  . CARPAL TUNNEL RELEASE Right 02/05/2014   Procedure: RIGHT CARPAL TUNNEL RELEASE;  Surgeon: Kerrin Champagne, MD;  Location: [facility];  Service:  Orthopedics;  Laterality: Right;  . COLONOSCOPY    . EYE SURGERY     Bilateral Lasik  . LAPAROSCOPIC ASSISTED VAGINAL HYSTERECTOMY Bilateral 10/11/2013   Procedure: LAPAROSCOPIC  ASSISTED VAGINAL HYSTERECTOMY BILATERAL SALPINGO OOPHORECTOMY;  Surgeon: Allena Katz, MD;  Location: [facility];  Service: Gynecology;  Laterality: Bilateral;  . Malignant melanoma resected     lower back area  . PARATHYROIDECTOMY Left 08/24/2016   Procedure: NECK EXPLORATION LEFT SUPERIOR PARATHYROIDECTOMY LEFT LOBE THYROIDECTOMY;  Surgeon: Armandina Gemma, MD;  Location: [facility];  Service: General;  Laterality: Left;  . trigger thumb surgery     right  . TUBAL LIGATION    . VARICOSE VEIN SURGERY     leg   Social History   Social History  . Marital status: Married    Spouse name: N/A  . Number of children: 2  . Years of education: N/A   Occupational History  . Retired    Social History Main Topics  . Smoking status: Former Smoker    Packs/day: 1.00    Years: 10.00    Types: Cigarettes    Quit date: 01/05/1991  . Smokeless tobacco: Never Used  . Alcohol use No  . Drug use: No  . Sexual activity: Yes    Birth control/ protection: Post-menopausal, Surgical   Other Topics Concern  . Not on file   Social History Narrative  . No narrative on file   Current Outpatient Prescriptions on File Prior to Visit  Medication Sig Dispense Refill  . aspirin EC 81 MG tablet Take 1 tablet (81 mg total) by mouth daily. 30 tablet 3  . Carboxymethylcellulose Sodium (THERATEARS OP) Place 1 drop into both eyes 2 (two) times daily as needed (dry eyes).    . cetirizine (ZYRTEC) 10 MG tablet Take 10 mg by mouth daily.    Marland Kitchen HYDROcodone-acetaminophen (NORCO/VICODIN) 5-325 MG tablet Take 1-2 tablets by mouth every 4 (four) hours as needed for moderate pain. 15 tablet 0  . minocycline (MINOCIN,DYNACIN) 100 MG capsule Take 100 mg by mouth See admin instructions. Takes '100mg'$  daily for 2 weeks when she has "an outbreak".    Marland Kitchen omeprazole (PRILOSEC) 20 MG capsule Take 20 mg by mouth at bedtime.     No current facility-administered medications on file prior to visit.    Allergies  Allergen Reactions  .  Guaifenesin & Derivatives Nausea And Vomiting    Tremors, anxiety  . Sulfamethoxazole Other (See Comments)    Pt states it doesn't work and she doesn't want to take it    Family History  Problem Relation Age of Onset  . Diabetes Mother 20       deceased  . Heart failure Mother   . CAD Mother   . Heart attack Father 23       deceased  . Heart attack Sister 25       deceased  . Heart failure Brother 31       deceased    PE: BP 100/70 (BP Location: Left Arm, Patient Position: Sitting, Cuff Size: Normal)   Pulse 76   Wt 169 lb (76.7 kg)   SpO2 97%   BMI 29.01 kg/m  Wt Readings from Last 3 Encounters:  10/27/16 169 lb (76.7 kg)  08/19/16 163 lb 4.8 oz (74.1 kg)  08/27/14 168 lb 7 oz (76.4 kg)   Constitutional: overweight, in NAD Eyes: PERRLA, EOMI, no exophthalmos ENT: moist mucous membranes, no thyroid masses palpable, thyroidectomy scar healing - no  keloid, no dyesthesia, no erythema, no cervical lymphadenopathy Cardiovascular: RRR, No MRG Respiratory: CTA B Gastrointestinal: abdomen soft, NT, ND, BS+ Musculoskeletal: no deformities, strength intact in all 4 Skin: moist, warm, no rashes Neurological: no tremor with outstretched hands, DTR normal in all 4  ASSESSMENT: 1. Thyroid cancer - see HPI  2. H/o HPTH  3. Vitamin D def  PLAN:  1. Thyroid cancer - papillary, Hurthle cell variant - I had a long discussion with the patient and her husband about her recent (incidental) diagnosis of thyroid cancer. We reviewed together the pathology >> she is stage 1 TNM  - I reassured her that papillary thyroid cancer is a slow growing cancer with good prognosis; the Hurthle cell variant is a little more aggressive than the classic type of PTC >> I did recommend completion thyroidectomy and RAI tx afterwards >> they agree with this plan. We discussed to first obtain imaging of the R lobe and the cervical LNs >> will order.  - I plan to meet with her 1-1.5 mo after the completion  thyroidectomy to prepare for RAI tx (will use Thyrogen-stimulated RAI tx).  - discussed about the main purpose for RAI tx to allow Thyroglobulin measurement in the future as a more sensitive marker for tumor progression - will check TFTs now to see if she requires LT4 tx after her hemithyroidectomy - discussed about correct intake of LT4 if she needs to start: every day, with water, at least 30 minutes before breakfast, separated by at least 4 hours from: - acid reflux medications - calcium - iron - multivitamins - I will see the patient back in ~2-2.5 months  2. H/o Primary HPTH - s/p resection of a parathyroid adenoma weighing 445 mg - 08/24/2016 - Ca normal the next day, at LLN: 8.9 - she was sent home w/o calcium supplementation - she had repeated Ca + PTH 1 mo later >> will need to get these results  3. Vit D def - will check this today - not on supplement now  Orders Placed This Encounter  Procedures  . US THYROID    Standing Status:   Future    Number of Occurrences:   1    Standing Expiration Date:   12/27/2017    Order Specific Question:   Reason for Exam (SYMPTOM  OR DIAGNOSIS REQUIRED)    Answer:   Thy Ca  - had hemithyroidectomy - preparing for complete thyroidectomy    Order Specific Question:   Preferred imaging location?    Answer:   [facility]  . TSH  . T4, free  . T3, free  . VITAMIN D 25 Hydroxy (Vit-D Deficiency, Fractures)   Component     Latest Ref Rng & Units 10/27/2016  TSH     0.35 - 4.50 uIU/mL 3.50  T4,Free(Direct)     0.60 - 1.60 ng/dL 0.61  Triiodothyronine,Free,Serum     2.3 - 4.2 pg/mL 3.3  VITD     30.00 - 100.00 ng/mL 20.09 (L)   TFTs normal. No need to start LT4.  Vitamin D is low >> will advise to start 5000 units daily. Will recheck level at next visit.  US THYROID  Order: [id number]  Status:  Final result Visible to patient:  No (Not Released) Dx:  Thyroid cancer [facility])  Details   Reading Physician Reading Date  Result Priority  Greggory Keen, MD 11/01/2016   Narrative    CLINICAL DATA: Thyroid cancer, status post left thyroidectomy  EXAM:  THYROID ULTRASOUND  TECHNIQUE: Ultrasound examination of the thyroid gland and adjacent soft tissues was performed.  COMPARISON: 06/29/2016: Medicine scan  FINDINGS: Parenchymal Echotexture: Moderately heterogenous  Isthmus: 3 mm  Right lobe: 4.8 x 1.8 x 1.8 cm  Left lobe: Surgically absent  _________________________________________________________  Estimated total number of nodules >/= 1 cm: 0  Number of spongiform nodules >/= 2 cm not described below (TR1): 0  Number of mixed cystic and solid nodules >/= 1.5 cm not described below (TR2): 0  _________________________________________________________  Mildly heterogeneous right thyroid lobe without discrete measurable nodule or mass. Normal vascularity. Patient is status post left thyroidectomy. Small lymph node noted inferior to the left thyroidectomy bed.  IMPRESSION: Remote left thyroidectomy.  Mildly heterogeneous right thyroid lobe without discrete measurable nodule or mass.  No adenopathy.  The above is in keeping with the ACR TI-RADS recommendations - J Am Coll Radiol 2017;14:587-595.   Electronically Signed By: Jerilynn Mages. Shick M.D. On: 11/01/2016 12:29        [name], MD PhD [facility] Endocrinology

## 2016-10-27 NOTE — Patient Instructions (Addendum)
Please stop at the lab.  We will schedule a neck U/S.  Please look up [email or web address]  Please return in after Christmas.

## 2016-10-29 ENCOUNTER — Telehealth: Payer: Self-pay

## 2016-10-29 NOTE — Telephone Encounter (Signed)
Called patient and gave lab results. Patient had no questions or concerns.  

## 2016-10-29 NOTE — Telephone Encounter (Signed)
-----   Message from [name], MD sent at 10/28/2016  5:38 PM EDT ----- Almyra Free, can you please call pt:  TFTs normal. No need to start LT4.  Vitamin D is low >> will advise to start 5000 units daily (this is OTC). Will recheck level at next visit.

## 2016-11-01 ENCOUNTER — Ambulatory Visit
Admission: RE | Admit: 2016-11-01 | Discharge: 2016-11-01 | Disposition: A | Discharge status: Home / Self Care | Payer: Medicare Other | Source: Ambulatory Visit | Attending: Internal Medicine | Admitting: Internal Medicine

## 2016-11-01 DIAGNOSIS — C73 Malignant neoplasm of thyroid gland: Secondary | ICD-10-CM | POA: Diagnosis not present

## 2016-11-02 ENCOUNTER — Telehealth: Payer: Self-pay

## 2016-11-02 NOTE — Telephone Encounter (Signed)
-----   Message from [name], MD sent at 11/02/2016  8:21 AM EDT ----- Almyra Free, can you please call pt: The R thyroid lobe dose not include any nodules/masses. Also, no abnormal lymph nodes are visible. Good news!

## 2016-11-02 NOTE — Telephone Encounter (Signed)
LVM, gave lab results. Gave call back number if any questions or concerns.  

## 2016-11-10 ENCOUNTER — Ambulatory Visit: Payer: Self-pay | Admitting: Surgery

## 2016-12-03 NOTE — Patient Instructions (Signed)
[name] [name]  12/03/2016   Your procedure is scheduled on: 12-09-16  Report to [facility] Main  Entrance Take [place]  elevators to 3rd floor to  [place] at     [phone].    Call this number if you have problems the morning of surgery [phone]    Remember: ONLY 1 PERSON MAY GO WITH YOU TO SHORT STAY TO GET  READY MORNING OF YOUR SURGERY.  Do not eat food or drink liquids :After Midnight.     Take these medicines the morning of surgery with A SIP OF WATER: NONE                                You may not have any metal on your body including hair pins and              piercings  Do not wear jewelry, make-up, lotions, powders or perfumes, deodorant             Do not wear nail polish.  Do not shave  48 hours prior to surgery.     Do not bring valuables to the hospital. [place].  Contacts, dentures or bridgework may not be worn into surgery.  Leave suitcase in the car. After surgery it may be brought to your room.               Please read over the following fact sheets you were given: _____________________________________________________________________ [facility] - Preparing for Surgery Before surgery, you can play an important role.  Because skin is not sterile, your skin needs to be as free of germs as possible.  You can reduce the number of germs on your skin by washing with CHG (chlorahexidine gluconate) soap before surgery.  CHG is an antiseptic cleaner which kills germs and bonds with the skin to continue killing germs even after washing. Please DO NOT use if you have an allergy to CHG or antibacterial soaps.  If your skin becomes reddened/irritated stop using the CHG and inform your nurse when you arrive at Short Stay. Do not shave (including legs and underarms) for at least 48 hours prior to the first CHG shower.  You may shave your face/neck. Please follow these instructions  carefully:  1.  Shower with CHG Soap the night before surgery and the  morning of Surgery.  2.  If you choose to wash your hair, wash your hair first as usual with your  normal  shampoo.  3.  After you shampoo, rinse your hair and body thoroughly to remove the  shampoo.                           4.  Use CHG as you would any other liquid soap.  You can apply chg directly  to the skin and wash                       Gently with a scrungie or clean washcloth.  5.  Apply the CHG Soap to your body ONLY FROM THE NECK DOWN.   Do not use on face/ open  Wound or open sores. Avoid contact with eyes, ears mouth and genitals (private parts).                       Wash face,  Genitals (private parts) with your normal soap.             6.  Wash thoroughly, paying special attention to the area where your surgery  will be performed.  7.  Thoroughly rinse your body with warm water from the neck down.  8.  DO NOT shower/wash with your normal soap after using and rinsing off  the CHG Soap.                9.  Pat yourself dry with a clean towel.            10.  Wear clean pajamas.            11.  Place clean sheets on your bed the night of your first shower and do not  sleep with pets. Day of Surgery : Do not apply any lotions/deodorants the morning of surgery.  Please wear clean clothes to the hospital/surgery center.  FAILURE TO FOLLOW THESE INSTRUCTIONS MAY RESULT IN THE CANCELLATION OF YOUR SURGERY PATIENT SIGNATURE_________________________________  NURSE SIGNATURE__________________________________  ________________________________________________________________________

## 2016-12-06 ENCOUNTER — Other Ambulatory Visit: Payer: Self-pay

## 2016-12-06 ENCOUNTER — Encounter (HOSPITAL_COMMUNITY): Payer: Self-pay | Admitting: Surgery

## 2016-12-06 ENCOUNTER — Ambulatory Visit (HOSPITAL_COMMUNITY)
Admission: RE | Admit: 2016-12-06 | Discharge: 2016-12-06 | Disposition: A | Discharge status: Home / Self Care | Payer: Medicare Other | Source: Ambulatory Visit | Attending: Anesthesiology | Admitting: Anesthesiology

## 2016-12-06 ENCOUNTER — Encounter (HOSPITAL_COMMUNITY): Payer: Self-pay

## 2016-12-06 ENCOUNTER — Encounter (HOSPITAL_COMMUNITY)
Admission: RE | Admit: 2016-12-06 | Discharge: 2016-12-06 | Disposition: A | Discharge status: Home / Self Care | Payer: Medicare Other | Source: Ambulatory Visit | Attending: Surgery | Admitting: Surgery

## 2016-12-06 DIAGNOSIS — Z01818 Encounter for other preprocedural examination: Secondary | ICD-10-CM

## 2016-12-06 DIAGNOSIS — Z01810 Encounter for preprocedural cardiovascular examination: Secondary | ICD-10-CM | POA: Insufficient documentation

## 2016-12-06 DIAGNOSIS — J9811 Atelectasis: Secondary | ICD-10-CM | POA: Insufficient documentation

## 2016-12-06 DIAGNOSIS — C73 Malignant neoplasm of thyroid gland: Secondary | ICD-10-CM | POA: Insufficient documentation

## 2016-12-06 DIAGNOSIS — I447 Left bundle-branch block, unspecified: Secondary | ICD-10-CM | POA: Diagnosis not present

## 2016-12-06 DIAGNOSIS — J984 Other disorders of lung: Secondary | ICD-10-CM | POA: Insufficient documentation

## 2016-12-06 HISTORY — DX: Prediabetes: R73.03

## 2016-12-06 LAB — BASIC METABOLIC PANEL
Anion gap: 8 (ref 5–15)
BUN: 14 mg/dL (ref 6–20)
CHLORIDE: 104 mmol/L (ref 101–111)
CO2: 27 mmol/L (ref 22–32)
CREATININE: 0.72 mg/dL (ref 0.44–1.00)
Calcium: 9 mg/dL (ref 8.9–10.3)
Glucose, Bld: 88 mg/dL (ref 65–99)
POTASSIUM: 4.1 mmol/L (ref 3.5–5.1)
SODIUM: 139 mmol/L (ref 135–145)

## 2016-12-06 LAB — CBC
HCT: 38.5 % (ref 36.0–46.0)
HEMOGLOBIN: 12.5 g/dL (ref 12.0–15.0)
MCH: 28.2 pg (ref 26.0–34.0)
MCHC: 32.5 g/dL (ref 30.0–36.0)
MCV: 86.9 fL (ref 78.0–100.0)
Platelets: 252 10*3/uL (ref 150–400)
RBC: 4.43 MIL/uL (ref 3.87–5.11)
RDW: 14 % (ref 11.5–15.5)
WBC: 6.6 10*3/uL (ref 4.0–10.5)

## 2016-12-06 NOTE — Progress Notes (Signed)
EKG taken to Dr. Jillyn Hidden anesthesia . Pt. No complaints at preop get around well . Dr. Jillyn Hidden aware of EKG no further orders.

## 2016-12-06 NOTE — H&P (Signed)
General Surgery [facility]  [name] [name] DOB: 06/17/1950 Married / Language: English / Race: White Female   History of Present Illness   The patient is a 66 year old female who presents with thyroid cancer.  CC: Hurthle cell thyroid carcinoma  The patient returns today accompanied by her husband to discuss completion thyroidectomy. Patient had undergone neck exploration for parathyroidectomy. She was found to have a mass in the left thyroid lobe and underwent left thyroid lobectomy in August 2018. Final pathology showed Hurthle cell carcinoma. Patient has been seen in consultation by endocrinology. Dr. [name] has recommended completion thyroidectomy followed by radioactive iodine treatment. Patient presents today to discuss completion thyroidectomy and to make arrangements for surgery in the near future.   Allergies GuaiFENesin *COUGH/COLD/ALLERGY*  No Known Drug Allergies 11/10/2016 (Marked as Inactive) Sulfamethoxazole *SULFONAMIDES*  Allergies Reconciled   Medication History Medications Reconciled Minocycline HCl (100MG  Capsule, Oral) Active. Cetirizine HCl (10MG  Tablet, Oral) Active. Omeprazole (20MG  Capsule DR, Oral) Active.  Vitals Weight: 170.2 lb Height: 64in Body Surface Area: 1.83 m Body Mass Index: 29.21 kg/m  Temp.: 98.76F  Pulse: 93 (Regular)  BP: 140/80 (Sitting, Left Arm, Standard)    Physical Exam  Note:Limited examination  Anterior cervical incision is healed nicely with good cosmetic result. Mild soft tissue swelling remains. No sign of seroma or infection. Voice quality is near normal at conversational level.    Assessment & Plan  HURTHLE CELL CARCINOMA OF THYROID (C73)  Patient returns today accompanied by her husband to discuss completion thyroidectomy. We reviewed her previous surgery and pathology results. We reviewed the recommendations from the endocrinologist. They would  like to proceed with completion thyroidectomy as soon as possible.  We discussed the procedure, the hospital stay, and the possible risk involved with surgery.  The risks and benefits of the procedure have been discussed at length with the patient. The patient understands the proposed procedure, potential alternative treatments, and the course of recovery to be expected. All of the patient's questions have been answered at this time. The patient wishes to proceed with surgery.  Armandina Gemma, [facility] Office: [phone]

## 2016-12-07 LAB — HEMOGLOBIN A1C
Hgb A1c MFr Bld: 6.1 % — ABNORMAL HIGH (ref 4.8–5.6)
Mean Plasma Glucose: 128 mg/dL

## 2016-12-09 ENCOUNTER — Other Ambulatory Visit: Payer: Self-pay

## 2016-12-09 ENCOUNTER — Encounter (HOSPITAL_COMMUNITY): Payer: Self-pay | Admitting: *Deleted

## 2016-12-09 ENCOUNTER — Observation Stay (HOSPITAL_COMMUNITY)
Admission: RE | Admit: 2016-12-09 | Discharge: 2016-12-10 | Disposition: A | Discharge status: Home / Self Care | Payer: Medicare Other | Source: Ambulatory Visit | Attending: Surgery | Admitting: Surgery

## 2016-12-09 ENCOUNTER — Ambulatory Visit (HOSPITAL_COMMUNITY): Payer: Medicare Other

## 2016-12-09 ENCOUNTER — Encounter (HOSPITAL_COMMUNITY)
Admission: RE | Disposition: A | Discharge status: Home / Self Care | Payer: Self-pay | Source: Ambulatory Visit | Attending: Surgery

## 2016-12-09 DIAGNOSIS — Z79899 Other long term (current) drug therapy: Secondary | ICD-10-CM | POA: Insufficient documentation

## 2016-12-09 DIAGNOSIS — I10 Essential (primary) hypertension: Secondary | ICD-10-CM | POA: Diagnosis not present

## 2016-12-09 DIAGNOSIS — Z87891 Personal history of nicotine dependence: Secondary | ICD-10-CM | POA: Insufficient documentation

## 2016-12-09 DIAGNOSIS — Z888 Allergy status to other drugs, medicaments and biological substances status: Secondary | ICD-10-CM | POA: Diagnosis not present

## 2016-12-09 DIAGNOSIS — Z7982 Long term (current) use of aspirin: Secondary | ICD-10-CM | POA: Insufficient documentation

## 2016-12-09 DIAGNOSIS — E063 Autoimmune thyroiditis: Secondary | ICD-10-CM | POA: Insufficient documentation

## 2016-12-09 DIAGNOSIS — Z882 Allergy status to sulfonamides status: Secondary | ICD-10-CM | POA: Insufficient documentation

## 2016-12-09 DIAGNOSIS — K219 Gastro-esophageal reflux disease without esophagitis: Secondary | ICD-10-CM | POA: Insufficient documentation

## 2016-12-09 DIAGNOSIS — M1990 Unspecified osteoarthritis, unspecified site: Secondary | ICD-10-CM | POA: Insufficient documentation

## 2016-12-09 DIAGNOSIS — J45909 Unspecified asthma, uncomplicated: Secondary | ICD-10-CM | POA: Diagnosis not present

## 2016-12-09 DIAGNOSIS — C73 Malignant neoplasm of thyroid gland: Secondary | ICD-10-CM | POA: Diagnosis not present

## 2016-12-09 DIAGNOSIS — E210 Primary hyperparathyroidism: Secondary | ICD-10-CM | POA: Diagnosis not present

## 2016-12-09 DIAGNOSIS — E785 Hyperlipidemia, unspecified: Secondary | ICD-10-CM | POA: Diagnosis not present

## 2016-12-09 HISTORY — PX: THYROIDECTOMY: SHX17

## 2016-12-09 SURGERY — THYROIDECTOMY
Anesthesia: General

## 2016-12-09 MED ORDER — ACETAMINOPHEN 325 MG PO TABS: 650.0000 mg | ORAL_TABLET | Freq: Four times a day (QID) | ORAL | Status: DC | PRN

## 2016-12-09 MED ORDER — 0.9 % SODIUM CHLORIDE (POUR BTL) OPTIME
TOPICAL | Status: DC | PRN
  Administered 2016-12-09: 1000 mL

## 2016-12-09 MED ORDER — KCL IN DEXTROSE-NACL 20-5-0.45 MEQ/L-%-% IV SOLN
INTRAVENOUS | Status: DC
  Administered 2016-12-09: 14:00:00 via INTRAVENOUS
  Filled 2016-12-09 (×2): qty 1000

## 2016-12-09 MED ORDER — ONDANSETRON HCL 4 MG/2ML IJ SOLN
INTRAMUSCULAR | Status: DC | PRN
  Administered 2016-12-09: 4 mg via INTRAVENOUS

## 2016-12-09 MED ORDER — FENTANYL CITRATE (PF) 100 MCG/2ML IJ SOLN
INTRAMUSCULAR | Status: DC | PRN
  Administered 2016-12-09 (×2): 50 ug via INTRAVENOUS
  Administered 2016-12-09: 25 ug via INTRAVENOUS
  Administered 2016-12-09: 50 ug via INTRAVENOUS
  Administered 2016-12-09: 25 ug via INTRAVENOUS

## 2016-12-09 MED ORDER — OXYCODONE HCL 5 MG/5ML PO SOLN: 5.0000 mg | Freq: Once | ORAL | Status: DC | PRN

## 2016-12-09 MED ORDER — GLYCOPYRROLATE 0.2 MG/ML IJ SOLN
INTRAMUSCULAR | Status: DC | PRN
  Administered 2016-12-09: 0.2 mg via INTRAVENOUS

## 2016-12-09 MED ORDER — ONDANSETRON 4 MG PO TBDP: 4.0000 mg | ORAL_TABLET | Freq: Four times a day (QID) | ORAL | Status: DC | PRN

## 2016-12-09 MED ORDER — CEFAZOLIN SODIUM-DEXTROSE 2-4 GM/100ML-% IV SOLN
2.0000 g | INTRAVENOUS | Status: AC
  Administered 2016-12-09: 2 g via INTRAVENOUS
  Filled 2016-12-09: qty 100

## 2016-12-09 MED ORDER — EPHEDRINE SULFATE 50 MG/ML IJ SOLN
INTRAMUSCULAR | Status: DC | PRN
  Administered 2016-12-09: 10 mg via INTRAVENOUS

## 2016-12-09 MED ORDER — FENTANYL CITRATE (PF) 100 MCG/2ML IJ SOLN
INTRAMUSCULAR | Status: AC
  Filled 2016-12-09: qty 2

## 2016-12-09 MED ORDER — TRAMADOL HCL 50 MG PO TABS: 50.0000 mg | ORAL_TABLET | Freq: Four times a day (QID) | ORAL | Status: DC | PRN

## 2016-12-09 MED ORDER — OXYCODONE HCL 5 MG PO TABS: 5.0000 mg | ORAL_TABLET | Freq: Once | ORAL | Status: DC | PRN

## 2016-12-09 MED ORDER — ROCURONIUM BROMIDE 50 MG/5ML IV SOSY
PREFILLED_SYRINGE | INTRAVENOUS | Status: AC
  Filled 2016-12-09: qty 5

## 2016-12-09 MED ORDER — LACTATED RINGERS IV SOLN
INTRAVENOUS | Status: DC
  Administered 2016-12-09: 09:00:00 via INTRAVENOUS

## 2016-12-09 MED ORDER — SUGAMMADEX SODIUM 200 MG/2ML IV SOLN
INTRAVENOUS | Status: DC | PRN
  Administered 2016-12-09: 150 mg via INTRAVENOUS

## 2016-12-09 MED ORDER — CHLORHEXIDINE GLUCONATE CLOTH 2 % EX PADS: 6.0000 | MEDICATED_PAD | Freq: Once | CUTANEOUS | Status: DC

## 2016-12-09 MED ORDER — SUGAMMADEX SODIUM 200 MG/2ML IV SOLN
INTRAVENOUS | Status: AC
  Filled 2016-12-09: qty 2

## 2016-12-09 MED ORDER — ACETAMINOPHEN 650 MG RE SUPP: 650.0000 mg | Freq: Four times a day (QID) | RECTAL | Status: DC | PRN

## 2016-12-09 MED ORDER — HYDROMORPHONE HCL 1 MG/ML IJ SOLN
0.2500 mg | INTRAMUSCULAR | Status: DC | PRN
  Administered 2016-12-09 (×4): 0.5 mg via INTRAVENOUS

## 2016-12-09 MED ORDER — DEXAMETHASONE SODIUM PHOSPHATE 10 MG/ML IJ SOLN
INTRAMUSCULAR | Status: DC | PRN
  Administered 2016-12-09: 10 mg via INTRAVENOUS

## 2016-12-09 MED ORDER — PROPOFOL 10 MG/ML IV BOLUS
INTRAVENOUS | Status: DC | PRN
  Administered 2016-12-09: 100 mg via INTRAVENOUS

## 2016-12-09 MED ORDER — DEXAMETHASONE SODIUM PHOSPHATE 10 MG/ML IJ SOLN
INTRAMUSCULAR | Status: AC
  Filled 2016-12-09: qty 1

## 2016-12-09 MED ORDER — FENTANYL CITRATE (PF) 100 MCG/2ML IJ SOLN
INTRAMUSCULAR | Status: AC
  Filled 2016-12-09: qty 4

## 2016-12-09 MED ORDER — HYDROMORPHONE HCL 1 MG/ML IJ SOLN
1.0000 mg | INTRAMUSCULAR | Status: DC | PRN
  Administered 2016-12-09: 1 mg via INTRAVENOUS
  Filled 2016-12-09: qty 1

## 2016-12-09 MED ORDER — HYDROMORPHONE HCL 1 MG/ML IJ SOLN
INTRAMUSCULAR | Status: AC
  Filled 2016-12-09: qty 2

## 2016-12-09 MED ORDER — ONDANSETRON HCL 4 MG/2ML IJ SOLN
4.0000 mg | Freq: Four times a day (QID) | INTRAMUSCULAR | Status: DC | PRN
  Administered 2016-12-09: 4 mg via INTRAVENOUS
  Filled 2016-12-09: qty 2

## 2016-12-09 MED ORDER — ONDANSETRON HCL 4 MG/2ML IJ SOLN
INTRAMUSCULAR | Status: AC
  Filled 2016-12-09: qty 2

## 2016-12-09 MED ORDER — PROPOFOL 10 MG/ML IV BOLUS
INTRAVENOUS | Status: AC
  Filled 2016-12-09: qty 20

## 2016-12-09 MED ORDER — ROCURONIUM BROMIDE 50 MG/5ML IV SOSY
PREFILLED_SYRINGE | INTRAVENOUS | Status: DC | PRN
  Administered 2016-12-09: 50 mg via INTRAVENOUS

## 2016-12-09 MED ORDER — BUPIVACAINE HCL (PF) 0.25 % IJ SOLN
INTRAMUSCULAR | Status: AC
  Filled 2016-12-09: qty 30

## 2016-12-09 MED ORDER — LIDOCAINE 2% (20 MG/ML) 5 ML SYRINGE
INTRAMUSCULAR | Status: AC
  Filled 2016-12-09: qty 5

## 2016-12-09 MED ORDER — LIDOCAINE 2% (20 MG/ML) 5 ML SYRINGE
INTRAMUSCULAR | Status: DC | PRN
  Administered 2016-12-09: 80 mg via INTRAVENOUS

## 2016-12-09 SURGICAL SUPPLY — 36 items
ATTRACTOMAT 16X20 MAGNETIC DRP (DRAPES) ×2 IMPLANT
BLADE SURG 15 STRL LF DISP TIS (BLADE) ×1 IMPLANT
BLADE SURG 15 STRL SS (BLADE) ×2
CHLORAPREP W/TINT 26ML (MISCELLANEOUS) ×4 IMPLANT
CLIP VESOCCLUDE MED 6/CT (CLIP) ×4 IMPLANT
CLIP VESOCCLUDE SM WIDE 6/CT (CLIP) ×5 IMPLANT
COVER SURGICAL LIGHT HANDLE (MISCELLANEOUS) ×2 IMPLANT
DISSECTOR ROUND CHERRY 3/8 STR (MISCELLANEOUS) IMPLANT
DRAPE LAPAROTOMY T 98X78 PEDS (DRAPES) ×2 IMPLANT
ELECT PENCIL ROCKER SW 15FT (MISCELLANEOUS) ×2 IMPLANT
ELECT REM PT RETURN 15FT ADLT (MISCELLANEOUS) ×2 IMPLANT
GAUZE SPONGE 4X4 12PLY STRL (GAUZE/BANDAGES/DRESSINGS) ×1 IMPLANT
GAUZE SPONGE 4X4 16PLY XRAY LF (GAUZE/BANDAGES/DRESSINGS) ×2 IMPLANT
GLOVE SURG ORTHO 8.0 STRL STRW (GLOVE) ×2 IMPLANT
GOWN STRL REUS W/TWL XL LVL3 (GOWN DISPOSABLE) ×4 IMPLANT
HEMOSTAT SURGICEL 2X4 FIBR (HEMOSTASIS) IMPLANT
ILLUMINATOR WAVEGUIDE N/F (MISCELLANEOUS) ×1 IMPLANT
KIT BASIN OR (CUSTOM PROCEDURE TRAY) ×2 IMPLANT
LIGHT WAVEGUIDE WIDE FLAT (MISCELLANEOUS) IMPLANT
PACK BASIC VI WITH GOWN DISP (CUSTOM PROCEDURE TRAY) ×2 IMPLANT
POWDER SURGICEL 3.0 GRAM (HEMOSTASIS) IMPLANT
SHEARS HARMONIC 9CM CVD (BLADE) ×2 IMPLANT
STAPLER VISISTAT 35W (STAPLE) IMPLANT
STRIP CLOSURE SKIN 1/2X4 (GAUZE/BANDAGES/DRESSINGS) ×2 IMPLANT
SUT MNCRL AB 4-0 PS2 18 (SUTURE) ×2 IMPLANT
SUT SILK 2 0 (SUTURE)
SUT SILK 2-0 18XBRD TIE 12 (SUTURE) IMPLANT
SUT SILK 3 0 (SUTURE)
SUT SILK 3-0 18XBRD TIE 12 (SUTURE) IMPLANT
SUT VIC AB 3-0 SH 18 (SUTURE) ×5 IMPLANT
SYR BULB IRRIGATION 50ML (SYRINGE) ×2 IMPLANT
TAPE PAPER MEDFIX 1IN X 10YD (GAUZE/BANDAGES/DRESSINGS) ×1 IMPLANT
TOWEL OR 17X26 10 PK STRL BLUE (TOWEL DISPOSABLE) ×2 IMPLANT
TOWEL OR NON WOVEN STRL DISP B (DISPOSABLE) ×2 IMPLANT
TUBING CONNECTING 10 (TUBING) ×1 IMPLANT
YANKAUER SUCT BULB TIP 10FT TU (MISCELLANEOUS) ×2 IMPLANT

## 2016-12-09 NOTE — Anesthesia Postprocedure Evaluation (Signed)
Anesthesia Post Note  Patient: [name] [name]  Procedure(s) Performed: COMPLETION THYROIDECTOMY RIGHT LOBE (N/A )     Patient location during evaluation: PACU Anesthesia Type: General Level of consciousness: awake and alert Pain management: pain level controlled Vital Signs Assessment: post-procedure vital signs reviewed and stable Respiratory status: spontaneous breathing, nonlabored ventilation, respiratory function stable and patient connected to nasal cannula oxygen Cardiovascular status: blood pressure returned to baseline and stable Postop Assessment: no apparent nausea or vomiting Anesthetic complications: no    Last Vitals:  Vitals:   12/09/16 1313 12/09/16 1418  BP: (!) 149/79 133/71  Pulse:  70  Resp: 12 15  Temp: 36.6 C 36.7 C  SpO2:  96%    Last Pain:  Vitals:   12/09/16 1418  TempSrc: Oral  PainSc:                  Mysha Peeler,JAMES TERRILL

## 2016-12-09 NOTE — Interval H&P Note (Signed)
History and Physical Interval Note:  12/09/2016 10:03 AM  [name] [name]  has presented today for surgery, with the diagnosis of HURTHLE CELL THYROID CARCINOMA.  The various methods of treatment have been discussed with the patient and family. After consideration of risks, benefits and other options for treatment, the patient has consented to    Procedure(s): COMPLETION THYROIDECTOMY RIGHT LOBE (N/A) as a surgical intervention .    The patient's history has been reviewed, patient examined, no change in status, stable for surgery.  I have reviewed the patient's chart and labs.  Questions were answered to the patient's satisfaction.    Armandina Gemma, [facility] Office: [place]

## 2016-12-09 NOTE — Transfer of Care (Signed)
Immediate Anesthesia Transfer of Care Note  Patient: [name] [name]  Procedure(s) Performed: COMPLETION THYROIDECTOMY RIGHT LOBE (N/A )  Patient Location: PACU  Anesthesia Type:General  Level of Consciousness: awake, alert , oriented and patient cooperative  Airway & Oxygen Therapy: Patient Spontanous Breathing and Patient connected to face mask oxygen  Post-op Assessment: Report given to RN and Post -op Vital signs reviewed and stable  Post vital signs: Reviewed and stable  Last Vitals:  Vitals:   12/09/16 0840  BP: (!) 149/62  Pulse: (!) 52  Resp: 16  Temp: 36.6 C  SpO2: 100%    Last Pain:  Vitals:   12/09/16 0840  TempSrc: Oral      Patients Stated Pain Goal: 3 (48/62/82 4175)  Complications: No apparent anesthesia complications

## 2016-12-09 NOTE — Anesthesia Preprocedure Evaluation (Addendum)
Anesthesia Evaluation  Patient identified by MRN, date of birth, ID band Patient awake    Reviewed: Allergy & Precautions, NPO status , Patient's Chart, lab work & pertinent test results  History of Anesthesia Complications Negative for: history of anesthetic complications  Airway Mallampati: II  TM Distance: >3 FB Neck ROM: Full    Dental no notable dental hx.    Pulmonary shortness of breath, asthma , former smoker,    breath sounds clear to auscultation       Cardiovascular hypertension,  Rhythm:Regular Rate:Normal     Neuro/Psych  Neuromuscular disease    GI/Hepatic Neg liver ROS, GERD  ,  Endo/Other  negative endocrine ROSThyroid ca  Renal/GU negative Renal ROS     Musculoskeletal  (+) Arthritis ,   Abdominal   Peds  Hematology negative hematology ROS (+)   Anesthesia Other Findings   Reproductive/Obstetrics                            Anesthesia Physical Anesthesia Plan  ASA: II  Anesthesia Plan: General   Post-op Pain Management:    Induction: Intravenous  PONV Risk Score and Plan: 4 or greater and Treatment may vary due to age or medical condition, Ondansetron and Dexamethasone  Airway Management Planned: Oral ETT  Additional Equipment:   Intra-op Plan:   Post-operative Plan: Extubation in OR  Informed Consent: I have reviewed the patients History and Physical, chart, labs and discussed the procedure including the risks, benefits and alternatives for the proposed anesthesia with the patient or authorized representative who has indicated his/her understanding and acceptance.   Dental advisory given  Plan Discussed with: CRNA  Anesthesia Plan Comments:        Anesthesia Quick Evaluation

## 2016-12-09 NOTE — Brief Op Note (Signed)
12/09/2016  11:54 AM  PATIENT:  Barbee Cough Pant  66 y.o. female  PRE-OPERATIVE DIAGNOSIS:  HURTHLE CELL THYROID CARCINOMA  POST-OPERATIVE DIAGNOSIS:  HURTHLE CELL THYROID CARCINOMA  PROCEDURE:  Completion thyroidectomy (right thyroid lobe and isthmus)  SURGEON:  Surgeon(s) and Role:    Armandina Gemma, MD - Primary  ANESTHESIA:   general  EBL:  50 mL   BLOOD ADMINISTERED:none  DRAINS: none   LOCAL MEDICATIONS USED:  NONE  SPECIMEN:  Excision  DISPOSITION OF SPECIMEN:  PATHOLOGY  COUNTS:  YES  TOURNIQUET:  * No tourniquets in log *  DICTATION: .Other Dictation: Dictation Number [id number]  PLAN OF CARE: Admit for overnight observation  PATIENT DISPOSITION:  PACU - hemodynamically stable.   Delay start of Pharmacological VTE agent (>24hrs) due to surgical blood loss or risk of bleeding: yes  Armandina Gemma, MD [facility] Office: [phone]

## 2016-12-09 NOTE — Anesthesia Procedure Notes (Signed)
Procedure Name: Intubation Date/Time: 12/09/2016 10:22 AM Performed by: Dione Booze, CRNA Pre-anesthesia Checklist: Patient identified, Emergency Drugs available, Suction available and Patient being monitored Patient Re-evaluated:Patient Re-evaluated prior to induction Oxygen Delivery Method: Circle system utilized Preoxygenation: Pre-oxygenation with 100% oxygen Induction Type: IV induction Ventilation: Mask ventilation without difficulty and Oral airway inserted - appropriate to patient size Laryngoscope Size: Mac and 3 Grade View: Grade I Tube type: Oral Tube size: 7.5 mm Number of attempts: 1 Airway Equipment and Method: Stylet Placement Confirmation: ETT inserted through vocal cords under direct vision,  positive ETCO2 and breath sounds checked- equal and bilateral Secured at: 22 cm Tube secured with: Tape Dental Injury: Teeth and Oropharynx as per pre-operative assessment

## 2016-12-10 DIAGNOSIS — K219 Gastro-esophageal reflux disease without esophagitis: Secondary | ICD-10-CM | POA: Diagnosis not present

## 2016-12-10 DIAGNOSIS — I10 Essential (primary) hypertension: Secondary | ICD-10-CM | POA: Diagnosis not present

## 2016-12-10 DIAGNOSIS — C73 Malignant neoplasm of thyroid gland: Secondary | ICD-10-CM | POA: Diagnosis not present

## 2016-12-10 DIAGNOSIS — Z882 Allergy status to sulfonamides status: Secondary | ICD-10-CM | POA: Diagnosis not present

## 2016-12-10 DIAGNOSIS — Z7982 Long term (current) use of aspirin: Secondary | ICD-10-CM | POA: Diagnosis not present

## 2016-12-10 DIAGNOSIS — J45909 Unspecified asthma, uncomplicated: Secondary | ICD-10-CM | POA: Diagnosis not present

## 2016-12-10 DIAGNOSIS — Z79899 Other long term (current) drug therapy: Secondary | ICD-10-CM | POA: Diagnosis not present

## 2016-12-10 DIAGNOSIS — E063 Autoimmune thyroiditis: Secondary | ICD-10-CM | POA: Diagnosis not present

## 2016-12-10 DIAGNOSIS — Z888 Allergy status to other drugs, medicaments and biological substances status: Secondary | ICD-10-CM | POA: Diagnosis not present

## 2016-12-10 DIAGNOSIS — M1990 Unspecified osteoarthritis, unspecified site: Secondary | ICD-10-CM | POA: Diagnosis not present

## 2016-12-10 DIAGNOSIS — Z87891 Personal history of nicotine dependence: Secondary | ICD-10-CM | POA: Diagnosis not present

## 2016-12-10 LAB — BASIC METABOLIC PANEL
Anion gap: 8 (ref 5–15)
BUN: 11 mg/dL (ref 6–20)
CHLORIDE: 103 mmol/L (ref 101–111)
CO2: 28 mmol/L (ref 22–32)
CREATININE: 0.64 mg/dL (ref 0.44–1.00)
Calcium: 8.5 mg/dL — ABNORMAL LOW (ref 8.9–10.3)
Glucose, Bld: 141 mg/dL — ABNORMAL HIGH (ref 65–99)
POTASSIUM: 4.2 mmol/L (ref 3.5–5.1)
SODIUM: 139 mmol/L (ref 135–145)

## 2016-12-10 MED ORDER — LEVOTHYROXINE SODIUM 100 MCG PO TABS: 100.0000 ug | ORAL_TABLET | Freq: Every day | ORAL | 3 refills | Status: DC

## 2016-12-10 MED ORDER — TRAMADOL HCL 50 MG PO TABS: 50.0000 mg | ORAL_TABLET | Freq: Four times a day (QID) | ORAL | 0 refills | Status: DC | PRN

## 2016-12-10 NOTE — Op Note (Signed)
NAMEMarland Kitchen  [name], [name] [name] NO.:  [id number]  MEDICAL RECORD NO.:  [id number]  LOCATION:                                 FACILITY:  PHYSICIAN:  [name] Regal, MD      DATE OF BIRTH:  [birth year]  DATE OF PROCEDURE:  12/09/2016                              OPERATIVE REPORT   PREOPERATIVE DIAGNOSIS:  Hurthle cell carcinoma of the thyroid.  POSTOPERATIVE DIAGNOSIS:  Hurthle cell carcinoma of the thyroid.  PROCEDURE:  Completion thyroidectomy (right thyroid lobe and isthmus.)  SURGEON:  [name] Regal, MD  ANESTHESIA:  General.  ESTIMATED BLOOD LOSS:  50 mL.  PREPARATION:  ChloraPrep.  COMPLICATIONS:  None.  INDICATIONS:  The patient is a 66 year old female who had undergone neck exploration for parathyroidectomy in August 2018.  At the time of parathyroidectomy, the patient was noted to have a left thyroid mass. Left thyroid lobectomy was performed.  Final pathology showed a Hurthle cell carcinoma.  The patient was seen in consultation by Endocrinology, Dr. Philemon [name].  Recommendation was made for completion thyroidectomy in preparation for radioactive iodine treatment.  The patient now returns to the operating room for completion thyroidectomy with removal of the remaining right thyroid lobe and isthmus.  BODY OF REPORT:  Procedure was done in OR #5 at the [facility].  The patient was brought to the operating room and placed in supine position on the operating room table.  Following administration of general anesthesia, the patient was positioned and then prepped and draped in the usual aseptic fashion.  After ascertaining that an adequate level of anesthesia had been achieved, the patient's previous Kocher incision was reopened with a #15 blade.  Dissection was carried through subcutaneous tissues and platysma.  Hemostasis was achieved with the electrocautery. Subplatysmal flaps were reopened cephalad and caudad, and a  Mahorner self-retaining retractor placed for exposure.  Strap muscles were incised in the midline.  Dissection was begun on the right side.  There was moderate scar tissue present.  Thyroid gland was identified.  Strap muscles were reflected off the right thyroid lobe.  Right lobe was exposed and mobilized laterally.  Middle thyroid vein was divided between Ligaclips with the Harmonic scalpel.  Superior pole was dissected out and superior pole vessels were divided individually between small and medium Ligaclips with the Harmonic scalpel.  There was a small pyramidal lobe present, which was resected off the thyroid cartilage.  Gland was rolled anteriorly.  Branches of the inferior thyroid artery were divided between small Ligaclips with the Harmonic scalpel.  Inferior parathyroid gland was positively identified and preserved on its vascular pedicle.  Recurrent laryngeal nerve was identified and preserved.  Gland was rolled further anteriorly until the ligament of Gwenlyn Found could be transected and the gland mobilized onto the anterior trachea.  The remainder of the pyramidal lobe and isthmus was then resected off the anterior trachea and thyroid cartilage, and resected en bloc with the specimen.  The specimen was submitted to Pathology, labeled as right thyroid lobe.  The neck was irrigated with warm saline and good hemostasis was noted throughout the operative field.  Fibrillar was placed throughout the operative field.  Strap  muscles were reapproximated in the midline with interrupted 3-0 Vicryl sutures.  Platysma was closed with interrupted 3- 0 Vicryl sutures.  The skin was closed with a running 4-0 Monocryl subcuticular suture.  Wound was washed and dried.  Steri-Strips were applied.  Sterile dressings were applied.  The patient was awakened from anesthesia and brought to the recovery room.  The patient tolerated the procedure well.   [name] Gemma, MD [facility] Office:  [phone]   TMG/MEDQ  D:  12/09/2016  T:  12/09/2016  Job:  [id number]  cc:   [name] [name], M.D. Fax: [phone]

## 2016-12-10 NOTE — Discharge Summary (Signed)
Physician Discharge Summary [facility]  Patient ID: [name] [name] MRN: [id number] DOB/AGE: 68-Apr-1952 68 y.o.  Admit date: 12/09/2016 Discharge date: 12/10/2016  Admission Diagnoses:  Hurthle cell carcinoma of thyroid  Discharge Diagnoses:  Principal Problem:   Thyroid cancer ([place]) Active Problems:   Thyroid carcinoma [facility])   Discharged Condition: good  Hospital Course: Patient was admitted for observation following thyroid surgery.  Post op course was uncomplicated.  Pain was well controlled.  Tolerated diet.  Post op calcium level on morning following surgery was 8.5 mg/dl.  Patient was prepared for discharge home on POD#1.  Consults: None  Treatments: surgery: completion thyroidectomy  Discharge Exam: Blood pressure 115/60, pulse (!) 59, temperature 97.8 F (36.6 C), resp. rate 16, height 5\' 4"  (1.626 m), weight 76.2 kg (168 lb), SpO2 98 %. HEENT - clear Neck - wound dry and intact; mild STS; voice normal Chest - clear bilaterally Cor - RRR  Disposition: Home  Discharge Instructions    Diet - low sodium heart healthy   Complete by:  As directed    Discharge instructions   Complete by:  As directed    [facility]  THYROID & PARATHYROID SURGERY:  POST-OP INSTRUCTIONS  Always review your discharge instruction sheet from the facility where your surgery was performed.  A prescription for pain medication may be given to you upon discharge.  Take your pain medication as prescribed.  If narcotic pain medicine is not needed, then you may take acetaminophen (Tylenol) or ibuprofen (Advil) as needed.  Take your usually prescribed medications unless otherwise directed.  If you need a refill on your pain medication, please contact our office during regular business hours.  Prescriptions will not be processed by our office after 5 pm or on weekends.  Start with a light diet upon arrival home, such as soup and crackers or toast.  Be  sure to drink plenty of fluids daily.  Resume your normal diet the day after surgery.  Most patients will experience some swelling and bruising on the chest and neck area.  Ice packs will help.  Swelling and bruising can take several days to resolve.   It is common to experience some constipation after surgery.  Increasing fluid intake and taking a stool softener (Colace) will usually help or prevent this problem.  A mild laxative (Milk of Magnesia or Miralax) should be taken according to package directions if there has been no bowel movement after 48 hours.  You have steri-strips and a gauze dressing over your incision.  You may remove the gauze bandage on the second day after surgery, and you may shower at that time.  Leave your steri-strips (small skin tapes) in place directly over the incision.  These strips should remain on the skin for 5-7 days and then be removed.  You may get them wet in the shower and pat them dry.  You may resume regular (light) daily activities beginning the next day - such as daily self-care, walking, climbing stairs - gradually increasing activities as tolerated.  You may have sexual intercourse when it is comfortable.  Refrain from any heavy lifting or straining until approved by your doctor.  You may drive when you no longer are taking prescription pain medication, you can comfortably wear a seatbelt, and you can safely maneuver your car and apply brakes.  You should see your doctor in the office for a follow-up appointment approximately three weeks after your surgery.  Make sure that  you call for this appointment within a day or two after you arrive home to insure a convenient appointment time.  WHEN TO CALL YOUR DOCTOR: -- Fever greater than 101.5 -- Inability to urinate -- Nausea and/or vomiting - persistent -- Extreme swelling or bruising -- Continued bleeding from incision -- Increased pain, redness, or drainage from the incision -- Difficulty swallowing or  breathing -- Muscle cramping or spasms -- Numbness or tingling in hands or around lips  The clinic staff is available to answer your questions during regular business hours.  Please don't hesitate to call and ask to speak to one of the nurses if you have concerns.  Earnstine Regal, MD, [facility] Office: [phone]  Website: [email or web address]   Increase activity slowly   Complete by:  As directed    Remove dressing in 24 hours   Complete by:  As directed      Allergies as of 12/10/2016      Reactions   Guaifenesin & Derivatives Nausea And Vomiting   Tremors, anxiety   Sulfamethoxazole Other (See Comments)   Pt states it doesn't work and she doesn't want to take it       Medication List    TAKE these medications   aspirin EC 81 MG tablet Take 1 tablet (81 mg total) by mouth daily.   Biotin 2500 MCG Caps Take 5,000 mcg by mouth every evening.   bisacodyl 5 MG EC tablet Commonly known as:  DULCOLAX Take 5 mg by mouth at bedtime.   cetirizine 10 MG tablet Commonly known as:  ZYRTEC Take 10 mg by mouth daily as needed for allergies.   HYDROcodone-acetaminophen 5-325 MG tablet Commonly known as:  NORCO/VICODIN Take 1-2 tablets by mouth every 4 (four) hours as needed for moderate pain.   levothyroxine 100 MCG tablet Commonly known as:  SYNTHROID Take 1 tablet (100 mcg total) by mouth daily.   minocycline 100 MG capsule Commonly known as:  MINOCIN,DYNACIN Take 100 mg by mouth See admin instructions. Takes 100mg  daily for 2 weeks when she has "an outbreak".   naproxen sodium 220 MG tablet Commonly known as:  ALEVE Take 220-440 mg by mouth 2 (two) times daily as needed (depends on pain if takes 1-2 tablets).   omeprazole 20 MG capsule Commonly known as:  PRILOSEC Take 20 mg by mouth at bedtime.   THERATEARS OP Place 1 drop into both eyes 2 (two) times daily as needed (dry eyes).   traMADol 50 MG  tablet Commonly known as:  ULTRAM Take 1-2 tablets (50-100 mg total) by mouth every 6 (six) hours as needed for moderate pain or severe pain.   Vitamin D3 5000 units Caps Take 5,000 Units by mouth every 7 (seven) days. Monday      Follow-up Information    Armandina Gemma, MD. Schedule an appointment as soon as possible for a visit in 3 week(s).   Specialty:  General Surgery Contact information: [address] [place] [address] [phone]           Earnstine Regal, MD, [facility] Office: [phone]   Signed: Earnstine Regal 12/10/2016, 1:11 PM

## 2016-12-10 NOTE — Progress Notes (Signed)
Discharged instruction gives. Patient / family verbalized understand. Prescription given to patient.

## 2016-12-14 NOTE — Progress Notes (Signed)
[name] [name] - notify patient that completion thyroidectomy specimen was negative for further malignancy.  Armandina Gemma, [facility] Office: [phone]

## 2016-12-20 DIAGNOSIS — E210 Primary hyperparathyroidism: Secondary | ICD-10-CM | POA: Diagnosis not present

## 2016-12-20 DIAGNOSIS — C73 Malignant neoplasm of thyroid gland: Secondary | ICD-10-CM | POA: Diagnosis not present

## 2016-12-20 DIAGNOSIS — K219 Gastro-esophageal reflux disease without esophagitis: Secondary | ICD-10-CM | POA: Diagnosis not present

## 2016-12-20 DIAGNOSIS — D351 Benign neoplasm of parathyroid gland: Secondary | ICD-10-CM | POA: Diagnosis not present

## 2016-12-22 DIAGNOSIS — Z0001 Encounter for general adult medical examination with abnormal findings: Secondary | ICD-10-CM | POA: Diagnosis not present

## 2016-12-22 DIAGNOSIS — Z9189 Other specified personal risk factors, not elsewhere classified: Secondary | ICD-10-CM | POA: Diagnosis not present

## 2016-12-22 DIAGNOSIS — L718 Other rosacea: Secondary | ICD-10-CM | POA: Diagnosis not present

## 2016-12-22 DIAGNOSIS — J301 Allergic rhinitis due to pollen: Secondary | ICD-10-CM | POA: Diagnosis not present

## 2016-12-22 DIAGNOSIS — Z1212 Encounter for screening for malignant neoplasm of rectum: Secondary | ICD-10-CM | POA: Diagnosis not present

## 2016-12-22 DIAGNOSIS — K219 Gastro-esophageal reflux disease without esophagitis: Secondary | ICD-10-CM | POA: Diagnosis not present

## 2016-12-31 ENCOUNTER — Encounter: Payer: Self-pay | Admitting: Internal Medicine

## 2016-12-31 ENCOUNTER — Other Ambulatory Visit: Payer: Self-pay | Admitting: Internal Medicine

## 2016-12-31 ENCOUNTER — Telehealth: Payer: Self-pay

## 2016-12-31 MED ORDER — LEVOTHYROXINE SODIUM 112 MCG PO TABS: 112.0000 ug | ORAL_TABLET | Freq: Every day | ORAL | 3 refills | Status: DC

## 2016-12-31 NOTE — Telephone Encounter (Signed)
-----   Message from [name], MD sent at 12/31/2016  1:06 PM EST ----- Loma Sousa, can you please call pt:  Patient had a TSH on 12/21/2016 by PCP, which was elevated at 15.3.  She is currently on levothyroxine 100 mcg daily.  We will need to increase this dose to 112 mcg daily. I will see her back in January. I sent the new dose to her pharmacy. Sincerely, [name] MD

## 2016-12-31 NOTE — Telephone Encounter (Signed)
Pt is aware.  

## 2016-12-31 NOTE — Progress Notes (Signed)
Patient had completion thyroidectomy for Hurthle cell thyroid cancer on 12/09/2016 with Dr. Harlow Asa. She had a TSH on 12/21/2016 by PCP, which was elevated at 15.3.  She is currently on levothyroxine 100 mcg daily.  We will increase this dose to 112 mcg daily, but I expect that we will need to increase it even more after next check.  I will see the patient back in 3 weeks.

## 2017-01-10 DIAGNOSIS — Z23 Encounter for immunization: Secondary | ICD-10-CM | POA: Diagnosis not present

## 2017-01-19 ENCOUNTER — Ambulatory Visit: Payer: Medicare Other | Admitting: Internal Medicine

## 2017-01-19 ENCOUNTER — Encounter: Payer: Self-pay | Admitting: Internal Medicine

## 2017-01-19 VITALS — BP 140/80 | HR 81 | Ht 65.0 in | Wt 173.0 lb

## 2017-01-19 DIAGNOSIS — E559 Vitamin D deficiency, unspecified: Secondary | ICD-10-CM | POA: Diagnosis not present

## 2017-01-19 DIAGNOSIS — C73 Malignant neoplasm of thyroid gland: Secondary | ICD-10-CM

## 2017-01-19 DIAGNOSIS — E890 Postprocedural hypothyroidism: Secondary | ICD-10-CM | POA: Diagnosis not present

## 2017-01-19 DIAGNOSIS — E210 Primary hyperparathyroidism: Secondary | ICD-10-CM | POA: Diagnosis not present

## 2017-01-19 LAB — VITAMIN D 25 HYDROXY (VIT D DEFICIENCY, FRACTURES): VITD: 23.22 ng/mL — AB (ref 30.00–100.00)

## 2017-01-19 LAB — MAGNESIUM: MAGNESIUM: 2.2 mg/dL (ref 1.5–2.5)

## 2017-01-19 NOTE — Progress Notes (Signed)
Patient ID: [name] [name], female   DOB: [birth year], 67 y.o.   MRN: [id number]    HPI  [name] [name] is a 67 y.o.-year-old female, initially referred by Dr. Harlow Asa, now returning for management of thyroid cancer, postsurgical hypothyroidism, history of primary hyperparathyroidism (s/p parathyroidectomy).  Last visit 3 months ago.  Reviewed history: Pt. has been dx with thyroid cancer in 08/2016, after having had neck exploration, left superior parathyroidectomy, and, as the mass was observed in the left thyroid lobe during surgery, she also had left lobectomy. 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 This was 2 cm, positive surgical margins.  Diagnosis 1. Parathyroid gland, Left Superior - ENLARGED PARATHYROID TISSUE, 0.445 GRAMS. 2. Thyroid, lobectomy, Left - PAPILLARY THYROID CARCINOMA, HURTHLE CELL VARIANT, 2.0 CM. - CARCINOMA IS PRESENT AT NON ORIENTED TISSUE EDGE(S). - SEE ONCOLOGY TABLE BELOW Microscopic Comment 2. THYROID Specimen: Left thyroid Procedure (including lymph node sampling if applicable): Hemithyroidectomy Specimen Integrity (intact/fragmented): Focally disrupted Tumor focality: Unifocal Dominant tumor: Maximum tumor size (cm): 2.0 cm (gross measurement) Tumor laterality: Left thyroid Histologic type (including subtype and/or unique features as applicable): Papillary thyroid carcinoma, Hurthle cell variant Tumor capsule: Not identified Extrathyroidal extension: Not identified Capsular invasion with degree of invasion if present: N/A Margins: Present at non-oriented tissue edge(s). Lymphatic or vascular invasion: Not identified Lymph nodes: # examined 0; # positive; N/A Extracapsular extension (if applicable): Not identified TNM code: pT1b, pNX Non-neoplastic thyroid: Hashimoto's thyroiditis  She had completion thyroidectomy on 12/09/2016: Right thyroid lobe without malignancy.  For her  postsurgical hypothyroidism she was started on levothyroxine 100 mcg  >> now 112 mcg: - in am - fasting - at least 30 min from coffee - at least 30 min from b'fast - no Ca, Fe, MVI, PPIs - on Biotin - last dose last night (!)  I reviewed pt's thyroid tests: 12/21/2016: TSH 15.3 Lab Results  Component Value Date   TSH 3.50 10/27/2016   TSH 0.945 07/23/2014   FREET4 0.61 10/27/2016   Pt denies: - feeling nodules in neck - dysphagia - choking - SOB with lying down But has: - hoarseness  She has + FH of thyroid disorders in: sister. No FH of thyroid cancer. No h/o radiation tx to head or neck.  No seaweed or kelp. No recent contrast studies. No herbal supplements. + Biotin use. No recent steroids use.   She also has vitamin D deficiency and we started 5000 units of vitamin D at last visit. Lab Results  Component Value Date   VD25OH 20.09 (L) 10/27/2016   ROS: Constitutional: + weight gain/no weight loss, + fatigue, + hot flushes Eyes: no blurry vision, no xerophthalmia ENT: no sore throat, + see HPI Cardiovascular: no CP/no SOB/no palpitations/+ leg swelling Respiratory: no cough/no SOB/no wheezing Gastrointestinal: no N/no V/no D/+ C/+ acid reflux Musculoskeletal: no muscle aches/no joint aches Skin: no rashes, no hair loss Neurological: no tremors/no numbness/no tingling/no dizziness  I reviewed pt's medications, allergies, PMH, social hx, family hx, and changes were documented in the history of present illness. Otherwise, unchanged from my initial visit note.  Past Medical History:  Diagnosis Date  . Anxiety    because of parathyroid issues  . Arthritis    hands  . Asthma    x 2, triggered by allergies  . Cancer ([place])    melanoma removed from back, thyroid  . Depression    bc of parathyroid problems  . GERD (gastroesophageal  reflux disease)   . History of palpitations    none since 2016  . Pre-diabetes   . Seasonal allergies   . Varicose vein    Past  Surgical History:  Procedure Laterality Date  . CARPAL TUNNEL RELEASE Right 02/05/2014   Procedure: RIGHT CARPAL TUNNEL RELEASE;  Surgeon: Jessy Oto, MD;  Location: [facility];  Service: Orthopedics;  Laterality: Right;  . COLONOSCOPY    . EYE SURGERY     Bilateral Lasik  . LAPAROSCOPIC ASSISTED VAGINAL HYSTERECTOMY Bilateral 10/11/2013   Procedure: LAPAROSCOPIC ASSISTED VAGINAL HYSTERECTOMY BILATERAL SALPINGO OOPHORECTOMY;  Surgeon: Allena Katz, MD;  Location: [facility];  Service: Gynecology;  Laterality: Bilateral;  . Malignant melanoma resected     lower back area  . PARATHYROIDECTOMY Left 08/24/2016   Procedure: NECK EXPLORATION LEFT SUPERIOR PARATHYROIDECTOMY LEFT LOBE THYROIDECTOMY;  Surgeon: Armandina Gemma, MD;  Location: [facility];  Service: General;  Laterality: Left;  . THYROIDECTOMY N/A 12/09/2016   Procedure: COMPLETION THYROIDECTOMY RIGHT LOBE;  Surgeon: Armandina Gemma, MD;  Location: [facility];  Service: General;  Laterality: N/A;  . trigger thumb surgery     right  . TUBAL LIGATION    . VARICOSE VEIN SURGERY     leg   Social History   Socioeconomic History  . Marital status: Married    Spouse name: Not on file  . Number of children: 2  . Years of education: Not on file  . Highest education level: Not on file  Social Needs  . Financial resource strain: Not on file  . Food insecurity - worry: Not on file  . Food insecurity - inability: Not on file  . Transportation needs - medical: Not on file  . Transportation needs - non-medical: Not on file  Occupational History  . Occupation: Retired  Tobacco Use  . Smoking status: Former Smoker    Packs/day: 1.00    Years: 10.00    Pack years: 10.00    Types: Cigarettes    Last attempt to quit: 01/05/1991    Years since quitting: 26.0  . Smokeless tobacco: Never Used  Substance and Sexual Activity  . Alcohol use: No  . Drug use: No  . Sexual activity: Yes    Birth control/protection: Post-menopausal, Surgical   Other Topics Concern  . Not on file  Social History Narrative  . Not on file   Current Outpatient Medications on File Prior to Visit  Medication Sig Dispense Refill  . aspirin EC 81 MG tablet Take 1 tablet (81 mg total) by mouth daily. (Patient not taking: Reported on 11/29/2016) 30 tablet 3  . Biotin 2500 MCG CAPS Take 5,000 mcg by mouth every evening.    . bisacodyl (DULCOLAX) 5 MG EC tablet Take 5 mg by mouth at bedtime.    . Carboxymethylcellulose Sodium (THERATEARS OP) Place 1 drop into both eyes 2 (two) times daily as needed (dry eyes).    . cetirizine (ZYRTEC) 10 MG tablet Take 10 mg by mouth daily as needed for allergies.     . Cholecalciferol (VITAMIN D3) 5000 units CAPS Take 5,000 Units by mouth every 7 (seven) days. Monday    . HYDROcodone-acetaminophen (NORCO/VICODIN) 5-325 MG tablet Take 1-2 tablets by mouth every 4 (four) hours as needed for moderate pain. (Patient not taking: Reported on 11/29/2016) 15 tablet 0  . levothyroxine (SYNTHROID, LEVOTHROID) 112 MCG tablet Take 1 tablet (112 mcg total) by mouth daily. 45 tablet 3  . minocycline (MINOCIN,DYNACIN) 100 MG  capsule Take 100 mg by mouth See admin instructions. Takes '100mg'$  daily for 2 weeks when she has "an outbreak".    . naproxen sodium (ALEVE) 220 MG tablet Take 220-440 mg by mouth 2 (two) times daily as needed (depends on pain if takes 1-2 tablets).    Marland Kitchen omeprazole (PRILOSEC) 20 MG capsule Take 20 mg by mouth at bedtime.    . traMADol (ULTRAM) 50 MG tablet Take 1-2 tablets (50-100 mg total) by mouth every 6 (six) hours as needed for moderate pain or severe pain. 15 tablet 0   No current facility-administered medications on file prior to visit.    Allergies  Allergen Reactions  . Guaifenesin & Derivatives Nausea And Vomiting    Tremors, anxiety  . Sulfamethoxazole Other (See Comments)    Pt states it doesn't work and she doesn't want to take it    Family History  Problem Relation Age of Onset  . Diabetes Mother  27       deceased  . Heart failure Mother   . CAD Mother   . Heart attack Father 47       deceased  . Heart attack Sister 35       deceased  . Heart failure Brother 68       deceased    PE: BP 140/80   Pulse 81   Ht '5\' 5"'$  (1.651 m)   Wt 173 lb (78.5 kg)   SpO2 96%   BMI 28.79 kg/m  Wt Readings from Last 3 Encounters:  01/19/17 173 lb (78.5 kg)  12/09/16 168 lb (76.2 kg)  12/06/16 168 lb (76.2 kg)   Constitutional: overweight, in NAD Eyes: PERRLA, EOMI, no exophthalmos ENT: moist mucous membranes, thyroidectomy scar healing, no masses palpated in neck, no cervical lymphadenopathy Cardiovascular: RRR, No MRG Respiratory: CTA B Gastrointestinal: abdomen soft, NT, ND, BS+ Musculoskeletal: no deformities, strength intact in all 4 Skin: moist, warm, no rashes Neurological: no tremor with outstretched hands, DTR normal in all 4  ASSESSMENT: 1. Thyroid cancer - see HPI  2. Postsurgical hypothyroidism  3. H/o HPTH  4. Vitamin D def  PLAN:  1. Thyroid cancer -papillary, Hurthle cell variant - Patient has a history of incidental diagnosis of thyroid cancer after left lobectomy was pursued during surgery for parathyroid adenoma.  At that time, the left lobe appeared abnormal and was resected.  She had a 2 cm PTC focus which appeared to be Hurthle cell variant.  She is stage I TNM.  However, due to the fact that Hurthle cell variant of PTC is more aggressive, I recommended completion thyroidectomy and radioactive iodine treatment afterwards.  She had completion thyroidectomy by Dr. Harlow Asa 12/09/2016. - At this visit, we discussed about RAI tx (will do this with Thyrogen stimulation) - Again discussed about the main purpose for RAI treatment to allow thyroglobulin measurement in the future as a more sensitive marker for tumor progression  2. Postsurgical hypothyroidism - s/p Recent thyroidectomy on 12/09/2016, after which she was started on levothyroxine 100 mcg daily. - latest  thyroid labs reviewed with pt >> TSH was 15.3, high, on 12/21/2016.  At that time, we increased her levothyroxine dose on 12/31/2016 - she continues on LT4 112 mcg daily - pt feels good on this dose, but has fatigue, weight gain - we discussed about taking the thyroid hormone every day, with water, >30 minutes before breakfast, separated by >4 hours from acid reflux medications, calcium, iron, multivitamins. Pt. is taking it correctly. -  will check thyroid tests in 3 more weeks (1 week off Biotin): TSH and fT4 - If labs are abnormal, she will need to return for repeat TFTs in 1.5 months  3. H/o Primary HPTH - s/p resection of a parathyroid adenoma weighing 445 mg - 08/24/2016 - Calcium normal the next day, at lower limit of normal: 8.9  - however, she also had recent Completion thyroidectomy as mentioned above and her calcium the day postop was slightly low, at 8.5. - She was sent home without calcium supplementation  - reportedly calcium at the end of 12/2016 normal  4. Vit D def - At last visit, level was low - At that time,  I advised her to start 5000 units vitamin D daily - she tells me she is taking this. - We will repeat this today  Component     Latest Ref Rng & Units 01/19/2017  Calcium     8.7 - 10.3 mg/dL 9.1  PTH, Intact     15 - 65 pg/mL 21  VITD     30.00 - 100.00 ng/mL 23.22 (L)  Magnesium     1.5 - 2.5 mg/dL 2.2   Calcium, magnesium, and PTH normal. However, vitamin D is still low.  I will again discussed with her if she missed any doses.  If she did not, we will increase the dose to 5000 alternating with 10,000 units every other day.  [name], MD PhD [facility]

## 2017-01-19 NOTE — Patient Instructions (Signed)
Please stop at the lab.  You will be called for the RAI treatment.  Please continue Levothyroxine 112 mcg daily.  Take the thyroid hormone every day, with water, at least 30 minutes before breakfast, separated by at least 4 hours from: - acid reflux medications - calcium - iron - multivitamins  Come back for thyroid labs in 3-4 weeks, and make sure you stop Biotin 1 week prior to labs.  Please come back for a follow-up appointment in 4 months.

## 2017-01-20 LAB — PTH, INTACT AND CALCIUM
Calcium: 9.1 mg/dL (ref 8.7–10.3)
PTH: 21 pg/mL (ref 15–65)

## 2017-01-27 ENCOUNTER — Telehealth: Payer: Self-pay | Admitting: Internal Medicine

## 2017-01-27 NOTE — Telephone Encounter (Signed)
Patient has questions beforestarting her treatment. Patient wants to know when she needs to stop taking her thyroid medication  AND does she need to come 1 day earlier for her blood work. Please call patient and advise at ph# [phone]

## 2017-01-28 NOTE — Telephone Encounter (Signed)
She needs to take all her medicines before labs.  What do you mean I am not sure what you mean about coming one day earlier for her blood work?

## 2017-01-28 NOTE — Telephone Encounter (Signed)
Spoke to pt, told her to take all of her medications before labs. She will draw labs same day as appointment. Pt verbalized understanding.

## 2017-01-28 NOTE — Telephone Encounter (Signed)
Dr. Cruzita Lederer, please see message and advise.

## 2017-02-09 ENCOUNTER — Other Ambulatory Visit (INDEPENDENT_AMBULATORY_CARE_PROVIDER_SITE_OTHER): Payer: Medicare Other

## 2017-02-09 ENCOUNTER — Ambulatory Visit (HOSPITAL_COMMUNITY)
Admission: RE | Admit: 2017-02-09 | Discharge: 2017-02-09 | Disposition: A | Discharge status: Home / Self Care | Payer: Medicare Other | Source: Ambulatory Visit | Attending: Internal Medicine | Admitting: Internal Medicine

## 2017-02-09 DIAGNOSIS — E890 Postprocedural hypothyroidism: Secondary | ICD-10-CM

## 2017-02-09 DIAGNOSIS — E559 Vitamin D deficiency, unspecified: Secondary | ICD-10-CM

## 2017-02-09 DIAGNOSIS — C73 Malignant neoplasm of thyroid gland: Secondary | ICD-10-CM | POA: Diagnosis not present

## 2017-02-09 LAB — T4, FREE: FREE T4: 0.85 ng/dL (ref 0.60–1.60)

## 2017-02-09 LAB — VITAMIN D 25 HYDROXY (VIT D DEFICIENCY, FRACTURES): VITD: 28.28 ng/mL — AB (ref 30.00–100.00)

## 2017-02-09 LAB — TSH: TSH: 30.54 u[IU]/mL — ABNORMAL HIGH (ref 0.35–4.50)

## 2017-02-09 MED ORDER — THYROTROPIN ALFA 1.1 MG IM SOLR
0.9000 mg | INTRAMUSCULAR | Status: AC
  Administered 2017-02-09: 0.9 mg via INTRAMUSCULAR

## 2017-02-09 MED ORDER — THYROTROPIN ALFA 1.1 MG IM SOLR
INTRAMUSCULAR | Status: AC
  Filled 2017-02-09: qty 0.9

## 2017-02-10 ENCOUNTER — Telehealth: Payer: Self-pay

## 2017-02-10 ENCOUNTER — Ambulatory Visit (HOSPITAL_COMMUNITY)
Admission: RE | Admit: 2017-02-10 | Discharge: 2017-02-10 | Disposition: A | Discharge status: Home / Self Care | Payer: Medicare Other | Source: Ambulatory Visit | Attending: Internal Medicine | Admitting: Internal Medicine

## 2017-02-10 DIAGNOSIS — C73 Malignant neoplasm of thyroid gland: Secondary | ICD-10-CM | POA: Diagnosis not present

## 2017-02-10 DIAGNOSIS — E210 Primary hyperparathyroidism: Secondary | ICD-10-CM

## 2017-02-10 MED ORDER — LEVOTHYROXINE SODIUM 137 MCG PO CAPS: 137.0000 ug | ORAL_CAPSULE | Freq: Every day | ORAL | 1 refills | Status: DC

## 2017-02-10 MED ORDER — THYROTROPIN ALFA 1.1 MG IM SOLR
0.9000 mg | INTRAMUSCULAR | Status: AC
  Administered 2017-02-10: 0.9 mg via INTRAMUSCULAR

## 2017-02-10 NOTE — Telephone Encounter (Signed)
LM to have pt call back

## 2017-02-10 NOTE — Telephone Encounter (Signed)
-----   Message from [name], MD sent at 02/10/2017  9:42 AM EST ----- Let's increase the LT4 dose to 137 mcg daily and have her back for TSH and fT4 (can you please order?) in 1.5 month.

## 2017-02-11 DIAGNOSIS — E890 Postprocedural hypothyroidism: Secondary | ICD-10-CM | POA: Insufficient documentation

## 2017-02-11 DIAGNOSIS — Z9889 Other specified postprocedural states: Secondary | ICD-10-CM | POA: Diagnosis not present

## 2017-02-11 DIAGNOSIS — C73 Malignant neoplasm of thyroid gland: Secondary | ICD-10-CM | POA: Diagnosis not present

## 2017-02-11 MED ORDER — SODIUM IODIDE I 131 CAPSULE
70.8000 | Freq: Once | INTRAVENOUS | Status: AC | PRN
  Administered 2017-02-11: 70.8 via ORAL

## 2017-02-14 ENCOUNTER — Telehealth: Payer: Self-pay

## 2017-02-14 NOTE — Telephone Encounter (Signed)
Tirosint was sent apparently by mistake. Yes, she should be on LT4 tabs 137 mcg.

## 2017-02-14 NOTE — Telephone Encounter (Signed)
Pharmacy would like the OK to switch Tirosint 187mcg capsules to tablets? Please advise

## 2017-02-15 MED ORDER — LEVOTHYROXINE SODIUM 137 MCG PO CAPS: 137.0000 ug | ORAL_CAPSULE | Freq: Every day | ORAL | 1 refills | Status: DC

## 2017-02-15 NOTE — Telephone Encounter (Signed)
Sent correct RX with note to disregard [employer]

## 2017-02-16 ENCOUNTER — Other Ambulatory Visit: Payer: Self-pay

## 2017-02-16 ENCOUNTER — Telehealth: Payer: Self-pay | Admitting: Internal Medicine

## 2017-02-16 MED ORDER — LEVOTHYROXINE SODIUM 137 MCG PO TABS: 137.0000 ug | ORAL_TABLET | Freq: Every day | ORAL | 2 refills | Status: DC

## 2017-02-16 NOTE — Telephone Encounter (Signed)
Levothyroxine Sodium 137 MCG CAPS  Pt is trying to get a script picked up Pharmacy is stating that they will not fill it because they are saying that it was sent in a tablet form. Not a capsule form Pharmacy stated someone from our office needs to call to get this fixed.    Please advise    [facility] [address], [place] - [place]  [address] [phone] (Phone)

## 2017-02-16 NOTE — Telephone Encounter (Signed)
Called pharmacy. Gave VO to change levothyroxine Rx from capsule to tablet. Only have tablet in stock. Called pt and informed. Pt verbalized understanding.

## 2017-02-18 ENCOUNTER — Telehealth: Payer: Self-pay | Admitting: Internal Medicine

## 2017-02-18 NOTE — Telephone Encounter (Signed)
[facility] calling that we have Started pre Auth for patient and the CPT Code is incorrect.  They need this asap, pt comes in on Monday morning at 8:00am  The code should be [place].

## 2017-02-18 NOTE — Telephone Encounter (Signed)
PA# [id number], called [name] [name] and made verbally aware that new PA is complete for CPT code provided.

## 2017-02-21 ENCOUNTER — Ambulatory Visit (HOSPITAL_COMMUNITY)
Admission: RE | Admit: 2017-02-21 | Discharge: 2017-02-21 | Disposition: A | Discharge status: Home / Self Care | Payer: Medicare Other | Source: Ambulatory Visit | Attending: Internal Medicine | Admitting: Internal Medicine

## 2017-02-21 DIAGNOSIS — E890 Postprocedural hypothyroidism: Secondary | ICD-10-CM | POA: Insufficient documentation

## 2017-02-21 DIAGNOSIS — Z9889 Other specified postprocedural states: Secondary | ICD-10-CM

## 2017-02-21 DIAGNOSIS — Z08 Encounter for follow-up examination after completed treatment for malignant neoplasm: Secondary | ICD-10-CM | POA: Insufficient documentation

## 2017-02-21 DIAGNOSIS — C73 Malignant neoplasm of thyroid gland: Secondary | ICD-10-CM | POA: Diagnosis not present

## 2017-02-28 DIAGNOSIS — R062 Wheezing: Secondary | ICD-10-CM | POA: Diagnosis not present

## 2017-02-28 DIAGNOSIS — R05 Cough: Secondary | ICD-10-CM | POA: Diagnosis not present

## 2017-02-28 DIAGNOSIS — J40 Bronchitis, not specified as acute or chronic: Secondary | ICD-10-CM | POA: Diagnosis not present

## 2017-03-01 NOTE — Telephone Encounter (Signed)
Error

## 2017-03-01 NOTE — Telephone Encounter (Signed)
Per [employer], denial of

## 2017-05-19 ENCOUNTER — Encounter: Payer: Self-pay | Admitting: Internal Medicine

## 2017-05-19 ENCOUNTER — Ambulatory Visit: Payer: Medicare Other | Admitting: Internal Medicine

## 2017-05-19 VITALS — BP 128/72 | HR 56 | Ht 65.0 in | Wt 174.0 lb

## 2017-05-19 DIAGNOSIS — E210 Primary hyperparathyroidism: Secondary | ICD-10-CM

## 2017-05-19 DIAGNOSIS — C73 Malignant neoplasm of thyroid gland: Secondary | ICD-10-CM

## 2017-05-19 DIAGNOSIS — E890 Postprocedural hypothyroidism: Secondary | ICD-10-CM

## 2017-05-19 DIAGNOSIS — E559 Vitamin D deficiency, unspecified: Secondary | ICD-10-CM

## 2017-05-19 LAB — TSH: TSH: 0.03 u[IU]/mL — AB (ref 0.35–4.50)

## 2017-05-19 LAB — VITAMIN D 25 HYDROXY (VIT D DEFICIENCY, FRACTURES): VITD: 32.06 ng/mL (ref 30.00–100.00)

## 2017-05-19 LAB — T4, FREE: Free T4: 1.16 ng/dL (ref 0.60–1.60)

## 2017-05-19 NOTE — Progress Notes (Signed)
Patient ID: PIETRA ZULUAGA, female   DOB: [birth year], 67 y.o.   MRN: [id number]    HPI  CHERILYNN SCHOMBURG is a 67 y.o.-year-old female, initially referred by Dr. Harlow Asa Dr. Harlow Asa, now returning for management of thyroid cancer, postsurgical hypothyroidism, history of primary hyperparathyroidism (s/p parathyroidectomy).  Last visit 4 months ago.  Reviewed and addended PTC history: Pt. has been dx with thyroid cancer in 08/2016, after having had neck exploration, left superior parathyroidectomy, and, as the mass was observed in the left thyroid lobe during surgery, she also had left lobectomy. 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 This was 2 cm, positive surgical margins.  Diagnosis 1. Parathyroid gland, Left Superior - ENLARGED PARATHYROID TISSUE, 0.445 GRAMS. 2. Thyroid, lobectomy, Left - PAPILLARY THYROID CARCINOMA, HURTHLE CELL VARIANT, 2.0 CM. - CARCINOMA IS PRESENT AT NON ORIENTED TISSUE EDGE(S). - SEE ONCOLOGY TABLE BELOW Microscopic Comment 2. THYROID Specimen: Left thyroid Procedure (including lymph node sampling if applicable): Hemithyroidectomy Specimen Integrity (intact/fragmented): Focally disrupted Tumor focality: Unifocal Dominant tumor: Maximum tumor size (cm): 2.0 cm (gross measurement) Tumor laterality: Left thyroid Histologic type (including subtype and/or unique features as applicable): Papillary thyroid carcinoma, Hurthle cell variant Tumor capsule: Not identified Extrathyroidal extension: Not identified Capsular invasion with degree of invasion if present: N/A Margins: Present at non-oriented tissue edge(s). Lymphatic or vascular invasion: Not identified Lymph nodes: # examined 0; # positive; N/A Extracapsular extension (if applicable): Not identified TNM code: pT1b, pNX Non-neoplastic thyroid: Hashimoto's thyroiditis  Completion thyroidectomy on 12/09/2016: Right thyroid lobe without  malignancy.  RAI tx (02/09/2017): 70.8 mCi I131  Posttreatment whole body scan (02/21/2017): Negative for metastasis: 1. Expected activity in the thyroidectomy bed. 2. No evidence of metastatic disease.  Postsurgical hypothyroidism:  -uncontrolled  Pt is on levothyroxine 137 Mcg daily (increased after TSH returns (02/2017), taken: - daily (missed 1 dose since last visit) - in am - fasting - at least 30 min from b'fast - no Ca, Fe, MVI - + PPIs at bedtime - on Biotin - stopped 10 days ago  I reviewed pt's thyroid tests: Lab Results  Component Value Date   TSH 30.54 (H) 02/09/2017   TSH 3.50 10/27/2016   TSH 0.945 07/23/2014   FREET4 0.85 02/09/2017   FREET4 0.61 10/27/2016  12/21/2016: TSH 15.3  Pt denies: - feeling nodules in neck - dysphagia - choking - SOB with lying down  But has: - hoarseness  She has + FH of thyroid disorders in: sister. No FH of thyroid cancer. No h/o radiation tx to head or neck.  No seaweed or kelp. No recent contrast studies. No herbal supplements. No recent steroids use.    She has a history of primary hyperparathyroidism: - s/p resection of a parathyroid adenoma weighing 445 mg in 08/2016  Reviewed pertinent labs - norma PTH and calciuml : Lab Results  Component Value Date   PTH 21 01/19/2017   PTH Comment 01/19/2017   CALCIUM 9.1 01/19/2017   CALCIUM 8.5 (L) 12/10/2016   CALCIUM 9.0 12/06/2016   CALCIUM 8.9 08/25/2016   CALCIUM 11.1 (H) 07/23/2014   CALCIUM 10.4 10/09/2013   CALCIUM 9.4 01/30/2010   CALCIUM 8.9 05/10/2008   CALCIUM 8.8 04/14/2007   CALCIUM 8.7 09/07/2005   Vitamin D deficiency: Vitamin D level was still low at last visit: Lab Results  Component Value Date   VD25OH 28.28 (L) 02/09/2017   VD25OH 23.22 (L) 01/19/2017   VD25OH 20.09 (L) 10/27/2016  She is on vitamin D 5000 units once a week.  ROS: Constitutional: + weight gain/no weight loss, no fatigue, + subjective hyperthermia (+ hot flushes), no  subjective hypothermia Eyes: no blurry vision, no xerophthalmia ENT: no sore throat, + see HPI Cardiovascular: no CP/no SOB/no palpitations/no leg swelling Respiratory: no cough/no SOB/no wheezing Gastrointestinal: no N/no V/no D/no C/no acid reflux Musculoskeletal: no muscle aches/no joint aches Skin: no rashes, no hair loss Neurological: no tremors/no numbness/no tingling/no dizziness  I reviewed pt's medications, allergies, PMH, social hx, family hx, and changes were documented in the history of present illness. Otherwise, unchanged from my initial visit note.  Past Medical History:  Diagnosis Date  . Anxiety    because of parathyroid issues  . Arthritis    hands  . Asthma    x 2, triggered by allergies  . Cancer ([place])    melanoma removed from back, thyroid  . Depression    bc of parathyroid problems  . GERD (gastroesophageal reflux disease)   . History of palpitations    none since 2016  . Pre-diabetes   . Seasonal allergies   . Varicose vein    Past Surgical History:  Procedure Laterality Date  . CARPAL TUNNEL RELEASE Right 02/05/2014   Procedure: RIGHT CARPAL TUNNEL RELEASE;  Surgeon: Jessy Oto, MD;  Location: [facility];  Service: Orthopedics;  Laterality: Right;  . COLONOSCOPY    . EYE SURGERY     Bilateral Lasik  . LAPAROSCOPIC ASSISTED VAGINAL HYSTERECTOMY Bilateral 10/11/2013   Procedure: LAPAROSCOPIC ASSISTED VAGINAL HYSTERECTOMY BILATERAL SALPINGO OOPHORECTOMY;  Surgeon: Allena Katz, MD;  Location: [facility];  Service: Gynecology;  Laterality: Bilateral;  . Malignant melanoma resected     lower back area  . PARATHYROIDECTOMY Left 08/24/2016   Procedure: NECK EXPLORATION LEFT SUPERIOR PARATHYROIDECTOMY LEFT LOBE THYROIDECTOMY;  Surgeon: Armandina Gemma, MD;  Location: [facility];  Service: General;  Laterality: Left;  . THYROIDECTOMY N/A 12/09/2016   Procedure: COMPLETION THYROIDECTOMY RIGHT LOBE;  Surgeon: Armandina Gemma, MD;  Location: [facility];   Service: General;  Laterality: N/A;  . trigger thumb surgery     right  . TUBAL LIGATION    . VARICOSE VEIN SURGERY     leg   Social History   Socioeconomic History  . Marital status: Married    Spouse name: Not on file  . Number of children: 2  . Years of education: Not on file  . Highest education level: Not on file  Occupational History  . Occupation: Retired  Scientific laboratory technician  . Financial resource strain: Not on file  . Food insecurity:    Worry: Not on file    Inability: Not on file  . Transportation needs:    Medical: Not on file    Non-medical: Not on file  Tobacco Use  . Smoking status: Former Smoker    Packs/day: 1.00    Years: 10.00    Pack years: 10.00    Types: Cigarettes    Last attempt to quit: 01/05/1991    Years since quitting: 26.3  . Smokeless tobacco: Never Used  Substance and Sexual Activity  . Alcohol use: No  . Drug use: No  . Sexual activity: Yes    Birth control/protection: Post-menopausal, Surgical  Lifestyle  . Physical activity:    Days per week: Not on file    Minutes per session: Not on file  . Stress: Not on file  Relationships  . Social connections:  Talks on phone: Not on file    Gets together: Not on file    Attends religious service: Not on file    Active member of club or organization: Not on file    Attends meetings of clubs or organizations: Not on file    Relationship status: Not on file  . Intimate partner violence:    Fear of current or ex partner: Not on file    Emotionally abused: Not on file    Physically abused: Not on file    Forced sexual activity: Not on file  Other Topics Concern  . Not on file  Social History Narrative  . Not on file   Current Outpatient Medications on File Prior to Visit  Medication Sig Dispense Refill  . aspirin EC 81 MG tablet Take 1 tablet (81 mg total) by mouth daily. (Patient not taking: Reported on 01/19/2017) 30 tablet 3  . Biotin 2500 MCG CAPS Take 5,000 mcg by mouth every evening.     . bisacodyl (DULCOLAX) 5 MG EC tablet Take 5 mg by mouth at bedtime.    . Carboxymethylcellulose Sodium (THERATEARS OP) Place 1 drop into both eyes 2 (two) times daily as needed (dry eyes).    . cetirizine (ZYRTEC) 10 MG tablet Take 10 mg by mouth daily as needed for allergies.     . Cholecalciferol (VITAMIN D3) 5000 units CAPS Take 5,000 Units by mouth every 7 (seven) days. Monday    . HYDROcodone-acetaminophen (NORCO/VICODIN) 5-325 MG tablet Take 1-2 tablets by mouth every 4 (four) hours as needed for moderate pain. 15 tablet 0  . levothyroxine (SYNTHROID, LEVOTHROID) 137 MCG tablet Take 1 tablet (137 mcg total) by mouth daily before breakfast. 30 tablet 2  . minocycline (MINOCIN,DYNACIN) 100 MG capsule Take 100 mg by mouth See admin instructions. Takes '100mg'$  daily for 2 weeks when she has "an outbreak".    . naproxen sodium (ALEVE) 220 MG tablet Take 220-440 mg by mouth 2 (two) times daily as needed (depends on pain if takes 1-2 tablets).    Marland Kitchen omeprazole (PRILOSEC) 20 MG capsule Take 20 mg by mouth at bedtime.    . traMADol (ULTRAM) 50 MG tablet Take 1-2 tablets (50-100 mg total) by mouth every 6 (six) hours as needed for moderate pain or severe pain. 15 tablet 0   No current facility-administered medications on file prior to visit.    Allergies  Allergen Reactions  . Guaifenesin & Derivatives Nausea And Vomiting    Tremors, anxiety  . Sulfamethoxazole Other (See Comments)    Pt states it doesn't work and she doesn't want to take it    Family History  Problem Relation Age of Onset  . Diabetes Mother 63       deceased  . Heart failure Mother   . CAD Mother   . Heart attack Father 69       deceased  . Heart attack Sister 53       deceased  . Heart failure Brother 45       deceased    PE: BP 128/72 (BP Location: Left Arm, Patient Position: Sitting, Cuff Size: Normal)   Pulse (!) 56   Ht '5\' 5"'$  (1.651 m)   Wt 174 lb (78.9 kg)   SpO2 98%   BMI 28.96 kg/m  Wt Readings from  Last 3 Encounters:  05/19/17 174 lb (78.9 kg)  01/19/17 173 lb (78.5 kg)  12/09/16 168 lb (76.2 kg)   Constitutional: overweight, in NAD Eyes:  PERRLA, EOMI, no exophthalmos ENT: moist mucous membranes, thyroidectomy scar healed, no masses palpated in the neck, no cervical lymphadenopathy Cardiovascular: RRR, No MRG Respiratory: CTA B Gastrointestinal: abdomen soft, NT, ND, BS+ Musculoskeletal: no deformities, strength intact in all 4 Skin: moist, warm, no rashes Neurological: no tremor with outstretched hands, DTR normal in all 4   ASSESSMENT: 1. Thyroid cancer - see HPI  2. Postsurgical hypothyroidism  3. H/o HPTH  4. Vitamin D def  PLAN:  1. Thyroid cancer -papillary, Hurthle cell variant - Patient has a history of incidental diagnosis of thyroid cancer after left lobectomy pursued during surgery for parathyroid adenoma.  At that time, the left lobe appeared abnormal and was resected.  She had a 2 cm PTC focus which appeared to be Hurthle cell variant.  She is stage I TNM.  However, due to the fact that her the cell variant of PTC is more aggressive, I recommended completion thyroidectomy and RAI treatment afterwards.  She had completion thyroidectomy by Dr. Harlow Asa on 12/09/2017 and then RAI treatment with 70.8 mCi I-131 on 02/09/2017. - At today's visit,we will check her thyroglobulin and ATA  2. Postsurgical hypothyroidism - Her total thyroidectomy in 12/2016 12/09/2016, after which she started on levothyroxine - latest thyroid labs reviewed with pt >> very high in 02/2017, I suspect incomplete compliance, however, at that time, we increased her levothyroxine dose to 137 mcg - she continues on LT4 137 mcg daily - pt feels good on this dose. - we discussed about taking the thyroid hormone every day, with water, >30 minutes before breakfast, separated by >4 hours from acid reflux medications, calcium, iron, multivitamins. Pt. is taking it correctly. - will check thyroid tests  today: TSH and fT4 - If labs are abnormal, she will need to return for repeat TFTs in 1.5 months  3. H/o Primary HPTH - s/p resection of a parathyroid adenoma weighing 445 mg in 08/2016 - Calcium normal after the surgery - Her calcium was low after her re-intervention for completion thyroidectomy, but this recovered and in 01/2017 it normalized to 9.1  4. Vit D def - At last visit, this was still low - on 5000 units vitamin D supplement once a week - Repeat vitamin D level today  Component     Latest Ref Rng & Units 05/19/2017  Thyroglobulin     ng/mL <0.1 (L)  TSH     0.35 - 4.50 uIU/mL 0.03 (L)  T4,Free(Direct)     0.60 - 1.60 ng/dL 1.16  VITD     30.00 - 100.00 ng/mL 32.06  Thyroglobulin Ab     < or = 1 IU/mL 2 (H)   Will decrease her LT4 dose from 137 to 125 as her TSH is suppressed. Will have her back for repeat labs in 1.5 mo.  ATA high >> will recheck Tg + ATA with [facility] assay at next visit. Vitamin D low nl >> continue current Vitamin D supplement.  Needs refills of LT4.   [name], MD PhD [facility] Endocrinology

## 2017-05-19 NOTE — Patient Instructions (Signed)
Please continue 137 mcg daily of Levothyroxine.  Take the thyroid hormone every day, with water, at least 30 minutes before breakfast, separated by at least 4 hours from: - acid reflux medications - calcium - iron - multivitamins  For now, continue Vitamin D 5000 units weekly.  Please come back for a follow-up appointment in 6 months

## 2017-05-20 LAB — THYROGLOBULIN LEVEL: Thyroglobulin: <0.1 ng/mL — ABNORMAL LOW

## 2017-05-20 LAB — THYROGLOBULIN ANTIBODY: Thyroglobulin Ab: 2 IU/mL — ABNORMAL HIGH (ref <=1)

## 2017-05-20 MED ORDER — LEVOTHYROXINE SODIUM 125 MCG PO TABS: 137.0000 ug | ORAL_TABLET | Freq: Every day | ORAL | 5 refills | Status: DC

## 2017-05-31 ENCOUNTER — Telehealth: Payer: Self-pay | Admitting: Internal Medicine

## 2017-05-31 ENCOUNTER — Other Ambulatory Visit: Payer: Self-pay

## 2017-05-31 MED ORDER — LEVOTHYROXINE SODIUM 125 MCG PO TABS: 137.0000 ug | ORAL_TABLET | Freq: Every day | ORAL | 5 refills | Status: DC

## 2017-05-31 NOTE — Telephone Encounter (Signed)
Patient stated the pharmacy have not received her medication [place], [place] [address] [place]

## 2017-05-31 NOTE — Telephone Encounter (Signed)
This was originally ordered on 05/20/17 but I e-scribed it again today

## 2017-06-20 ENCOUNTER — Telehealth: Payer: Self-pay | Admitting: Internal Medicine

## 2017-06-20 MED ORDER — LEVOTHYROXINE SODIUM 125 MCG PO TABS: 137.0000 ug | ORAL_TABLET | Freq: Every day | ORAL | 5 refills | Status: DC

## 2017-06-20 NOTE — Telephone Encounter (Signed)
Re-sent to pharmacy.

## 2017-06-20 NOTE — Telephone Encounter (Signed)
levothyroxine (SYNTHROID, LEVOTHROID) 125 MCG tablet  Patient stated that the dosage of this medication was not changed.   It is showing 125 which she states is the new dosage but that the pharmacy has not received this prescription for the new dosage.     [place], [place] [address]

## 2017-06-22 DIAGNOSIS — K219 Gastro-esophageal reflux disease without esophagitis: Secondary | ICD-10-CM | POA: Diagnosis not present

## 2017-06-22 DIAGNOSIS — C73 Malignant neoplasm of thyroid gland: Secondary | ICD-10-CM | POA: Diagnosis not present

## 2017-06-22 DIAGNOSIS — Z9189 Other specified personal risk factors, not elsewhere classified: Secondary | ICD-10-CM | POA: Diagnosis not present

## 2017-06-22 DIAGNOSIS — E039 Hypothyroidism, unspecified: Secondary | ICD-10-CM | POA: Diagnosis not present

## 2017-06-22 DIAGNOSIS — R7301 Impaired fasting glucose: Secondary | ICD-10-CM | POA: Diagnosis not present

## 2017-06-30 DIAGNOSIS — R7301 Impaired fasting glucose: Secondary | ICD-10-CM | POA: Diagnosis not present

## 2017-06-30 DIAGNOSIS — K219 Gastro-esophageal reflux disease without esophagitis: Secondary | ICD-10-CM | POA: Diagnosis not present

## 2017-06-30 DIAGNOSIS — Z1389 Encounter for screening for other disorder: Secondary | ICD-10-CM | POA: Diagnosis not present

## 2017-06-30 DIAGNOSIS — L718 Other rosacea: Secondary | ICD-10-CM | POA: Diagnosis not present

## 2017-06-30 DIAGNOSIS — E039 Hypothyroidism, unspecified: Secondary | ICD-10-CM | POA: Diagnosis not present

## 2017-06-30 DIAGNOSIS — Z23 Encounter for immunization: Secondary | ICD-10-CM | POA: Diagnosis not present

## 2017-07-04 ENCOUNTER — Other Ambulatory Visit: Payer: Medicare Other

## 2017-07-15 ENCOUNTER — Telehealth: Payer: Self-pay

## 2017-07-15 NOTE — Telephone Encounter (Signed)
Left message for patient to call and reschedule lab appt.

## 2017-07-20 DIAGNOSIS — Z1231 Encounter for screening mammogram for malignant neoplasm of breast: Secondary | ICD-10-CM | POA: Diagnosis not present

## 2017-08-10 ENCOUNTER — Other Ambulatory Visit: Payer: Medicare Other

## 2017-08-11 ENCOUNTER — Other Ambulatory Visit: Payer: Medicare Other

## 2017-08-12 ENCOUNTER — Other Ambulatory Visit: Payer: Medicare Other

## 2017-08-12 DIAGNOSIS — E890 Postprocedural hypothyroidism: Secondary | ICD-10-CM | POA: Diagnosis not present

## 2017-08-12 DIAGNOSIS — C73 Malignant neoplasm of thyroid gland: Secondary | ICD-10-CM | POA: Diagnosis not present

## 2017-08-15 ENCOUNTER — Other Ambulatory Visit (INDEPENDENT_AMBULATORY_CARE_PROVIDER_SITE_OTHER): Payer: Medicare Other

## 2017-08-15 DIAGNOSIS — E890 Postprocedural hypothyroidism: Secondary | ICD-10-CM

## 2017-08-15 LAB — TSH: TSH: 0.1 u[IU]/mL — AB (ref 0.35–4.50)

## 2017-08-15 LAB — T4, FREE: Free T4: 1.3 ng/dL (ref 0.60–1.60)

## 2017-08-22 ENCOUNTER — Telehealth: Payer: Self-pay | Admitting: Internal Medicine

## 2017-08-22 NOTE — Telephone Encounter (Signed)
Patient would like a call back to discuss lab results and a medication change   Platient said it is okay to leave a message    levothyroxine (SYNTHROID, LEVOTHROID) 125 MCG tablet

## 2017-08-22 NOTE — Telephone Encounter (Signed)
Went over labs with patient and she does not need a refill at this time

## 2017-10-27 DIAGNOSIS — Z23 Encounter for immunization: Secondary | ICD-10-CM | POA: Diagnosis not present

## 2017-11-18 ENCOUNTER — Encounter: Payer: Self-pay | Admitting: Internal Medicine

## 2017-11-18 ENCOUNTER — Ambulatory Visit: Payer: Medicare Other | Admitting: Internal Medicine

## 2017-11-18 VITALS — BP 130/82 | HR 66 | Ht 65.0 in | Wt 176.0 lb

## 2017-11-18 DIAGNOSIS — E890 Postprocedural hypothyroidism: Secondary | ICD-10-CM

## 2017-11-18 DIAGNOSIS — E210 Primary hyperparathyroidism: Secondary | ICD-10-CM

## 2017-11-18 DIAGNOSIS — C73 Malignant neoplasm of thyroid gland: Secondary | ICD-10-CM

## 2017-11-18 DIAGNOSIS — E559 Vitamin D deficiency, unspecified: Secondary | ICD-10-CM

## 2017-11-18 LAB — T4, FREE: Free T4: 1.19 ng/dL (ref 0.60–1.60)

## 2017-11-18 LAB — CALCIUM: Calcium: 8.9 mg/dL (ref 8.4–10.5)

## 2017-11-18 LAB — TSH: TSH: 0.07 u[IU]/mL — AB (ref 0.35–4.50)

## 2017-11-18 LAB — VITAMIN D 25 HYDROXY (VIT D DEFICIENCY, FRACTURES): VITD: 28.3 ng/mL — ABNORMAL LOW (ref 30.00–100.00)

## 2017-11-18 MED ORDER — LEVOTHYROXINE SODIUM 112 MCG PO TABS: 112.0000 ug | ORAL_TABLET | Freq: Every day | ORAL | 3 refills | Status: DC

## 2017-11-18 NOTE — Progress Notes (Addendum)
Patient ID: [name] [name], female   DOB: [birth year], 67 y.o.   MRN: [id number]    HPI  [name] [name] is a 67 y.o.-year-old female, initially referred by Dr. Harlow Asa, now returning for management of thyroid cancer, postsurgical hypothyroidism, history of primary hyperparathyroidism (s/p parathyroidectomy) and history of vitamin D deficiency.  Last visit 6 months ago.  Reviewed and addended PTC history: Pt. has been dx with thyroid cancer in 08/2016, after having had neck exploration, left superior parathyroidectomy, and, as the mass was observed in the left thyroid lobe during surgery, she also had left lobectomy. 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 This was 2 cm, positive surgical margins.  Diagnosis 1. Parathyroid gland, Left Superior - ENLARGED PARATHYROID TISSUE, 0.445 GRAMS. 2. Thyroid, lobectomy, Left - PAPILLARY THYROID CARCINOMA, HURTHLE CELL VARIANT, 2.0 CM. - CARCINOMA IS PRESENT AT NON ORIENTED TISSUE EDGE(S). - SEE ONCOLOGY TABLE BELOW Microscopic Comment 2. THYROID Specimen: Left thyroid Procedure (including lymph node sampling if applicable): Hemithyroidectomy Specimen Integrity (intact/fragmented): Focally disrupted Tumor focality: Unifocal Dominant tumor: Maximum tumor size (cm): 2.0 cm (gross measurement) Tumor laterality: Left thyroid Histologic type (including subtype and/or unique features as applicable): Papillary thyroid carcinoma, Hurthle cell variant Tumor capsule: Not identified Extrathyroidal extension: Not identified Capsular invasion with degree of invasion if present: N/A Margins: Present at non-oriented tissue edge(s). Lymphatic or vascular invasion: Not identified Lymph nodes: # examined 0; # positive; N/A Extracapsular extension (if applicable): Not identified TNM code: pT1b, pNX Non-neoplastic thyroid: Hashimoto's thyroiditis  Completion thyroidectomy on 12/09/2016: Right thyroid  lobe without malignancy.  RAI tx (02/09/2017): 70.8 mCi I131  Posttreatment whole body scan (02/21/2017): Negative for metastasis: 1. Expected activity in the thyroidectomy bed. 2. No evidence of metastatic disease.   Patient's thyroglobulin was undetectable at last visit, however, her thyroglobulin antibodies were elevated: Lab Results  Component Value Date   THYROGLB <0.1 (L) 05/19/2017   THGAB 2 (H) 05/19/2017   Postsurgical hypothyroidism: -Uncontrolled  Pt is on levothyroxine 125 Mcg daily (decreased at last visit from 137 mcg), taken: - in am - fasting - at least 30 min from b'fast - no Ca, Fe, MVI - + PPIs at bedtime - on Biotin - stopped 2 weeks ago  Reviewed patient's TFTs: Lab Results  Component Value Date   TSH 0.10 (L) 08/15/2017   TSH 0.03 (L) 05/19/2017   TSH 30.54 (H) 02/09/2017   TSH 3.50 10/27/2016   TSH 0.945 07/23/2014   FREET4 1.30 08/15/2017   FREET4 1.16 05/19/2017   FREET4 0.85 02/09/2017   FREET4 0.61 10/27/2016  12/21/2016: TSH 15.3  Pt denies: - feeling nodules in neck - dysphagia - choking - SOB with lying down  Still has - hoarseness  She has + FH of thyroid disorders in: sister. No FH of thyroid cancer. No h/o radiation tx to head or neck other than RAI tx.  No herbal supplements. No Biotin use. No recent steroids use.   History of primary hyperparathyroidism: - s/p resection of a parathyroid adenoma weighing 445 mg in 08/2016  Reviewed pertinent labs-normal PTH and calcium: Lab Results  Component Value Date   PTH 21 01/19/2017   PTH Comment 01/19/2017   CALCIUM 9.1 01/19/2017   CALCIUM 8.5 (L) 12/10/2016   CALCIUM 9.0 12/06/2016   CALCIUM 8.9 08/25/2016   CALCIUM 11.1 (H) 07/23/2014   CALCIUM 10.4 10/09/2013   CALCIUM 9.4 01/30/2010   CALCIUM 8.9 05/10/2008   CALCIUM 8.8 04/14/2007  CALCIUM 8.7 09/07/2005   Vitamin D deficiency: Latest vitamin D level was finally normal: Lab Results  Component Value Date   VD25OH  32.06 05/19/2017   VD25OH 28.28 (L) 02/09/2017   VD25OH 23.22 (L) 01/19/2017   VD25OH 20.09 (L) 10/27/2016   She continues on vitamin D 5000 units once a week  ROS: Constitutional: + weight gain/no weight loss, no fatigue, + subjective hyperthermia, no subjective hypothermia Eyes: no blurry vision, no xerophthalmia ENT: no sore throat, + see HPI Cardiovascular: no CP/no SOB/no palpitations/no leg swelling Respiratory: no cough/no SOB/no wheezing Gastrointestinal: no N/no V/no D/+ C/no acid reflux Musculoskeletal: no muscle aches/no joint aches Skin: no rashes, no hair loss Neurological: no tremors/no numbness/no tingling/no dizziness, + HA  I reviewed pt's medications, allergies, PMH, social hx, family hx, and changes were documented in the history of present illness. Otherwise, unchanged from my initial visit note.  Past Medical History:  Diagnosis Date  . Anxiety    because of parathyroid issues  . Arthritis    hands  . Asthma    x 2, triggered by allergies  . Cancer ([place])    melanoma removed from back, thyroid  . Depression    bc of parathyroid problems  . GERD (gastroesophageal reflux disease)   . History of palpitations    none since 2016  . Pre-diabetes   . Seasonal allergies   . Varicose vein    Past Surgical History:  Procedure Laterality Date  . CARPAL TUNNEL RELEASE Right 02/05/2014   Procedure: RIGHT CARPAL TUNNEL RELEASE;  Surgeon: Jessy Oto, MD;  Location: [facility];  Service: Orthopedics;  Laterality: Right;  . COLONOSCOPY    . EYE SURGERY     Bilateral Lasik  . LAPAROSCOPIC ASSISTED VAGINAL HYSTERECTOMY Bilateral 10/11/2013   Procedure: LAPAROSCOPIC ASSISTED VAGINAL HYSTERECTOMY BILATERAL SALPINGO OOPHORECTOMY;  Surgeon: Allena Katz, MD;  Location: [facility];  Service: Gynecology;  Laterality: Bilateral;  . Malignant melanoma resected     lower back area  . PARATHYROIDECTOMY Left 08/24/2016   Procedure: NECK EXPLORATION LEFT  SUPERIOR PARATHYROIDECTOMY LEFT LOBE THYROIDECTOMY;  Surgeon: Armandina Gemma, MD;  Location: [facility];  Service: General;  Laterality: Left;  . THYROIDECTOMY N/A 12/09/2016   Procedure: COMPLETION THYROIDECTOMY RIGHT LOBE;  Surgeon: Armandina Gemma, MD;  Location: [facility];  Service: General;  Laterality: N/A;  . trigger thumb surgery     right  . TUBAL LIGATION    . VARICOSE VEIN SURGERY     leg   Social History   Socioeconomic History  . Marital status: Married    Spouse name: Not on file  . Number of children: 2  . Years of education: Not on file  . Highest education level: Not on file  Occupational History  . Occupation: Retired  Scientific laboratory technician  . Financial resource strain: Not on file  . Food insecurity:    Worry: Not on file    Inability: Not on file  . Transportation needs:    Medical: Not on file    Non-medical: Not on file  Tobacco Use  . Smoking status: Former Smoker    Packs/day: 1.00    Years: 10.00    Pack years: 10.00    Types: Cigarettes    Last attempt to quit: 01/05/1991    Years since quitting: 26.8  . Smokeless tobacco: Never Used  Substance and Sexual Activity  . Alcohol use: No  . Drug use: No  . Sexual activity: Yes  Birth control/protection: Post-menopausal, Surgical  Lifestyle  . Physical activity:    Days per week: Not on file    Minutes per session: Not on file  . Stress: Not on file  Relationships  . Social connections:    Talks on phone: Not on file    Gets together: Not on file    Attends religious service: Not on file    Active member of club or organization: Not on file    Attends meetings of clubs or organizations: Not on file    Relationship status: Not on file  . Intimate partner violence:    Fear of current or ex partner: Not on file    Emotionally abused: Not on file    Physically abused: Not on file    Forced sexual activity: Not on file  Other Topics Concern  . Not on file  Social History Narrative  . Not on file   Current  Outpatient Medications on File Prior to Visit  Medication Sig Dispense Refill  . aspirin EC 81 MG tablet Take 1 tablet (81 mg total) by mouth daily. 30 tablet 3  . Biotin 2500 MCG CAPS Take 5,000 mcg by mouth every evening.    . bisacodyl (DULCOLAX) 5 MG EC tablet Take 5 mg by mouth at bedtime.    . Carboxymethylcellulose Sodium (THERATEARS OP) Place 1 drop into both eyes 2 (two) times daily as needed (dry eyes).    . cetirizine (ZYRTEC) 10 MG tablet Take 10 mg by mouth daily as needed for allergies.     . Cholecalciferol (VITAMIN D3) 5000 units CAPS Take 5,000 Units by mouth every 7 (seven) days. Monday    . HYDROcodone-acetaminophen (NORCO/VICODIN) 5-325 MG tablet Take 1-2 tablets by mouth every 4 (four) hours as needed for moderate pain. 15 tablet 0  . levothyroxine (SYNTHROID, LEVOTHROID) 125 MCG tablet Take 1 tablet (125 mcg total) by mouth daily before breakfast. 30 tablet 5  . minocycline (MINOCIN,DYNACIN) 100 MG capsule Take 100 mg by mouth See admin instructions. Takes 166m daily for 2 weeks when she has "an outbreak".    . naproxen sodium (ALEVE) 220 MG tablet Take 220-440 mg by mouth 2 (two) times daily as needed (depends on pain if takes 1-2 tablets).    .Marland Kitchenomeprazole (PRILOSEC) 20 MG capsule Take 20 mg by mouth at bedtime.    . traMADol (ULTRAM) 50 MG tablet Take 1-2 tablets (50-100 mg total) by mouth every 6 (six) hours as needed for moderate pain or severe pain. 15 tablet 0   No current facility-administered medications on file prior to visit.    Allergies  Allergen Reactions  . Guaifenesin & Derivatives Nausea And Vomiting    Tremors, anxiety  . Penicillins     Pt states it does not work  . Sulfamethoxazole Other (See Comments)    Pt states it doesn't work and she doesn't want to take it    Family History  Problem Relation Age of Onset  . Diabetes Mother 65      deceased  . Heart failure Mother   . CAD Mother   . Heart attack Father 574      deceased  . Heart attack  Sister 453      deceased  . Heart failure Brother 68       deceased    PE: BP 130/82   Pulse 66   Ht _0  (1.651 m) Comment: measured  Wt 176 lb (79.8 kg)  SpO2 96%   BMI 29.29 kg/m  Wt Readings from Last 3 Encounters:  11/18/17 176 lb (79.8 kg)  05/19/17 174 lb (78.9 kg)  01/19/17 173 lb (78.5 kg)   Constitutional: overweight, in NAD Eyes: PERRLA, EOMI, no exophthalmos ENT: moist mucous membranes, thyroidectomy scar healed, no cervical lymphadenopathy Cardiovascular: RRR, No MRG Respiratory: CTA B Gastrointestinal: abdomen soft, NT, ND, BS+ Musculoskeletal: no deformities, strength intact in all 4 Skin: moist, warm, no rashes Neurological: no tremor with outstretched hands, DTR normal in all 4  ASSESSMENT: 1. Thyroid cancer - see HPI  2. Postsurgical hypothyroidism  3. H/o HPTH  4. Vitamin D def  PLAN:  1. Thyroid cancer -papillary, Hurthle cell variant - Patient has a history of incidental diagnosis of thyroid cancer after left lobectomy pursued during surgery for parathyroid adenoma.  At that time, the left lobe appeared abnormal and was resected.  She had a 2 cm PTC focus which appeared to be a Hurthle cell varian.  She is stage I TNM.  However, due to the fact that her the cell variant of PTC is more aggressive, I recommended completion thyroidectomy and RAI treatment afterwards.  She had completion thyroidectomy by Dr. Harlow Asa on 12/09/2017 and then RAI treatment with 70.8 mCi I-131 on 02/09/2017. -At today's visit, we will recheck her thyroglobulin and ATA, however, her antibodies were positive at last check so we will need to check this with a [facility] assay  2. Postsurgical hypothyroidism -After her total thyroidectomy in 12/2016 - latest thyroid labs reviewed with pt >> normal  - she continues on LT4 125 (decreased from 137 mcg at last visit) - pt feels good on this dose. - we discussed about taking the thyroid hormone every day, with water, >30 minutes before  breakfast, separated by >4 hours from acid reflux medications, calcium, iron, multivitamins. Pt. is taking it correctly. - will check thyroid tests today: TSH and fT4 - If labs are abnormal, she will need to return for repeat TFTs in 1.5 months  3. H/o Primary HPTH - she is s/p resection of a parathyroid adenoma weighing 445 mg in 08/2016 - Calcium normalized after the surgery  - Latest calcium level was normal 01/2017  4. Vit D def - continues on vitamin D 5000 units weekly - Latest level normalized in 05/2017 -We will repeat her vitamin D level today  Needs refills.  Component     Latest Ref Rng & Units 11/18/2017  Calcium     8.4 - 10.5 mg/dL 8.9  TSH     0.35 - 4.50 uIU/mL 0.07 (L)  T4,Free(Direct)     0.60 - 1.60 ng/dL 1.19  VITD     30.00 - 100.00 ng/mL 28.30 (L)   TSH suppressed.  Will need to decrease her levothyroxine further to 112 mcg daily.  We will call this into her pharmacy I will advised her to come back for labs in 1.5 months. Vitamin D is also slightly low will increase her vitamin D dose to 5000 units twice a week.  We will add a vitamin D level to the labs obtained in 1.5 months.  Thyroglobulin + ATA antibodies undetectable: Component     Latest Ref Rng & Units 11/18/2017  Thyroglobulin Antibody     0.0 - 0.9 IU/mL <1.0  Thyroglobulin by IMA     1.5 - 38.5 ng/mL <0.1 (L)   US THYROID CLINICAL DATA:  Other. 67 year old female with a history of thyroid cancer status post total thyroidectomy and  radioactive iodine ablation therapy.  EXAM: THYROID ULTRASOUND  TECHNIQUE: Ultrasound examination of the thyroid gland and adjacent soft tissues was performed.  COMPARISON:  Most recent prior thyroid ultrasound 11/01/2016  FINDINGS: The thyroid gland is surgically absent. No evidence of residual or recurrent thyroid tissue on either the right or left. No evidence of suspicious or abnormal lymphadenopathy along either cervical  chain.  IMPRESSION: Surgical changes of complete thyroidectomy without evidence of residual or recurrent thyroid tissue or nodularity.  The above is in keeping with the ACR TI-RADS recommendations - J Am Coll Radiol 2017;14:587-595.  Electronically Signed   By: Jacqulynn Cadet M.D.   On: 11/22/2017 16:22    [name], MD PhD [facility] Endocrinology

## 2017-11-18 NOTE — Patient Instructions (Signed)
Please continue 125 mcg daily of Levothyroxine.  Take the thyroid hormone every day, with water, at least 30 minutes before breakfast, separated by at least 4 hours from: - acid reflux medications - calcium - iron - multivitamins  For now, continue Vitamin D 5000 units weekly.  Please come back for a follow-up appointment in 6 months.

## 2017-11-21 ENCOUNTER — Telehealth: Payer: Self-pay | Admitting: Internal Medicine

## 2017-11-21 LAB — THYROGLOBULIN BY IMA

## 2017-11-21 LAB — TGAB+THYROGLOBULIN IMA OR LCMS

## 2017-11-21 NOTE — Telephone Encounter (Signed)
-----   Message from [name], MD sent at 11/18/2017  6:05 PM EST ----- [name], can you please call pt: Patient's thyroid tests are still abnormal so we need to decrease her levothyroxine dose even further, to 112 mcg daily.  I called this to her pharmacy.  She will need to come for labs again in 1.5 months.  Labs are in. Also, her vitamin D slightly low, so I would advise her to increase her vitamin D dose to 5000 units twice a week.  We will recheck this when she comes back for the thyroid test. Her thyroid cancer marker (thyroglobulin) is not back yet.

## 2017-11-21 NOTE — Telephone Encounter (Signed)
Please call patient at ph# [phone] re: test results

## 2017-11-21 NOTE — Telephone Encounter (Signed)
LVM requesting patient call back for lab results

## 2017-11-22 ENCOUNTER — Ambulatory Visit
Admission: RE | Admit: 2017-11-22 | Discharge: 2017-11-22 | Disposition: A | Discharge status: Home / Self Care | Payer: Medicare Other | Source: Ambulatory Visit | Attending: Internal Medicine | Admitting: Internal Medicine

## 2017-11-22 DIAGNOSIS — C73 Malignant neoplasm of thyroid gland: Secondary | ICD-10-CM

## 2017-11-22 DIAGNOSIS — Z85850 Personal history of malignant neoplasm of thyroid: Secondary | ICD-10-CM | POA: Diagnosis not present

## 2017-11-23 NOTE — Telephone Encounter (Signed)
Gave patient results and she verbalized an understanding-scheduled lab appointment for 01/06/18

## 2017-12-12 ENCOUNTER — Telehealth: Payer: Self-pay | Admitting: Internal Medicine

## 2017-12-12 NOTE — Telephone Encounter (Signed)
Patient has called inquiring about her scan results. States she had the scan 3 weeks ago. Please Advise, thanks

## 2017-12-12 NOTE — Telephone Encounter (Signed)
Please look under radiology:  Notes recorded by Nile Riggs, CMA on 11/23/2017 at 8:36 AM EST Patient informed of this result and she verbalized an understanding ------  Notes recorded by [name], MD on 11/22/2017 at 5:17 PM EST Melissa, can you please call pt: Good news: Neck ultrasound does not show any abnormal masses or metastasis of her previous thyroid cancer.

## 2017-12-23 DIAGNOSIS — E039 Hypothyroidism, unspecified: Secondary | ICD-10-CM | POA: Diagnosis not present

## 2017-12-23 DIAGNOSIS — K219 Gastro-esophageal reflux disease without esophagitis: Secondary | ICD-10-CM | POA: Diagnosis not present

## 2017-12-23 DIAGNOSIS — R7301 Impaired fasting glucose: Secondary | ICD-10-CM | POA: Diagnosis not present

## 2017-12-29 DIAGNOSIS — Z1212 Encounter for screening for malignant neoplasm of rectum: Secondary | ICD-10-CM | POA: Diagnosis not present

## 2017-12-29 DIAGNOSIS — R7301 Impaired fasting glucose: Secondary | ICD-10-CM | POA: Diagnosis not present

## 2017-12-29 DIAGNOSIS — E039 Hypothyroidism, unspecified: Secondary | ICD-10-CM | POA: Diagnosis not present

## 2017-12-29 DIAGNOSIS — K219 Gastro-esophageal reflux disease without esophagitis: Secondary | ICD-10-CM | POA: Diagnosis not present

## 2017-12-29 DIAGNOSIS — Z0001 Encounter for general adult medical examination with abnormal findings: Secondary | ICD-10-CM | POA: Diagnosis not present

## 2017-12-29 DIAGNOSIS — L718 Other rosacea: Secondary | ICD-10-CM | POA: Diagnosis not present

## 2018-01-06 ENCOUNTER — Other Ambulatory Visit: Payer: Medicare Other

## 2018-01-11 ENCOUNTER — Other Ambulatory Visit (INDEPENDENT_AMBULATORY_CARE_PROVIDER_SITE_OTHER): Payer: Medicare HMO

## 2018-01-11 DIAGNOSIS — E559 Vitamin D deficiency, unspecified: Secondary | ICD-10-CM | POA: Diagnosis not present

## 2018-01-11 DIAGNOSIS — E890 Postprocedural hypothyroidism: Secondary | ICD-10-CM

## 2018-01-11 LAB — T4, FREE: FREE T4: 1.05 ng/dL (ref 0.60–1.60)

## 2018-01-11 LAB — TSH: TSH: 0.2 u[IU]/mL — ABNORMAL LOW (ref 0.35–4.50)

## 2018-01-11 LAB — VITAMIN D 25 HYDROXY (VIT D DEFICIENCY, FRACTURES): VITD: 36.69 ng/mL (ref 30.00–100.00)

## 2018-01-16 ENCOUNTER — Telehealth: Payer: Self-pay

## 2018-01-16 DIAGNOSIS — E890 Postprocedural hypothyroidism: Secondary | ICD-10-CM

## 2018-01-16 MED ORDER — LEVOTHYROXINE SODIUM 100 MCG PO TABS: 100.0000 ug | ORAL_TABLET | Freq: Every day | ORAL | 3 refills | Status: DC

## 2018-01-16 NOTE — Telephone Encounter (Signed)
Notified patient of message from Dr. Cruzita Lederer, patient expressed understanding and agreement. No further questions.  Medication sent. Labs ordered.

## 2018-01-16 NOTE — Telephone Encounter (Signed)
-----   Message from [name], MD sent at 01/12/2018  3:25 PM EST ----- [name], can you please call pt: Patient's thyroid tests are better, but still abnormal so we need to decrease her levothyroxine dose even further, to 100 mcg daily.  Can you please call this in to her pharmacy and take the other dose of her latest.  She will need to come for labs again in 1.5 months.  Can you please order a TSH and free T4? Her vitamin D is normal now.

## 2018-03-06 ENCOUNTER — Other Ambulatory Visit: Payer: Self-pay

## 2018-03-06 ENCOUNTER — Other Ambulatory Visit (INDEPENDENT_AMBULATORY_CARE_PROVIDER_SITE_OTHER): Payer: Medicare HMO

## 2018-03-06 DIAGNOSIS — E890 Postprocedural hypothyroidism: Secondary | ICD-10-CM

## 2018-03-06 LAB — TSH: TSH: 0.62 u[IU]/mL (ref 0.35–4.50)

## 2018-03-06 LAB — T4, FREE: Free T4: 0.91 ng/dL (ref 0.60–1.60)

## 2018-03-07 ENCOUNTER — Telehealth: Payer: Self-pay

## 2018-03-07 NOTE — Telephone Encounter (Signed)
-----   Message from [name], MD sent at 03/06/2018  5:08 PM EST ----- [name] [name], can you please call pt: Normal TFTs.

## 2018-03-07 NOTE — Telephone Encounter (Signed)
Notified patient of message from Dr. Gherghe, patient expressed understanding and agreement. No further questions.  

## 2018-05-19 ENCOUNTER — Encounter: Payer: Self-pay | Admitting: Internal Medicine

## 2018-05-19 ENCOUNTER — Ambulatory Visit (INDEPENDENT_AMBULATORY_CARE_PROVIDER_SITE_OTHER): Payer: Medicare Other | Admitting: Internal Medicine

## 2018-05-19 ENCOUNTER — Other Ambulatory Visit: Payer: Self-pay

## 2018-05-19 DIAGNOSIS — C73 Malignant neoplasm of thyroid gland: Secondary | ICD-10-CM

## 2018-05-19 DIAGNOSIS — E210 Primary hyperparathyroidism: Secondary | ICD-10-CM | POA: Diagnosis not present

## 2018-05-19 DIAGNOSIS — E559 Vitamin D deficiency, unspecified: Secondary | ICD-10-CM

## 2018-05-19 DIAGNOSIS — E890 Postprocedural hypothyroidism: Secondary | ICD-10-CM | POA: Diagnosis not present

## 2018-05-19 NOTE — Patient Instructions (Signed)
Please continue 100 mcg daily of Levothyroxine.  Take the thyroid hormone every day, with water, at least 30 minutes before breakfast, separated by at least 4 hours from: - acid reflux medications - calcium - iron - multivitamins  Please increase Vitamin D to 5000 units 2x weekly.  Please come back for a follow-up appointment in 6 months.

## 2018-05-19 NOTE — Progress Notes (Signed)
Patient ID: [name] [name], female   DOB: 11-02-1950, 68 y.o.   MRN: [id number]   Patient location: Home My location: Office  Referring Provider: Dr. Harlow Asa  I connected with the patient on 05/19/18 at 8:38 AM EDT by telephone and verified that I am speaking with the correct person.   I discussed the limitations of evaluation and management by telephone and the availability of in person appointments. The patient expressed understanding and agreed to proceed.   Details of the encounter are shown below.  HPI  [name] [name] is a 68 y.o.-year-old female, initially referred by Dr. Harlow Asa, presenting for follow-up for thyroid cancer, postsurgical hypothyroidism, history of primary hyperparathyroidism (s/p parathyroidectomy) and history of vitamin D deficiency.  Last visit 6 months ago.  Reviewed and addended her thyroid cancer history: Pt. has been dx with thyroid cancer in 08/2016, after having had neck exploration, left superior parathyroidectomy, and, as the mass was observed in the left thyroid lobe during surgery, she also had left lobectomy. 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 This was 2 cm, positive surgical margins.  Diagnosis 1. Parathyroid gland, Left Superior - ENLARGED PARATHYROID TISSUE, 0.445 GRAMS. 2. Thyroid, lobectomy, Left - PAPILLARY THYROID CARCINOMA, HURTHLE CELL VARIANT, 2.0 CM. - CARCINOMA IS PRESENT AT NON ORIENTED TISSUE EDGE(S). - SEE ONCOLOGY TABLE BELOW Microscopic Comment 2. THYROID Specimen: Left thyroid Procedure (including lymph node sampling if applicable): Hemithyroidectomy Specimen Integrity (intact/fragmented): Focally disrupted Tumor focality: Unifocal Dominant tumor: Maximum tumor size (cm): 2.0 cm (gross measurement) Tumor laterality: Left thyroid Histologic type (including subtype and/or unique features as applicable): Papillary thyroid carcinoma, Hurthle cell variant Tumor  capsule: Not identified Extrathyroidal extension: Not identified Capsular invasion with degree of invasion if present: N/A Margins: Present at non-oriented tissue edge(s). Lymphatic or vascular invasion: Not identified Lymph nodes: # examined 0; # positive; N/A Extracapsular extension (if applicable): Not identified TNM code: pT1b, pNX Non-neoplastic thyroid: Hashimoto's thyroiditis  Completion thyroidectomy on 12/09/2016: Right thyroid lobe without malignancy.  RAI tx (02/09/2017): 70.8 mCi I131  Posttreatment whole body scan (02/21/2017): Negative for metastasis: 1. Expected activity in the thyroidectomy bed. 2. No evidence of metastatic disease.  Neck ultrasound (11/18/2017): As expected after thyroidectomy.  No recurrences.  Patient thyroglobulin level was undetectable however, her thyroglobulin antibodies were previously elevated.  At last check, however, they became undetectable Lab Results  Component Value Date   THYROGLB <0.1 (L) 11/18/2017   THYROGLB <0.1 (L) 05/19/2017   THGAB <1.0 11/18/2017   THGAB 2 (H) 05/19/2017   Postsurgical hypothyroidism: -Previously uncontrolled  Pt is on levothyroxine 100 mcg daily (decreased at last visit), taken: - in am, 6:30 am - fasting - at least 30 min from b'fast - no Ca, Fe, MVI - + PPIs at bedtime - stopped Biotin  Latest TFTs normal: Lab Results  Component Value Date   TSH 0.62 03/06/2018   TSH 0.20 (L) 01/11/2018   TSH 0.07 (L) 11/18/2017   TSH 0.10 (L) 08/15/2017   TSH 0.03 (L) 05/19/2017   TSH 30.54 (H) 02/09/2017   TSH 3.50 10/27/2016   TSH 0.945 07/23/2014   FREET4 0.91 03/06/2018   FREET4 1.05 01/11/2018   FREET4 1.19 11/18/2017   FREET4 1.30 08/15/2017   FREET4 1.16 05/19/2017   FREET4 0.85 02/09/2017   FREET4 0.61 10/27/2016  12/21/2016: TSH 15.3  Pt denies: - feeling nodules in neck - dysphagia - SOB with lying down She has coughing and more choking  recently. She continues to have hoarseness. She  has GERD.  She has + FH of thyroid disorders in: sister. No FH of thyroid cancer. No h/o radiation tx to head or neck.  No herbal supplements. No Biotin use. No recent steroids use.   History of primary hyperparathyroidism: - s/p resection of a parathyroid adenoma weighing 445 mg in 08/2016  Reviewed pertinent labs: Normal PTH and calcium at last check: Lab Results  Component Value Date   PTH 21 01/19/2017   PTH Comment 01/19/2017   CALCIUM 8.9 11/18/2017   CALCIUM 9.1 01/19/2017   CALCIUM 8.5 (L) 12/10/2016   CALCIUM 9.0 12/06/2016   CALCIUM 8.9 08/25/2016   CALCIUM 11.1 (H) 07/23/2014   CALCIUM 10.4 10/09/2013   CALCIUM 9.4 01/30/2010   CALCIUM 8.9 05/10/2008   CALCIUM 8.8 04/14/2007   Vitamin D deficiency:  In 11/2017, we increased her vitamin D dose to 5000 units twice a week.  She went back to once a week dosing after results returned.  Reviewed vitamin D levels -latest level normalized on the higher vitamin D dose: Lab Results  Component Value Date   VD25OH 36.69 01/11/2018   VD25OH 28.30 (L) 11/18/2017   VD25OH 32.06 05/19/2017   VD25OH 28.28 (L) 02/09/2017   VD25OH 23.22 (L) 01/19/2017   VD25OH 20.09 (L) 10/27/2016   ROS: Constitutional: + weight gain/no weight loss, + fatigue, no subjective hyperthermia, no subjective hypothermia Eyes: no blurry vision, no xerophthalmia ENT: no sore throat, + see HPI Cardiovascular: no CP/no SOB/no palpitations/no leg swelling Respiratory: + cough/no SOB/no wheezing Gastrointestinal: no N/no V/no D/no C/+ acid reflux Musculoskeletal: no muscle aches/no joint aches Skin: no rashes, no hair loss Neurological: no tremors/no numbness/no tingling/no dizziness  I reviewed pt's medications, allergies, PMH, social hx, family hx, and changes were documented in the history of present illness. Otherwise, unchanged from my initial visit note.  Past Medical History:  Diagnosis Date  . Anxiety    because of parathyroid issues  .  Arthritis    hands  . Asthma    x 2, triggered by allergies  . Cancer ([place])    melanoma removed from back, thyroid  . Depression    bc of parathyroid problems  . GERD (gastroesophageal reflux disease)   . History of palpitations    none since 2016  . Pre-diabetes   . Seasonal allergies   . Varicose vein    Past Surgical History:  Procedure Laterality Date  . CARPAL TUNNEL RELEASE Right 02/05/2014   Procedure: RIGHT CARPAL TUNNEL RELEASE;  Surgeon: Jessy Oto, MD;  Location: [place];  Service: Orthopedics;  Laterality: Right;  . COLONOSCOPY    . EYE SURGERY     Bilateral Lasik  . LAPAROSCOPIC ASSISTED VAGINAL HYSTERECTOMY Bilateral 10/11/2013   Procedure: LAPAROSCOPIC ASSISTED VAGINAL HYSTERECTOMY BILATERAL SALPINGO OOPHORECTOMY;  Surgeon: Allena Katz, MD;  Location: [facility];  Service: Gynecology;  Laterality: Bilateral;  . Malignant melanoma resected     lower back area  . PARATHYROIDECTOMY Left 08/24/2016   Procedure: NECK EXPLORATION LEFT SUPERIOR PARATHYROIDECTOMY LEFT LOBE THYROIDECTOMY;  Surgeon: Armandina Gemma, MD;  Location: [place];  Service: General;  Laterality: Left;  . THYROIDECTOMY N/A 12/09/2016   Procedure: COMPLETION THYROIDECTOMY RIGHT LOBE;  Surgeon: Armandina Gemma, MD;  Location: [facility];  Service: General;  Laterality: N/A;  . trigger thumb surgery     right  . TUBAL LIGATION    . VARICOSE VEIN SURGERY     leg  Social History   Socioeconomic History  . Marital status: Married    Spouse name: Not on file  . Number of children: 2  . Years of education: Not on file  . Highest education level: Not on file  Occupational History  . Occupation: Retired  Scientific laboratory technician  . Financial resource strain: Not on file  . Food insecurity:    Worry: Not on file    Inability: Not on file  . Transportation needs:    Medical: Not on file    Non-medical: Not on file  Tobacco Use  . Smoking status: Former Smoker    Packs/day: 1.00    Years: 10.00     Pack years: 10.00    Types: Cigarettes    Last attempt to quit: 01/05/1991    Years since quitting: 27.3  . Smokeless tobacco: Never Used  Substance and Sexual Activity  . Alcohol use: No  . Drug use: No  . Sexual activity: Yes    Birth control/protection: Post-menopausal, Surgical  Lifestyle  . Physical activity:    Days per week: Not on file    Minutes per session: Not on file  . Stress: Not on file  Relationships  . Social connections:    Talks on phone: Not on file    Gets together: Not on file    Attends religious service: Not on file    Active member of club or organization: Not on file    Attends meetings of clubs or organizations: Not on file    Relationship status: Not on file  . Intimate partner violence:    Fear of current or ex partner: Not on file    Emotionally abused: Not on file    Physically abused: Not on file    Forced sexual activity: Not on file  Other Topics Concern  . Not on file  Social History Narrative  . Not on file   Current Outpatient Medications on File Prior to Visit  Medication Sig Dispense Refill  . aspirin EC 81 MG tablet Take 1 tablet (81 mg total) by mouth daily. 30 tablet 3  . Biotin 2500 MCG CAPS Take 5,000 mcg by mouth every evening.    . bisacodyl (DULCOLAX) 5 MG EC tablet Take 5 mg by mouth at bedtime.    . Carboxymethylcellulose Sodium (THERATEARS OP) Place 1 drop into both eyes 2 (two) times daily as needed (dry eyes).    . cetirizine (ZYRTEC) 10 MG tablet Take 10 mg by mouth daily as needed for allergies.     . Cholecalciferol (VITAMIN D3) 5000 units CAPS Take 5,000 Units by mouth every 7 (seven) days. Monday    . HYDROcodone-acetaminophen (NORCO/VICODIN) 5-325 MG tablet Take 1-2 tablets by mouth every 4 (four) hours as needed for moderate pain. 15 tablet 0  . levothyroxine (SYNTHROID, LEVOTHROID) 100 MCG tablet Take 1 tablet (100 mcg total) by mouth daily. 90 tablet 3  . minocycline (MINOCIN,DYNACIN) 100 MG capsule Take 100 mg by  mouth See admin instructions. Takes 171m daily for 2 weeks when she has "an outbreak".    . naproxen sodium (ALEVE) 220 MG tablet Take 220-440 mg by mouth 2 (two) times daily as needed (depends on pain if takes 1-2 tablets).    .Marland Kitchenomeprazole (PRILOSEC) 20 MG capsule Take 20 mg by mouth at bedtime.    . traMADol (ULTRAM) 50 MG tablet Take 1-2 tablets (50-100 mg total) by mouth every 6 (six) hours as needed for moderate pain or severe pain.  15 tablet 0   No current facility-administered medications on file prior to visit.    Allergies  Allergen Reactions  . Guaifenesin & Derivatives Nausea And Vomiting    Tremors, anxiety  . Penicillins     Pt states it does not work  . Sulfamethoxazole Other (See Comments)    Pt states it doesn't work and she doesn't want to take it    Family History  Problem Relation Age of Onset  . Diabetes Mother 65       deceased  . Heart failure Mother   . CAD Mother   . Heart attack Father 49       deceased  . Heart attack Sister 68       deceased  . Heart failure Brother 66       deceased    PE: There were no vitals taken for this visit. Wt Readings from Last 3 Encounters:  11/18/17 176 lb (79.8 kg)  05/19/17 174 lb (78.9 kg)  01/19/17 173 lb (78.5 kg)   Constitutional:  in NAD  The physical exam was not performed (telephone visit).  ASSESSMENT: 1. Thyroid cancer - see HPI  2. Postsurgical hypothyroidism  3. H/o HPTH  4. Vitamin D def  PLAN:  1. Thyroid cancer -papillary, Hurthle cell variant, - Patient has a history of incidental diagnosis of thyroid cancer after left lobectomy pursued during surgery for parathyroid adenoma.  At that time, the left lobe appeared abnormal and was resected.  She had a 2 cm PTC focus which appeared to be a Hurthle cell varian.  She is stage I TNM.  However, due to the fact that her the cell variant of PTC is more aggressive, I recommended completion thyroidectomy and RAI treatment afterwards.  She had  completion thyroidectomy by Dr. Harlow Asa on 12/09/2017 and then RAI treatment with 70.8 mCi I-131 on 02/09/2017. -At last visit, thyroglobulin level was undetectable.  She has a history of positive ATA antibodies so we will need to continue using the [facility] assay for her thyroglobulin -We discussed about neck ultrasound obtained after last visit (11/18/2017): No thyroid cancer recurrences -I will have the patient return in 6 months  2. Postsurgical hypothyroidism - latest thyroid labs reviewed with pt >> normal at last check in 03/2018, after we decreased the dose of her levothyroxine - she continues on LT4 100 mcg daily - pt feels good on this dose. - we discussed about taking the thyroid hormone every day, with water, >30 minutes before breakfast, separated by >4 hours from acid reflux medications, calcium, iron, multivitamins. Pt. is taking it correctly. - will check thyroid tests at next visit  3. H/o Primary HPTH - she is s/p resection of a parathyroid adenoma weighing 445 mg in 08/2016 -Her calcium level normalized after the surgery -Reviewed latest calcium level: Lab Results  Component Value Date   CALCIUM 8.9 11/18/2017   4. Vit D def -We increase the dose of vitamin D from 5000 units weekly to 5000 units twice a week at last visit  vitamin D from 11/2017 was slightly low, 28.3, but increased to 36.7 in 01/2018 after increasing the dose of her supplement.  Unfortunately, she now tells me that she went back to once a week dosing after the latest results returned.  I discussed with her that she will need to increase the dose back to twice a week since this improved a good dose for her. -We will need to recheck her vitamin D at next  visit  - time spent with the patient:  11 min, of which >50% was spent in obtaining information about her symptoms, reviewing her previous labs, evaluations, and treatments, counseling her about her condition (please see the discussed topics above), and  developing a plan to further investigate and treat it; she had a number of questions which I addressed.  [name], MD PhD [facility]

## 2018-07-03 DIAGNOSIS — E210 Primary hyperparathyroidism: Secondary | ICD-10-CM | POA: Diagnosis not present

## 2018-07-03 DIAGNOSIS — E039 Hypothyroidism, unspecified: Secondary | ICD-10-CM | POA: Diagnosis not present

## 2018-07-03 DIAGNOSIS — R7301 Impaired fasting glucose: Secondary | ICD-10-CM | POA: Diagnosis not present

## 2018-07-03 DIAGNOSIS — K219 Gastro-esophageal reflux disease without esophagitis: Secondary | ICD-10-CM | POA: Diagnosis not present

## 2018-07-07 DIAGNOSIS — E039 Hypothyroidism, unspecified: Secondary | ICD-10-CM | POA: Diagnosis not present

## 2018-07-07 DIAGNOSIS — L718 Other rosacea: Secondary | ICD-10-CM | POA: Diagnosis not present

## 2018-07-07 DIAGNOSIS — J301 Allergic rhinitis due to pollen: Secondary | ICD-10-CM | POA: Diagnosis not present

## 2018-07-07 DIAGNOSIS — R7301 Impaired fasting glucose: Secondary | ICD-10-CM | POA: Diagnosis not present

## 2018-07-07 DIAGNOSIS — K219 Gastro-esophageal reflux disease without esophagitis: Secondary | ICD-10-CM | POA: Diagnosis not present

## 2018-07-25 DIAGNOSIS — Z1231 Encounter for screening mammogram for malignant neoplasm of breast: Secondary | ICD-10-CM | POA: Diagnosis not present

## 2018-08-29 ENCOUNTER — Other Ambulatory Visit (INDEPENDENT_AMBULATORY_CARE_PROVIDER_SITE_OTHER): Payer: Medicare Other

## 2018-08-29 ENCOUNTER — Other Ambulatory Visit: Payer: Self-pay | Admitting: Internal Medicine

## 2018-08-29 ENCOUNTER — Other Ambulatory Visit: Payer: Self-pay

## 2018-08-29 DIAGNOSIS — E559 Vitamin D deficiency, unspecified: Secondary | ICD-10-CM

## 2018-08-29 DIAGNOSIS — E890 Postprocedural hypothyroidism: Secondary | ICD-10-CM | POA: Diagnosis not present

## 2018-08-29 DIAGNOSIS — C73 Malignant neoplasm of thyroid gland: Secondary | ICD-10-CM

## 2018-08-29 LAB — VITAMIN D 25 HYDROXY (VIT D DEFICIENCY, FRACTURES): VITD: 35.36 ng/mL (ref 30.00–100.00)

## 2018-08-29 LAB — T4, FREE: Free T4: 1.23 ng/dL (ref 0.60–1.60)

## 2018-08-29 LAB — TSH: TSH: 0.24 u[IU]/mL — ABNORMAL LOW (ref 0.35–4.50)

## 2018-08-30 LAB — THYROGLOBULIN LEVEL: Thyroglobulin: <0.1 ng/mL — ABNORMAL LOW

## 2018-08-30 LAB — THYROGLOBULIN ANTIBODY: Thyroglobulin Ab: 1 IU/mL (ref <=1)

## 2018-09-01 ENCOUNTER — Other Ambulatory Visit: Payer: Self-pay

## 2018-09-01 MED ORDER — LEVOTHYROXINE SODIUM 88 MCG PO TABS: 88.0000 ug | ORAL_TABLET | Freq: Every day | ORAL | 3 refills | Status: DC

## 2018-09-13 ENCOUNTER — Telehealth: Payer: Self-pay | Admitting: Internal Medicine

## 2018-09-13 NOTE — Telephone Encounter (Signed)
Patient has been getting upset with the pharmacy stating they are giving her the wrong thyroid medication and states they keep giving her euithrox?  Patient states since she has started taking it she is dizzy, frequently pacing out, no appetite, and heart racing, and out of mind. Informed the patient to contact PCP also due to the symptoms.  Please Advise, Thanks

## 2018-09-13 NOTE — Telephone Encounter (Signed)
Euthyrox should be the same as levothyroxine... Let's try to send Synthroid DAW the same dose and see how she does.

## 2018-09-13 NOTE — Telephone Encounter (Signed)
Dr. Cruzita Lederer please see media scan 01/16/2018 from the pharmacy regarding the change.

## 2018-09-14 ENCOUNTER — Other Ambulatory Visit: Payer: Self-pay

## 2018-09-14 ENCOUNTER — Emergency Department (HOSPITAL_COMMUNITY): Payer: Medicare Other

## 2018-09-14 ENCOUNTER — Encounter (HOSPITAL_COMMUNITY): Payer: Self-pay

## 2018-09-14 ENCOUNTER — Emergency Department (HOSPITAL_COMMUNITY)
Admission: EM | Admit: 2018-09-14 | Discharge: 2018-09-14 | Disposition: A | Discharge status: Home / Self Care | Payer: Medicare Other | Attending: Emergency Medicine | Admitting: Emergency Medicine

## 2018-09-14 DIAGNOSIS — Z85850 Personal history of malignant neoplasm of thyroid: Secondary | ICD-10-CM | POA: Diagnosis not present

## 2018-09-14 DIAGNOSIS — R42 Dizziness and giddiness: Secondary | ICD-10-CM | POA: Diagnosis not present

## 2018-09-14 DIAGNOSIS — J45909 Unspecified asthma, uncomplicated: Secondary | ICD-10-CM | POA: Insufficient documentation

## 2018-09-14 DIAGNOSIS — R002 Palpitations: Secondary | ICD-10-CM

## 2018-09-14 DIAGNOSIS — Z87891 Personal history of nicotine dependence: Secondary | ICD-10-CM | POA: Insufficient documentation

## 2018-09-14 DIAGNOSIS — R55 Syncope and collapse: Secondary | ICD-10-CM | POA: Diagnosis not present

## 2018-09-14 DIAGNOSIS — Z85820 Personal history of malignant melanoma of skin: Secondary | ICD-10-CM | POA: Diagnosis not present

## 2018-09-14 DIAGNOSIS — I447 Left bundle-branch block, unspecified: Secondary | ICD-10-CM | POA: Diagnosis not present

## 2018-09-14 DIAGNOSIS — E890 Postprocedural hypothyroidism: Secondary | ICD-10-CM | POA: Diagnosis not present

## 2018-09-14 DIAGNOSIS — R079 Chest pain, unspecified: Secondary | ICD-10-CM | POA: Diagnosis not present

## 2018-09-14 DIAGNOSIS — R4020 Unspecified coma: Secondary | ICD-10-CM | POA: Diagnosis not present

## 2018-09-14 DIAGNOSIS — R0689 Other abnormalities of breathing: Secondary | ICD-10-CM | POA: Diagnosis not present

## 2018-09-14 LAB — CBC WITH DIFFERENTIAL/PLATELET
Abs Immature Granulocytes: 0.02 10*3/uL (ref 0.00–0.07)
Basophils Absolute: 0 10*3/uL (ref 0.0–0.1)
Basophils Relative: 0 %
Eosinophils Absolute: 0 10*3/uL (ref 0.0–0.5)
Eosinophils Relative: 0 %
HCT: 38.7 % (ref 36.0–46.0)
Hemoglobin: 12.3 g/dL (ref 12.0–15.0)
Immature Granulocytes: 0 %
Lymphocytes Relative: 27 %
Lymphs Abs: 2.1 10*3/uL (ref 0.7–4.0)
MCH: 28.5 pg (ref 26.0–34.0)
MCHC: 31.8 g/dL (ref 30.0–36.0)
MCV: 89.6 fL (ref 80.0–100.0)
Monocytes Absolute: 0.4 10*3/uL (ref 0.1–1.0)
Monocytes Relative: 5 %
Neutro Abs: 5 10*3/uL (ref 1.7–7.7)
Neutrophils Relative %: 68 %
Platelets: 226 10*3/uL (ref 150–400)
RBC: 4.32 MIL/uL (ref 3.87–5.11)
RDW: 14.1 % (ref 11.5–15.5)
WBC: 7.5 10*3/uL (ref 4.0–10.5)
nRBC: 0 % (ref 0.0–0.2)

## 2018-09-14 LAB — TSH: TSH: 0.32 u[IU]/mL — ABNORMAL LOW (ref 0.350–4.500)

## 2018-09-14 LAB — COMPREHENSIVE METABOLIC PANEL
ALT: 18 U/L (ref 0–44)
AST: 17 U/L (ref 15–41)
Albumin: 3.6 g/dL (ref 3.5–5.0)
Alkaline Phosphatase: 60 U/L (ref 38–126)
Anion gap: 8 (ref 5–15)
BUN: 10 mg/dL (ref 8–23)
CO2: 24 mmol/L (ref 22–32)
Calcium: 8.2 mg/dL — ABNORMAL LOW (ref 8.9–10.3)
Chloride: 107 mmol/L (ref 98–111)
Creatinine, Ser: 0.66 mg/dL (ref 0.44–1.00)
GFR calc Af Amer: >60 mL/min (ref >60)
GFR calc non Af Amer: >60 mL/min (ref >60)
Glucose, Bld: 103 mg/dL — ABNORMAL HIGH (ref 70–99)
Potassium: 3.9 mmol/L (ref 3.5–5.1)
Sodium: 139 mmol/L (ref 135–145)
Total Bilirubin: 0.3 mg/dL (ref 0.3–1.2)
Total Protein: 6.5 g/dL (ref 6.5–8.1)

## 2018-09-14 LAB — TROPONIN I (HIGH SENSITIVITY)
Troponin I (High Sensitivity): 6 ng/L (ref <18)
Troponin I (High Sensitivity): 6 ng/L (ref <18)

## 2018-09-14 LAB — MAGNESIUM: Magnesium: 2.1 mg/dL (ref 1.7–2.4)

## 2018-09-14 MED ORDER — SYNTHROID 88 MCG PO TABS: 88.0000 ug | ORAL_TABLET | Freq: Every day | ORAL | 1 refills | Status: DC

## 2018-09-14 MED ORDER — SODIUM CHLORIDE 0.9 % IV BOLUS
500.0000 mL | Freq: Once | INTRAVENOUS | Status: AC
  Administered 2018-09-14: 500 mL via INTRAVENOUS

## 2018-09-14 NOTE — Discharge Instructions (Signed)
You were seen in the ED today with passing out. Please drink plenty of fluids and call the Cardiology office listed to schedule the next available appointment. Return to the ED with any new or suddenly worsening symptoms.

## 2018-09-14 NOTE — Telephone Encounter (Signed)
Synthroid DAW sent

## 2018-09-14 NOTE — ED Provider Notes (Signed)
Emergency Department Provider Note   I have reviewed the triage vital signs and the nursing notes.   HISTORY  Chief Complaint Palpitations   HPI [name] [name] is a 68 y.o. female with PMH of asthma and hypothyroidism presents to the ED with palpitations and intermittent syncope. Symptoms ongoing for the last 3 weeks.  Patient describes episodes of sudden loss of consciousness which are very brief and lasting only a few seconds.  When she regains consciousness she feels some chest discomfort with heart palpitations.  The symptoms lasted approximately 10 minutes and then resolved.  She feels like these episodes are increasing in frequency.  She reports 3 such episodes this morning which prompted a call to her endocrinologist who manages her thyroid medications.  She recently switched from levothyroxine 158mcg to your thyroxine 80 mcg.  She was referred to the emergency department after further discussing the symptoms.  She did receive aspirin 324 mg at urgent care prior to arrival.  She is not having active chest pain or palpitations upon arrival to the emergency department.  No fevers or chills.  Past Medical History:  Diagnosis Date  . Anxiety    because of parathyroid issues  . Arthritis    hands  . Asthma    x 2, triggered by allergies  . Cancer ([place])    melanoma removed from back, thyroid  . Depression    bc of parathyroid problems  . GERD (gastroesophageal reflux disease)   . History of palpitations    none since 2016  . Pre-diabetes   . Seasonal allergies   . Varicose vein     Patient Active Problem List   Diagnosis Date Noted  . Postsurgical hypothyroidism 05/19/2017  . Thyroid cancer ([place]) 10/27/2016  . Vitamin D deficiency 10/27/2016  . Primary hyperparathyroidism ([place]) 08/23/2016  . Carpal tunnel syndrome of right wrist 02/05/2014    Class: Acute  . Pelvic prolapse 10/11/2013  . ANXIETY STATE, UNSPECIFIED 03/20/2008  . PALPITATIONS 03/20/2008  .  DYSPNEA 03/20/2008  . Chest pain 03/20/2008  . HYPERLIPIDEMIA 03/19/2008  . Essential hypertension 03/19/2008    Past Surgical History:  Procedure Laterality Date  . CARPAL TUNNEL RELEASE Right 02/05/2014   Procedure: RIGHT CARPAL TUNNEL RELEASE;  Surgeon: Jessy Oto, MD;  Location: [place];  Service: Orthopedics;  Laterality: Right;  . COLONOSCOPY    . EYE SURGERY     Bilateral Lasik  . LAPAROSCOPIC ASSISTED VAGINAL HYSTERECTOMY Bilateral 10/11/2013   Procedure: LAPAROSCOPIC ASSISTED VAGINAL HYSTERECTOMY BILATERAL SALPINGO OOPHORECTOMY;  Surgeon: Allena Katz, MD;  Location: [facility];  Service: Gynecology;  Laterality: Bilateral;  . Malignant melanoma resected     lower back area  . PARATHYROIDECTOMY Left 08/24/2016   Procedure: NECK EXPLORATION LEFT SUPERIOR PARATHYROIDECTOMY LEFT LOBE THYROIDECTOMY;  Surgeon: Armandina Gemma, MD;  Location: [place];  Service: General;  Laterality: Left;  . THYROIDECTOMY N/A 12/09/2016   Procedure: COMPLETION THYROIDECTOMY RIGHT LOBE;  Surgeon: Armandina Gemma, MD;  Location: [facility];  Service: General;  Laterality: N/A;  . trigger thumb surgery     right  . TUBAL LIGATION    . VARICOSE VEIN SURGERY     leg    Allergies Guaifenesin & derivatives, Penicillins, and Sulfamethoxazole  Family History  Problem Relation Age of Onset  . Diabetes Mother 73       deceased  . Heart failure Mother   . CAD Mother   . Heart attack Father 69  deceased  . Heart attack Sister 59       deceased  . Heart failure Brother 3       deceased    Social History Social History   Tobacco Use  . Smoking status: Former Smoker    Packs/day: 1.00    Years: 10.00    Pack years: 10.00    Types: Cigarettes    Quit date: 01/05/1991    Years since quitting: 27.7  . Smokeless tobacco: Never Used  Substance Use Topics  . Alcohol use: No  . Drug use: No    Review of Systems  Constitutional: No fever/chills Eyes: No visual changes. ENT: No  sore throat. Cardiovascular: Positive chest pain. Positive syncope and palpitations.  Respiratory: Denies shortness of breath. Gastrointestinal: No abdominal pain.  No nausea, no vomiting.  No diarrhea.  No constipation. Genitourinary: Negative for dysuria. Musculoskeletal: Negative for back pain. Skin: Negative for rash. Neurological: Negative for headaches, focal weakness or numbness.  10-point ROS otherwise negative.  ____________________________________________   PHYSICAL EXAM:  VITAL SIGNS: ED Triage Vitals  Enc Vitals Group     BP 09/14/18 1236 (!) 148/80     Pulse Rate 09/14/18 1236 65     Resp 09/14/18 1236 19     Temp 09/14/18 1236 97.8 F (36.6 C)     Temp Source 09/14/18 1236 Oral     SpO2 09/14/18 1236 98 %     Weight 09/14/18 1234 170 lb (77.1 kg)     Height 09/14/18 1234 5' 4.5" (1.638 m)   Constitutional: Alert and oriented. Well appearing and in no acute distress. Eyes: Conjunctivae are normal.  Head: Atraumatic. Nose: No congestion/rhinnorhea. Mouth/Throat: Mucous membranes are moist.  Neck: No stridor.  Cardiovascular: Normal rate, regular rhythm. Good peripheral circulation. Grossly normal heart sounds.   Respiratory: Normal respiratory effort.  No retractions. Lungs CTAB. Gastrointestinal: Soft and nontender. No distention.  Musculoskeletal: No lower extremity tenderness nor edema. No gross deformities of extremities. Neurologic:  Normal speech and language. No gross focal neurologic deficits are appreciated.  Skin:  Skin is warm, dry and intact. No rash noted.  ____________________________________________   LABS (all labs ordered are listed, but only abnormal results are displayed)  Labs Reviewed  COMPREHENSIVE METABOLIC PANEL - Abnormal; Notable for the following components:      Result Value   Glucose, Bld 103 (*)    Calcium 8.2 (*)    All other components within normal limits  TSH - Abnormal; Notable for the following components:   TSH  0.320 (*)    All other components within normal limits  CBC WITH DIFFERENTIAL/PLATELET  MAGNESIUM  TROPONIN I (HIGH SENSITIVITY)  TROPONIN I (HIGH SENSITIVITY)   ____________________________________________  EKG   EKG Interpretation  Date/Time:  Thursday September 14 2018 12:35:07 EDT Ventricular Rate:  68 PR Interval:    QRS Duration: 155 QT Interval:  472 QTC Calculation: 502 R Axis:   -63 Text Interpretation:  Sinus rhythm Consider left atrial enlargement Left bundle branch block No STEMI  Confirmed by Nanda Quinton [phone]) on 09/14/2018 12:39:05 PM       ____________________________________________  RADIOLOGY  Dg Chest 2 View  Result Date: 09/14/2018 CLINICAL DATA:  Palpitations for approximately 3 weeks. EXAM: CHEST - 2 VIEW COMPARISON:  Chest radiograph dated 12/16/2016. FINDINGS: The heart size and mediastinal contours are within normal limits. Mild atelectasis versus scarring is seen in the left lung base. The right lung is clear. There is no pleural effusion or  pneumothorax. The visualized skeletal structures are unremarkable. Surgical clips overlie the lower neck. IMPRESSION: No acute cardiopulmonary disease. Electronically Signed   By: Zerita Boers M.D.   On: 09/14/2018 14:46   Ct Head Wo Contrast  Result Date: 09/14/2018 CLINICAL DATA:  Altered level of consciousness. EXAM: CT HEAD WITHOUT CONTRAST TECHNIQUE: Contiguous axial images were obtained from the base of the skull through the vertex without intravenous contrast. COMPARISON:  None. FINDINGS: Brain: No evidence of acute infarction, hemorrhage, hydrocephalus, extra-axial collection or mass lesion/mass effect. Vascular: No hyperdense vessel or unexpected calcification. Skull: Normal. Negative for fracture or focal lesion. Sinuses/Orbits: No acute finding. Other: None. IMPRESSION: No acute intracranial process. Electronically Signed   By: Zerita Boers M.D.   On: 09/14/2018 14:50     ____________________________________________   PROCEDURES  Procedure(s) performed:   Procedures  None ____________________________________________   INITIAL IMPRESSION / ASSESSMENT AND PLAN / ED COURSE  Pertinent labs & imaging results that were available during my care of the patient were reviewed by me and considered in my medical decision making (see chart for details).   Patient presents to the emergency department for evaluation of syncope with palpitations and chest discomfort.  She had 3 episodes today.  No symptoms currently.  No clear provoking factors.  Patient's EKG shows now completed left bundle branch block with associated widened QRS compared to her prior tracings.   Labs and imaging reviewed. No acute findings. No events on telemetry here. Patient to f/u with cardiology for consideration of ambulatory monitoring and syncope evaluation. Discussed ED return precautions in detail.  ____________________________________________  FINAL CLINICAL IMPRESSION(S) / ED DIAGNOSES  Final diagnoses:  Syncope, unspecified syncope type  Palpitations     MEDICATIONS GIVEN DURING THIS VISIT:  Medications  sodium chloride 0.9 % bolus 500 mL (0 mLs Intravenous Stopped 09/14/18 1356)     Note:  This document was prepared using Dragon voice recognition software and may include unintentional dictation errors.  Nanda Quinton, MD Emergency Medicine    Long, [facility], MD 09/15/18 [phone]

## 2018-09-14 NOTE — ED Triage Notes (Addendum)
Pt brought to ED via [facility] for heart palpitations. Pt states been going on for approx 3 weeks. Pt had a change in medication from Levothyroxine 176mcg to euthyrox 80 mcg and started noticing them after. Urgent care gave pt ASA 324 mg

## 2018-09-14 NOTE — Addendum Note (Signed)
Addended by: Cardell Peach I on: 09/14/2018 04:15 PM   Modules accepted: Orders

## 2018-09-20 ENCOUNTER — Other Ambulatory Visit: Payer: Self-pay

## 2018-09-20 MED ORDER — LEVOTHYROXINE SODIUM 88 MCG PO TABS: 88.0000 ug | ORAL_TABLET | Freq: Every day | ORAL | 3 refills | Status: DC

## 2018-10-03 DIAGNOSIS — E890 Postprocedural hypothyroidism: Secondary | ICD-10-CM | POA: Diagnosis not present

## 2018-10-03 DIAGNOSIS — C73 Malignant neoplasm of thyroid gland: Secondary | ICD-10-CM | POA: Diagnosis not present

## 2018-10-10 ENCOUNTER — Ambulatory Visit: Payer: Medicare Other | Admitting: Cardiovascular Disease

## 2018-10-12 NOTE — Progress Notes (Signed)
CARDIOLOGY CONSULT NOTE       Patient ID: [name] [name] MRN: [id number] DOB/AGE: 05/30/1950 68 y.o.  Admit date: (Not on file) Referring Physician: Nanda Quinton [facility] Primary Physician: Caryl Bis, MD Primary Cardiologist: New Reason for Consultation: Palpitations syncope  Active Problems:   * No active hospital problems. *   HPI:  68 y.o. history of anxiety, asthma, hypothyroidism seen in [facility] 09/14/18 complaining of palpitations and Intermittent syncope for 3 weeks She says LOC only lasts seconds when she "awakens" she has tightness in  Her chest and palpitations but not before. Symptoms worsened when she changed from levothyroxine to thryroxine  At lower dose She had no arrhythmias on telemetry in ER ECG in ER showed SR rate 68 with new LBBB compared to 12/11/16 She had a normal stress test in 2016 CXR and Head CT in ER showed NAD Troponin negative labs ok except TSH suppressed at 0.24 a month ago and 0.32 in ER Dr Cruzita Lederer has lowered her replacement dose   2016 saw Dr Percival Spanish for palpitations family history positive for premature CAD and had palpitations As above ETT was normal cand calcium score was 0 given PRN cardizem to take  She is feeling better since getting back on her old thyroid meds but still having spells of altered MS and dizziness With no frank syncope Palpitations seem to occur after spells  Denies history of head trauma or seizures   ROS All other systems reviewed and negative except as noted above  Past Medical History:  Diagnosis Date  . Anxiety    because of parathyroid issues  . Arthritis    hands  . Asthma    x 2, triggered by allergies  . Cancer ([place])    melanoma removed from back, thyroid  . Depression    bc of parathyroid problems  . GERD (gastroesophageal reflux disease)   . History of palpitations    none since 2016  . Pre-diabetes   . Seasonal allergies   . Varicose vein     Family History  Problem Relation Age of Onset   . Diabetes Mother 35       deceased  . Heart failure Mother   . CAD Mother   . Heart attack Father 65       deceased  . Heart attack Sister 43       deceased  . Heart failure Brother 65       deceased    Social History   Socioeconomic History  . Marital status: Married    Spouse name: Not on file  . Number of children: 2  . Years of education: Not on file  . Highest education level: Not on file  Occupational History  . Occupation: Retired  Scientific laboratory technician  . Financial resource strain: Not on file  . Food insecurity    Worry: Not on file    Inability: Not on file  . Transportation needs    Medical: Not on file    Non-medical: Not on file  Tobacco Use  . Smoking status: Former Smoker    Packs/day: 1.00    Years: 10.00    Pack years: 10.00    Types: Cigarettes    Quit date: 01/05/1991    Years since quitting: 27.8  . Smokeless tobacco: Never Used  Substance and Sexual Activity  . Alcohol use: No  . Drug use: No  . Sexual activity: Yes    Birth control/protection: Post-menopausal, Surgical  Lifestyle  .  Physical activity    Days per week: Not on file    Minutes per session: Not on file  . Stress: Not on file  Relationships  . Social Herbalist on phone: Not on file    Gets together: Not on file    Attends religious service: Not on file    Active member of club or organization: Not on file    Attends meetings of clubs or organizations: Not on file    Relationship status: Not on file  . Intimate partner violence    Fear of current or ex partner: Not on file    Emotionally abused: Not on file    Physically abused: Not on file    Forced sexual activity: Not on file  Other Topics Concern  . Not on file  Social History Narrative  . Not on file    Past Surgical History:  Procedure Laterality Date  . CARPAL TUNNEL RELEASE Right 02/05/2014   Procedure: RIGHT CARPAL TUNNEL RELEASE;  Surgeon: Jessy Oto, MD;  Location: [facility];  Service:  Orthopedics;  Laterality: Right;  . COLONOSCOPY    . EYE SURGERY     Bilateral Lasik  . LAPAROSCOPIC ASSISTED VAGINAL HYSTERECTOMY Bilateral 10/11/2013   Procedure: LAPAROSCOPIC ASSISTED VAGINAL HYSTERECTOMY BILATERAL SALPINGO OOPHORECTOMY;  Surgeon: Allena Katz, MD;  Location: [facility];  Service: Gynecology;  Laterality: Bilateral;  . Malignant melanoma resected     lower back area  . PARATHYROIDECTOMY Left 08/24/2016   Procedure: NECK EXPLORATION LEFT SUPERIOR PARATHYROIDECTOMY LEFT LOBE THYROIDECTOMY;  Surgeon: Armandina Gemma, MD;  Location: [facility];  Service: General;  Laterality: Left;  . THYROIDECTOMY N/A 12/09/2016   Procedure: COMPLETION THYROIDECTOMY RIGHT LOBE;  Surgeon: Armandina Gemma, MD;  Location: [facility];  Service: General;  Laterality: N/A;  . trigger thumb surgery     right  . TUBAL LIGATION    . VARICOSE VEIN SURGERY     leg        Physical Exam: Blood pressure 122/73, pulse 75, temperature (!) 97.1 F (36.2 C), temperature source Temporal, height 5' 4.5" (1.638 m), weight 176 lb (79.8 kg).    Affect appropriate Healthy:  appears stated age 68: post thyroidectomy  Neck supple with no adenopathy JVP normal no bruits no thyromegaly Lungs clear with no wheezing and good diaphragmatic motion Heart:  S1/S2 no murmur, no rub, gallop or click PMI normal Abdomen: benighn, BS positve, no tenderness, no AAA no bruit.  No HSM or HJR Distal pulses intact with no bruits No edema Neuro non-focal Skin warm and dry No muscular weakness   Labs:   Lab Results  Component Value Date   WBC 7.5 09/14/2018   HGB 12.3 09/14/2018   HCT 38.7 09/14/2018   MCV 89.6 09/14/2018   PLT 226 09/14/2018   No results for input(s): NA, K, CL, CO2, BUN, CREATININE, CALCIUM, PROT, BILITOT, ALKPHOS, ALT, AST, GLUCOSE in the last 168 hours.  Invalid input(s): LABALBU No results found for: CKTOTAL, CKMB, CKMBINDEX, TROPONINI No results found for: CHOL No results found for: HDL No  results found for: LDLCALC No results found for: TRIG No results found for: CHOLHDL No results found for: LDLDIRECT    Radiology: No results found.  EKG: See HPI   ASSESSMENT AND PLAN:   1. Palpitations :  Unrevealing telemetry in ER New LBBB f/u event monitor ordered 2. LBBB: in setting of syncopal spells will order echo to r/o DCM and lexiscan  myovue to r/o CAD since she has some tightness in her chest after spells. Encouraging that she had normal ETT And calcium score 0 in 2016  3. Syncope:  See above episodes would be unusual for arrhythmia CT head negative suggest she see Neurology and be screened for seizures with EEG 4. Thyroid: over replacement and change in medications could contribute to palpitations f/u Dr Renne Crigler  Signed: Jenkins Rouge 10/17/2018, 10:40 AM

## 2018-10-17 ENCOUNTER — Ambulatory Visit: Payer: Medicare Other | Admitting: Cardiovascular Disease

## 2018-10-17 ENCOUNTER — Other Ambulatory Visit: Payer: Self-pay

## 2018-10-17 ENCOUNTER — Encounter: Payer: Self-pay | Admitting: Cardiovascular Disease

## 2018-10-17 VITALS — BP 122/73 | HR 75 | Temp 97.1°F | Ht 64.5 in | Wt 176.0 lb

## 2018-10-17 DIAGNOSIS — R55 Syncope and collapse: Secondary | ICD-10-CM | POA: Diagnosis not present

## 2018-10-17 NOTE — Patient Instructions (Signed)
Medication Instructions:  Your physician recommends that you continue on your current medications as directed. Please refer to the Current Medication list given to you today.   Labwork: NONE  Testing/Procedures: Your physician has requested that you have an echocardiogram. Echocardiography is a painless test that uses sound waves to create images of your heart. It provides your doctor with information about the size and shape of your heart and how well your heart's chambers and valves are working. This procedure takes approximately one hour. There are no restrictions for this procedure.  Your physician has requested that you have a lexiscan myoview. For further information please visit [email or web address]. Please follow instruction sheet, as given.  Your physician has recommended that you wear an event monitor. Event monitors are medical devices that record the heart's electrical activity. Doctors most often [place] these monitors to diagnose arrhythmias. Arrhythmias are problems with the speed or rhythm of the heartbeat. The monitor is a small, portable device. You can wear one while you do your normal daily activities. This is usually used to diagnose what is causing palpitations/syncope (passing out). (21 DAY)     Follow-Up: Your physician recommends that you schedule a follow-up appointment in: AS NEEDED    Any Other Special Instructions Will Be Listed Below (If Applicable).     If you need a refill on your cardiac medications before your next appointment, please call your pharmacy.

## 2018-10-20 ENCOUNTER — Ambulatory Visit (INDEPENDENT_AMBULATORY_CARE_PROVIDER_SITE_OTHER): Payer: Medicare Other

## 2018-10-20 DIAGNOSIS — R002 Palpitations: Secondary | ICD-10-CM | POA: Diagnosis not present

## 2018-10-20 DIAGNOSIS — R55 Syncope and collapse: Secondary | ICD-10-CM | POA: Diagnosis not present

## 2018-10-22 ENCOUNTER — Telehealth: Payer: Self-pay | Admitting: Cardiology

## 2018-10-22 NOTE — Telephone Encounter (Signed)
Monitoring company called pt with 4.5 sec pause and then CHB.  Around 1:28 today.  I call pt and she said she did get lightheaded and just went to lie down.  But this was a mild episode compared to others she has had.  Once we have reviewed strips will call her back.

## 2018-10-22 NOTE — Telephone Encounter (Signed)
I never rec'd strips from monitoring service - I have notified pt that if she has any symptoms to call EMS and go to ER, it was reported she had 4.5 sec pause followed by CHB and pt was lightheaded.  She is not alone and does not drive. Discussed with Dr. Sallyanne Kuster.

## 2018-10-23 ENCOUNTER — Telehealth: Payer: Self-pay

## 2018-10-23 ENCOUNTER — Other Ambulatory Visit: Payer: Self-pay

## 2018-10-23 DIAGNOSIS — R002 Palpitations: Secondary | ICD-10-CM

## 2018-10-23 DIAGNOSIS — R55 Syncope and collapse: Secondary | ICD-10-CM

## 2018-10-23 NOTE — Telephone Encounter (Signed)
Spoke to the patient.

## 2018-10-23 NOTE — Telephone Encounter (Signed)
I received a faxed [employer] Cardiac Report, which was discussed by Cecilie Kicks, NP on 10/18.  The patient had a 4.5 second pause @ 2:16 pm.  I showed Dr Johnsie Cancel today 10/19 and he recommends EP consult.

## 2018-10-23 NOTE — Telephone Encounter (Signed)
See phone note from 10/23/18

## 2018-10-25 DIAGNOSIS — Z23 Encounter for immunization: Secondary | ICD-10-CM | POA: Diagnosis not present

## 2018-10-26 ENCOUNTER — Other Ambulatory Visit: Payer: Self-pay

## 2018-10-26 ENCOUNTER — Encounter: Payer: Self-pay | Admitting: Internal Medicine

## 2018-10-26 ENCOUNTER — Encounter: Payer: Self-pay | Admitting: *Deleted

## 2018-10-26 ENCOUNTER — Ambulatory Visit: Payer: Medicare Other | Admitting: Internal Medicine

## 2018-10-26 VITALS — BP 146/73 | HR 78 | Temp 97.3°F | Ht 64.5 in | Wt 176.0 lb

## 2018-10-26 DIAGNOSIS — Z01818 Encounter for other preprocedural examination: Secondary | ICD-10-CM

## 2018-10-26 NOTE — Progress Notes (Signed)
HPI [name] [name] is referred today by Dr. Johnsie Cancel. She is a pleasant 68 yo woman with LBBB who was having near syncopal spells. She has worn a cardiac monitor which demonstrated pauses of up to 4.5 seconds during the day and while on no medications. The patient has no warning of the episodes. She notes that the episodes have occurred for several months. She thought that her thyroid was the problem as she is on thyroid supplementation and had her vendor changed. Her TSH was actually low indicating that there thyroid was a little high.  Allergies  Allergen Reactions  . Guaifenesin & Derivatives Nausea And Vomiting    Tremors, anxiety  . Penicillins     Pt states it does not work  . Sulfamethoxazole Other (See Comments)    Pt states it doesn't work and she doesn't want to take it      Current Outpatient Medications  Medication Sig Dispense Refill  . aspirin EC 81 MG tablet Take 1 tablet (81 mg total) by mouth daily. 30 tablet 3  . Carboxymethylcellulose Sodium (THERATEARS OP) Place 1 drop into both eyes 2 (two) times daily as needed (dry eyes).    . cetirizine (ZYRTEC) 10 MG tablet Take 10 mg by mouth daily as needed for allergies.     . Cholecalciferol (VITAMIN D3) 5000 units CAPS Take 5,000 Units by mouth 2 (two) times a week.    . levothyroxine (SYNTHROID) 88 MCG tablet Take 1 tablet (88 mcg total) by mouth daily. 90 tablet 3  . minocycline (MINOCIN,DYNACIN) 100 MG capsule Take 100 mg by mouth See admin instructions. Takes 100mg  daily for 2 weeks when she has "an outbreak".    . naproxen sodium (ALEVE) 220 MG tablet Take 220-440 mg by mouth 2 (two) times daily as needed (depends on pain if takes 1-2 tablets).    Marland Kitchen omeprazole (PRILOSEC) 20 MG capsule Take 20 mg by mouth at bedtime.     No current facility-administered medications for this visit.      Past Medical History:  Diagnosis Date  . Anxiety    because of parathyroid issues  . Arthritis    hands  . Asthma    x 2,  triggered by allergies  . Cancer ([place])    melanoma removed from back, thyroid  . Depression    bc of parathyroid problems  . GERD (gastroesophageal reflux disease)   . History of palpitations    none since 2016  . Pre-diabetes   . Seasonal allergies   . Varicose vein     ROS:   All systems reviewed and negative except as noted in the HPI.   Past Surgical History:  Procedure Laterality Date  . CARPAL TUNNEL RELEASE Right 02/05/2014   Procedure: RIGHT CARPAL TUNNEL RELEASE;  Surgeon: Jessy Oto, MD;  Location: [facility];  Service: Orthopedics;  Laterality: Right;  . COLONOSCOPY    . EYE SURGERY     Bilateral Lasik  . LAPAROSCOPIC ASSISTED VAGINAL HYSTERECTOMY Bilateral 10/11/2013   Procedure: LAPAROSCOPIC ASSISTED VAGINAL HYSTERECTOMY BILATERAL SALPINGO OOPHORECTOMY;  Surgeon: Allena Katz, MD;  Location: [facility];  Service: Gynecology;  Laterality: Bilateral;  . Malignant melanoma resected     lower back area  . PARATHYROIDECTOMY Left 08/24/2016   Procedure: NECK EXPLORATION LEFT SUPERIOR PARATHYROIDECTOMY LEFT LOBE THYROIDECTOMY;  Surgeon: Armandina Gemma, MD;  Location: [facility];  Service: General;  Laterality: Left;  . THYROIDECTOMY N/A 12/09/2016   Procedure: COMPLETION  THYROIDECTOMY RIGHT LOBE;  Surgeon: Armandina Gemma, MD;  Location: [facility];  Service: General;  Laterality: N/A;  . trigger thumb surgery     right  . TUBAL LIGATION    . VARICOSE VEIN SURGERY     leg     Family History  Problem Relation Age of Onset  . Diabetes Mother 31       deceased  . Heart failure Mother   . CAD Mother   . Heart attack Father 25       deceased  . Heart attack Sister 16       deceased  . Heart failure Brother 56       deceased     Social History   Socioeconomic History  . Marital status: Married    Spouse name: Not on file  . Number of children: 2  . Years of education: Not on file  . Highest education level: Not on file  Occupational History  .  Occupation: Retired  Scientific laboratory technician  . Financial resource strain: Not on file  . Food insecurity    Worry: Not on file    Inability: Not on file  . Transportation needs    Medical: Not on file    Non-medical: Not on file  Tobacco Use  . Smoking status: Former Smoker    Packs/day: 1.00    Years: 10.00    Pack years: 10.00    Types: Cigarettes    Quit date: 01/05/1991    Years since quitting: 27.8  . Smokeless tobacco: Never Used  Substance and Sexual Activity  . Alcohol use: No  . Drug use: No  . Sexual activity: Yes    Birth control/protection: Post-menopausal, Surgical  Lifestyle  . Physical activity    Days per week: Not on file    Minutes per session: Not on file  . Stress: Not on file  Relationships  . Social Herbalist on phone: Not on file    Gets together: Not on file    Attends religious service: Not on file    Active member of club or organization: Not on file    Attends meetings of clubs or organizations: Not on file    Relationship status: Not on file  . Intimate partner violence    Fear of current or ex partner: Not on file    Emotionally abused: Not on file    Physically abused: Not on file    Forced sexual activity: Not on file  Other Topics Concern  . Not on file  Social History Narrative  . Not on file     BP (!) 146/73   Pulse 78   Temp (!) 97.3 F (36.3 C)   Ht 5' 4.5" (1.638 m)   Wt 176 lb (79.8 kg)   SpO2 95%   BMI 29.74 kg/m   Physical Exam:  Well appearing NAD HEENT: Unremarkable Neck:  No JVD, no thyromegally Lymphatics:  No adenopathy Back:  No CVA tenderness Lungs:  Clear with no wheezes HEART:  Regular rate rhythm, no murmurs, no rubs, no clicks Abd:  soft, positive bowel sounds, no organomegally, no rebound, no guarding Ext:  2 plus pulses, no edema, no cyanosis, no clubbing Skin:  No rashes no nodules Neuro:  CN II through XII intact, motor grossly intact  EKG - reviewed. NSR with LBBB Cardiac monitor - NSR  with CHB/1:1 AV conduction and pauses during the day of over 4 seconds.  Assess/Plan: 1. Stokes  Adams syncope - she has documented CHB and LBBB. She will be scheduled for PPM. 2. LBBB - she will undergo a 2D echo prior to her procedure.  Mikle Bosworth.D.

## 2018-10-26 NOTE — H&P (View-Only) (Signed)
HPI [name] [name] is referred today by Dr. Johnsie Cancel. She is a pleasant 68 yo woman with LBBB who was having near syncopal spells. She has worn a cardiac monitor which demonstrated pauses of up to 4.5 seconds during the day and while on no medications. The patient has no warning of the episodes. She notes that the episodes have occurred for several months. She thought that her thyroid was the problem as she is on thyroid supplementation and had her vendor changed. Her TSH was actually low indicating that there thyroid was a little high.  Allergies  Allergen Reactions  . Guaifenesin & Derivatives Nausea And Vomiting    Tremors, anxiety  . Penicillins     Pt states it does not work  . Sulfamethoxazole Other (See Comments)    Pt states it doesn't work and she doesn't want to take it      Current Outpatient Medications  Medication Sig Dispense Refill  . aspirin EC 81 MG tablet Take 1 tablet (81 mg total) by mouth daily. 30 tablet 3  . Carboxymethylcellulose Sodium (THERATEARS OP) Place 1 drop into both eyes 2 (two) times daily as needed (dry eyes).    . cetirizine (ZYRTEC) 10 MG tablet Take 10 mg by mouth daily as needed for allergies.     . Cholecalciferol (VITAMIN D3) 5000 units CAPS Take 5,000 Units by mouth 2 (two) times a week.    . levothyroxine (SYNTHROID) 88 MCG tablet Take 1 tablet (88 mcg total) by mouth daily. 90 tablet 3  . minocycline (MINOCIN,DYNACIN) 100 MG capsule Take 100 mg by mouth See admin instructions. Takes 100mg  daily for 2 weeks when she has "an outbreak".    . naproxen sodium (ALEVE) 220 MG tablet Take 220-440 mg by mouth 2 (two) times daily as needed (depends on pain if takes 1-2 tablets).    Marland Kitchen omeprazole (PRILOSEC) 20 MG capsule Take 20 mg by mouth at bedtime.     No current facility-administered medications for this visit.      Past Medical History:  Diagnosis Date  . Anxiety    because of parathyroid issues  . Arthritis    hands  . Asthma    x 2,  triggered by allergies  . Cancer ([place])    melanoma removed from back, thyroid  . Depression    bc of parathyroid problems  . GERD (gastroesophageal reflux disease)   . History of palpitations    none since 2016  . Pre-diabetes   . Seasonal allergies   . Varicose vein     ROS:   All systems reviewed and negative except as noted in the HPI.   Past Surgical History:  Procedure Laterality Date  . CARPAL TUNNEL RELEASE Right 02/05/2014   Procedure: RIGHT CARPAL TUNNEL RELEASE;  Surgeon: Jessy Oto, MD;  Location: [facility];  Service: Orthopedics;  Laterality: Right;  . COLONOSCOPY    . EYE SURGERY     Bilateral Lasik  . LAPAROSCOPIC ASSISTED VAGINAL HYSTERECTOMY Bilateral 10/11/2013   Procedure: LAPAROSCOPIC ASSISTED VAGINAL HYSTERECTOMY BILATERAL SALPINGO OOPHORECTOMY;  Surgeon: Allena Katz, MD;  Location: [facility];  Service: Gynecology;  Laterality: Bilateral;  . Malignant melanoma resected     lower back area  . PARATHYROIDECTOMY Left 08/24/2016   Procedure: NECK EXPLORATION LEFT SUPERIOR PARATHYROIDECTOMY LEFT LOBE THYROIDECTOMY;  Surgeon: Armandina Gemma, MD;  Location: [facility];  Service: General;  Laterality: Left;  . THYROIDECTOMY N/A 12/09/2016   Procedure: COMPLETION  THYROIDECTOMY RIGHT LOBE;  Surgeon: Armandina Gemma, MD;  Location[facility];  Service: General;  Laterality: N/A;  . trigger thumb surgery     right  . TUBAL LIGATION    . VARICOSE VEIN SURGERY     leg     Family History  Problem Relation Age of Onset  . Diabetes Mother 50       deceased  . Heart failure Mother   . CAD Mother   . Heart attack Father 49       deceased  . Heart attack Sister 45       deceased  . Heart failure Brother 33       deceased     Social History   Socioeconomic History  . Marital status: Married    Spouse name: Not on file  . Number of children: 2  . Years of education: Not on file  . Highest education level: Not on file  Occupational History  .  Occupation: Retired  Scientific laboratory technician  . Financial resource strain: Not on file  . Food insecurity    Worry: Not on file    Inability: Not on file  . Transportation needs    Medical: Not on file    Non-medical: Not on file  Tobacco Use  . Smoking status: Former Smoker    Packs/day: 1.00    Years: 10.00    Pack years: 10.00    Types: Cigarettes    Quit date: 01/05/1991    Years since quitting: 27.8  . Smokeless tobacco: Never Used  Substance and Sexual Activity  . Alcohol use: No  . Drug use: No  . Sexual activity: Yes    Birth control/protection: Post-menopausal, Surgical  Lifestyle  . Physical activity    Days per week: Not on file    Minutes per session: Not on file  . Stress: Not on file  Relationships  . Social Herbalist on phone: Not on file    Gets together: Not on file    Attends religious service: Not on file    Active member of club or organization: Not on file    Attends meetings of clubs or organizations: Not on file    Relationship status: Not on file  . Intimate partner violence    Fear of current or ex partner: Not on file    Emotionally abused: Not on file    Physically abused: Not on file    Forced sexual activity: Not on file  Other Topics Concern  . Not on file  Social History Narrative  . Not on file     BP (!) 146/73   Pulse 78   Temp (!) 97.3 F (36.3 C)   Ht 5' 4.5" (1.638 m)   Wt 176 lb (79.8 kg)   SpO2 95%   BMI 29.74 kg/m   Physical Exam:  Well appearing NAD HEENT: Unremarkable Neck:  No JVD, no thyromegally Lymphatics:  No adenopathy Back:  No CVA tenderness Lungs:  Clear with no wheezes HEART:  Regular rate rhythm, no murmurs, no rubs, no clicks Abd:  soft, positive bowel sounds, no organomegally, no rebound, no guarding Ext:  2 plus pulses, no edema, no cyanosis, no clubbing Skin:  No rashes no nodules Neuro:  CN II through XII intact, motor grossly intact  EKG - reviewed. NSR with LBBB Cardiac monitor - NSR  with CHB/1:1 AV conduction and pauses during the day of over 4 seconds.  Assess/Plan: 1. Stokes  Adams syncope - she has documented CHB and LBBB. She will be scheduled for PPM. 2. LBBB - she will undergo a 2D echo prior to her procedure.  Mikle Bosworth.D.

## 2018-10-26 NOTE — Patient Instructions (Signed)
Medication Instructions:  Your physician recommends that you continue on your current medications as directed. Please refer to the Current Medication list given to you today.  *If you need a refill on your cardiac medications before your next appointment, please call your pharmacy*  Lab Work: Your physician recommends that you return for lab work in: Tomorrow  If you have labs (blood work) drawn today and your tests are completely normal, you will receive your results only by: Marland Kitchen MyChart Message (if you have MyChart) OR . A paper copy in the mail If you have any lab test that is abnormal or we need to change your treatment, we will call you to review the results.  Testing/Procedures: Your physician has recommended that you have a pacemaker inserted. A pacemaker is a small device that is placed under the skin of your chest or abdomen to help control abnormal heart rhythms. This device uses electrical pulses to prompt the heart to beat at a normal rate. Pacemakers are used to treat heart rhythms that are too slow. Wire (leads) are attached to the pacemaker that goes into the chambers of you heart. This is done in the hospital and usually requires and overnight stay. Please see the instruction sheet given to you today for more information.  Your physician has requested that you have an echocardiogram. Echocardiography is a painless test that uses sound waves to create images of your heart. It provides your doctor with information about the size and shape of your heart and how well your heart's chambers and valves are working. This procedure takes approximately one hour. There are no restrictions for this procedure.    Follow-Up: At [facility], you and your health needs are our priority.  As part of our continuing mission to provide you with exceptional heart care, we have created designated Provider Care Teams.  These Care Teams include your primary Cardiologist (physician) and Advanced Practice  Providers (APPs -  Physician Assistants and Nurse Practitioners) who all work together to provide you with the care you need, when you need it.  Your next appointment:   3 months  The format for your next appointment:   In Person  Provider:   Cristopher Peru, MD  Other Instructions Thank you for choosing [facility]!

## 2018-10-27 ENCOUNTER — Telehealth: Payer: Self-pay | Admitting: Internal Medicine

## 2018-10-27 ENCOUNTER — Encounter: Payer: Self-pay | Admitting: Internal Medicine

## 2018-10-27 ENCOUNTER — Telehealth: Payer: Self-pay

## 2018-10-27 ENCOUNTER — Ambulatory Visit (INDEPENDENT_AMBULATORY_CARE_PROVIDER_SITE_OTHER): Payer: Medicare Other | Admitting: Internal Medicine

## 2018-10-27 ENCOUNTER — Other Ambulatory Visit (HOSPITAL_COMMUNITY)
Admission: RE | Admit: 2018-10-27 | Discharge: 2018-10-27 | Disposition: A | Discharge status: Home / Self Care | Payer: Medicare Other | Source: Ambulatory Visit | Attending: Internal Medicine | Admitting: Internal Medicine

## 2018-10-27 ENCOUNTER — Ambulatory Visit (HOSPITAL_COMMUNITY)
Admission: RE | Admit: 2018-10-27 | Discharge: 2018-10-27 | Disposition: A | Discharge status: Home / Self Care | Payer: Medicare Other | Source: Ambulatory Visit | Attending: Cardiovascular Disease | Admitting: Cardiovascular Disease

## 2018-10-27 VITALS — BP 130/80 | HR 87 | Ht 64.5 in | Wt 175.0 lb

## 2018-10-27 DIAGNOSIS — Z20828 Contact with and (suspected) exposure to other viral communicable diseases: Secondary | ICD-10-CM | POA: Insufficient documentation

## 2018-10-27 DIAGNOSIS — C73 Malignant neoplasm of thyroid gland: Secondary | ICD-10-CM

## 2018-10-27 DIAGNOSIS — E890 Postprocedural hypothyroidism: Secondary | ICD-10-CM | POA: Diagnosis not present

## 2018-10-27 DIAGNOSIS — R55 Syncope and collapse: Secondary | ICD-10-CM | POA: Insufficient documentation

## 2018-10-27 DIAGNOSIS — E210 Primary hyperparathyroidism: Secondary | ICD-10-CM

## 2018-10-27 DIAGNOSIS — E559 Vitamin D deficiency, unspecified: Secondary | ICD-10-CM | POA: Diagnosis not present

## 2018-10-27 LAB — SARS CORONAVIRUS 2 (TAT 6-24 HRS): SARS Coronavirus 2: NEGATIVE

## 2018-10-27 LAB — ECHOCARDIOGRAM COMPLETE
Height: 64.5 in
Weight: 2800 oz

## 2018-10-27 LAB — TSH: TSH: 4.09 u[IU]/mL (ref 0.35–4.50)

## 2018-10-27 LAB — T4, FREE: Free T4: 0.9 ng/dL (ref 0.60–1.60)

## 2018-10-27 NOTE — Telephone Encounter (Signed)
Call placed to Pt.  Pt notified to arrive for procedure October 26 at 8:30 am  Pt indicates understanding.

## 2018-10-27 NOTE — Telephone Encounter (Signed)
Pt has pacemaker Monday, stress test Wed, lm to r/s stress test

## 2018-10-27 NOTE — Patient Instructions (Signed)
Please continue 88  mcg daily of Levothyroxine.  Take the thyroid hormone every day, with water, at least 30 minutes before breakfast, separated by at least 4 hours from: - acid reflux medications - calcium - iron - multivitamins  Please increase Vitamin D to 5000 units 2x weekly.  Please come back for a follow-up appointment in 6 months.

## 2018-10-27 NOTE — Telephone Encounter (Signed)
Pt wants to know if she should have the stress tests done next week or if she needs to r/s them since she's having a pacemaker placed.

## 2018-10-27 NOTE — Progress Notes (Signed)
Patient ID: [name] [name], female   DOB: 09-17-1950, 68 y.o.   MRN: [id number]   HPI  [name] [name] is a 68 y.o.-year-old female, initially referred by Dr. Harlow Asa, presenting for follow-up for thyroid cancer, postsurgical hypothyroidism, history of primary hyperparathyroidism (s/p parathyroidectomy) and history of vitamin D deficiency.  Last visit 6 months ago (virtual).  She had a syncopal episode on 09/14/2018.  She will have a pacemaker implanted 10/30/2018.  Reviewed and addended her thyroid cancer history Pt. has been dx with thyroid cancer in 08/2016, after having had neck exploration, left superior parathyroidectomy, and, as the mass was observed in the left thyroid lobe during surgery, she also had left lobectomy. 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 This was 2 cm, positive surgical margins.  Diagnosis 1. Parathyroid gland, Left Superior - ENLARGED PARATHYROID TISSUE, 0.445 GRAMS. 2. Thyroid, lobectomy, Left - PAPILLARY THYROID CARCINOMA, HURTHLE CELL VARIANT, 2.0 CM. - CARCINOMA IS PRESENT AT NON ORIENTED TISSUE EDGE(S). - SEE ONCOLOGY TABLE BELOW Microscopic Comment Specimen: Left thyroid Procedure (including lymph node sampling if applicable): Hemithyroidectomy Specimen Integrity (intact/fragmented): Focally disrupted Tumor focality: Unifocal Dominant tumor: Maximum tumor size (cm): 2.0 cm (gross measurement) Tumor laterality: Left thyroid Histologic type (including subtype and/or unique features as applicable): Papillary thyroid carcinoma, Hurthle cell variant Tumor capsule: Not identified Extrathyroidal extension: Not identified Capsular invasion with degree of invasion if present: N/A Margins: Present at non-oriented tissue edge(s). Lymphatic or vascular invasion: Not identified Lymph nodes: # examined 0; # positive; N/A Extracapsular extension (if applicable): Not identified TNM code: pT1b,  pNX Non-neoplastic thyroid: Hashimoto's thyroiditis  Completion thyroidectomy on 12/09/2016: Right thyroid lobe without malignancy.  RAI tx (02/09/2017): 70.8 mCi I131  Posttreatment whole body scan (02/21/2017): Negative for metastasis: 1. Expected activity in the thyroidectomy bed. 2. No evidence of metastatic disease.  Neck ultrasound (11/18/2017): As expected after thyroidectomy.  No recurrences.  Patient thyroglobulin level was undetectable however, her thyroglobulin antibodies were previously elevated.  At last check they were undetectable: Lab Results  Component Value Date   THYROGLB <0.1 (L) 08/29/2018   THYROGLB <0.1 (L) 11/18/2017   THYROGLB <0.1 (L) 05/19/2017   THGAB 1 08/29/2018   THGAB <1.0 11/18/2017   THGAB 2 (H) 05/19/2017   Postsurgical hypothyroidism: -Uncontrolled  She is on Levothyroxine 88 mcg daily.  She was on Euthyrox (as she had dizziness, headaches, heart racing, restlessness-retrospectively, these could have been caused by arrhythmia) >> then changed back to Levothyroxine  - in am (approximately 6: 30 AM) - fasting - at least 30 min from b'fast - no Ca, Fe - + MVI later in the day - + PPIs at bedtime - not on Biotin  Latest TFTs were reviewed: Lab Results  Component Value Date   TSH 0.320 (L) 09/14/2018   TSH 0.24 (L) 08/29/2018   TSH 0.62 03/06/2018   TSH 0.20 (L) 01/11/2018   TSH 0.07 (L) 11/18/2017   TSH 0.10 (L) 08/15/2017   TSH 0.03 (L) 05/19/2017   TSH 30.54 (H) 02/09/2017   TSH 3.50 10/27/2016   TSH 0.945 07/23/2014   FREET4 1.23 08/29/2018   FREET4 0.91 03/06/2018   FREET4 1.05 01/11/2018   FREET4 1.19 11/18/2017   FREET4 1.30 08/15/2017   FREET4 1.16 05/19/2017   FREET4 0.85 02/09/2017   FREET4 0.61 10/27/2016  12/21/2016: TSH 15.3  She denies: -Feeling nodules in her neck -Dysphagia -Shortness of breath with lying down She has GERD, hoarseness, and  at last visit she described more coughing and choking >> stable.  She  has + FH of thyroid disorders in: sister. No FH of thyroid cancer. No h/o radiation tx to head or neck.   No herbal supplements. No Biotin use. No recent steroids use.   History of primary hyperparathyroidism: - s/p resection of a parathyroid adenoma weighing 445 mg in 08/2016  Reviewed pertinent labs: Recent calcium was slightly low, possibly due to hydration: Lab Results  Component Value Date   PTH 21 01/19/2017   PTH Comment 01/19/2017   CALCIUM 8.2 (L) 09/14/2018   CALCIUM 8.9 11/18/2017   CALCIUM 9.1 01/19/2017   CALCIUM 8.5 (L) 12/10/2016   CALCIUM 9.0 12/06/2016   CALCIUM 8.9 08/25/2016   CALCIUM 11.1 (H) 07/23/2014   CALCIUM 10.4 10/09/2013   CALCIUM 9.4 01/30/2010   CALCIUM 8.9 05/10/2008   Vitamin D deficiency:  In 11/2017, we increased her vitamin D dose to 5000 units twice a week.  Before last visit, she went back to once a week dosing >> we increased to 2x a week.  She has a history of vitamin D deficiency: Lab Results  Component Value Date   VD25OH 35.36 08/29/2018   VD25OH 36.69 01/11/2018   VD25OH 28.30 (L) 11/18/2017   VD25OH 32.06 05/19/2017   VD25OH 28.28 (L) 02/09/2017   VD25OH 23.22 (L) 01/19/2017   VD25OH 20.09 (L) 10/27/2016   ROS: Constitutional: no weight gain/no weight loss, no fatigue, no subjective hyperthermia, no subjective hypothermia Eyes: no blurry vision, no xerophthalmia ENT: no sore throat, + see HPI Cardiovascular: no CP/no SOB/no palpitations/no leg swelling Respiratory: no cough/no SOB/no wheezing Gastrointestinal: no N/no V/no D/no C/no acid reflux Musculoskeletal: no muscle aches/no joint aches Skin: no rashes, no hair loss Neurological: no tremors/no numbness/no tingling/no dizziness  I reviewed pt's medications, allergies, PMH, social hx, family hx, and changes were documented in the history of present illness. Otherwise, unchanged from my initial visit note.  Past Medical History:  Diagnosis Date  . Anxiety    because  of parathyroid issues  . Arthritis    hands  . Asthma    x 2, triggered by allergies  . Cancer ([place])    melanoma removed from back, thyroid  . Depression    bc of parathyroid problems  . GERD (gastroesophageal reflux disease)   . History of palpitations    none since 2016  . Pre-diabetes   . Seasonal allergies   . Varicose vein    Past Surgical History:  Procedure Laterality Date  . CARPAL TUNNEL RELEASE Right 02/05/2014   Procedure: RIGHT CARPAL TUNNEL RELEASE;  Surgeon: Jessy Oto, MD;  Location: [facility];  Service: Orthopedics;  Laterality: Right;  . COLONOSCOPY    . EYE SURGERY     Bilateral Lasik  . LAPAROSCOPIC ASSISTED VAGINAL HYSTERECTOMY Bilateral 10/11/2013   Procedure: LAPAROSCOPIC ASSISTED VAGINAL HYSTERECTOMY BILATERAL SALPINGO OOPHORECTOMY;  Surgeon: Allena Katz, MD;  Location: [facility];  Service: Gynecology;  Laterality: Bilateral;  . Malignant melanoma resected     lower back area  . PARATHYROIDECTOMY Left 08/24/2016   Procedure: NECK EXPLORATION LEFT SUPERIOR PARATHYROIDECTOMY LEFT LOBE THYROIDECTOMY;  Surgeon: Armandina Gemma, MD;  Location: [facility];  Service: General;  Laterality: Left;  . THYROIDECTOMY N/A 12/09/2016   Procedure: COMPLETION THYROIDECTOMY RIGHT LOBE;  Surgeon: Armandina Gemma, MD;  Location: [facility];  Service: General;  Laterality: N/A;  . trigger thumb surgery     right  . TUBAL  LIGATION    . VARICOSE VEIN SURGERY     leg   Social History   Socioeconomic History  . Marital status: Married    Spouse name: Not on file  . Number of children: 2  . Years of education: Not on file  . Highest education level: Not on file  Occupational History  . Occupation: Retired  Scientific laboratory technician  . Financial resource strain: Not on file  . Food insecurity    Worry: Not on file    Inability: Not on file  . Transportation needs    Medical: Not on file    Non-medical: Not on file  Tobacco Use  . Smoking status: Former Smoker    Packs/day:  1.00    Years: 10.00    Pack years: 10.00    Types: Cigarettes    Quit date: 01/05/1991    Years since quitting: 27.8  . Smokeless tobacco: Never Used  Substance and Sexual Activity  . Alcohol use: No  . Drug use: No  . Sexual activity: Yes    Birth control/protection: Post-menopausal, Surgical  Lifestyle  . Physical activity    Days per week: Not on file    Minutes per session: Not on file  . Stress: Not on file  Relationships  . Social Herbalist on phone: Not on file    Gets together: Not on file    Attends religious service: Not on file    Active member of club or organization: Not on file    Attends meetings of clubs or organizations: Not on file    Relationship status: Not on file  . Intimate partner violence    Fear of current or ex partner: Not on file    Emotionally abused: Not on file    Physically abused: Not on file    Forced sexual activity: Not on file  Other Topics Concern  . Not on file  Social History Narrative  . Not on file   Current Outpatient Medications on File Prior to Visit  Medication Sig Dispense Refill  . aspirin EC 81 MG tablet Take 1 tablet (81 mg total) by mouth daily. (Patient not taking: Reported on 10/26/2018) 30 tablet 3  . Carboxymethylcellulose Sodium (THERATEARS OP) Place 2 drops into both eyes 2 (two) times daily.     . cetirizine (ZYRTEC) 10 MG tablet Take 10 mg by mouth daily.     Marland Kitchen levothyroxine (SYNTHROID) 88 MCG tablet Take 1 tablet (88 mcg total) by mouth daily. 90 tablet 3  . minocycline (MINOCIN,DYNACIN) 100 MG capsule Take 100 mg by mouth See admin instructions. Takes '100mg'$  daily for 2 weeks when she has "an outbreak".    . Multiple Vitamins-Minerals (EMERGEN-C IMMUNE PO) Take 1 packet by mouth daily.    . naproxen sodium (ALEVE) 220 MG tablet Take 220-440 mg by mouth daily as needed (headache/pain).     Marland Kitchen omeprazole (PRILOSEC) 20 MG capsule Take 20 mg by mouth at bedtime.     No current facility-administered  medications on file prior to visit.    Allergies  Allergen Reactions  . Guaifenesin & Derivatives Nausea And Vomiting    Tremors, anxiety  . Penicillins     Pt states it does not work  . Sulfamethoxazole Other (See Comments)    Pt states it doesn't work and she doesn't want to take it    Family History  Problem Relation Age of Onset  . Diabetes Mother 60  deceased  . Heart failure Mother   . CAD Mother   . Heart attack Father 27       deceased  . Heart attack Sister 76       deceased  . Heart failure Brother 49       deceased    PE: BP 130/80   Pulse 87   Ht 5' 4.5" (1.638 m) Comment: measured today without shoes  Wt 175 lb (79.4 kg)   BMI 29.57 kg/m  Wt Readings from Last 3 Encounters:  10/27/18 175 lb (79.4 kg)  10/26/18 176 lb (79.8 kg)  10/17/18 176 lb (79.8 kg)   Constitutional: overweight, in NAD Eyes: PERRLA, EOMI, no exophthalmos ENT: moist mucous membranes, no thyromegaly, no cervical lymphadenopathy Cardiovascular: RRR, No MRG Respiratory: CTA B Gastrointestinal: abdomen soft, NT, ND, BS+ Musculoskeletal: no deformities, strength intact in all 4 Skin: moist, warm, no rashes Neurological: no tremor with outstretched hands, DTR normal in all 4  ASSESSMENT: 1. Thyroid cancer - see HPI  2. Postsurgical hypothyroidism  3. H/o HPTH  4. Vitamin D def  PLAN:  1. Thyroid cancer -papillary, Hurthle cell variant -Patient has a history of incidental diagnosis of thyroid cancer after left lobectomy noticed during surgery for parathyroid adenoma.  At that time, the left lobe appeared abnormal and was resected.  She had a 2 cm PTC focus which appears to be a Hurthle cell variant.  She is stage I TNM. However, due to the fact that her the cell variant of PTC is more aggressive, I recommended completion thyroidectomy and RAI treatment afterwards.  She had completion thyroidectomy by Dr. Harlow Asa on 12/09/2017 and then RAI treatment with 70.8 mCi I-131 on  02/09/2017. -At last checks, thyroglobulin levels were undetectable.  She has a history of positive ATA antibodies so we will need to continue using the [facility] assay for her thyroglobulin. -We reviewed together latest ultrasound obtained approximately a year ago: No thyroid cancer recurrences or metastases were seen. -We will repeat the ultrasound now -I will have the patient return in 6 months  2. Postsurgical hypothyroidism - latest thyroid labs reviewed with pt >> TSH was suppressed a month ago, after which we decreased the dose of her levothyroxine. - of note, she had side effects to Euthyrox >> now on LT4 generic - she continues on LT4 88 mcg daily - pt feels good on this dose.  - we discussed about taking the thyroid hormone every day, with water, >30 minutes before breakfast, separated by >4 hours from acid reflux medications, calcium, iron, multivitamins. Pt. is taking it correctly. - will check thyroid tests today: TSH and fT4 - If labs are abnormal, she will need to return for repeat TFTs in 1.5 months  3. H/o Primary HPTH - she is s/p resection of a parathyroid adenoma weighing 445 mg in 08/2016 -Her calcium level normalized after the surgery, but it was slightly low at last check-this was checked during her hospitalization for syncope, so it could have been caused by hydration: Lab Results  Component Value Date   CALCIUM 8.2 (L) 09/14/2018   4. Vit D def -She is taking vitamin D 5000 units 2x a week -On this dose, her vitamin D was normal 2 months ago. -We will repeat this at next visit  Orders Placed This Encounter  Procedures  . US THYROID  . TSH  . T4, free   CC: DR. Harlow Asa  Component     Latest Ref Rng & Units 10/27/2018  TSH     0.35 - 4.50 uIU/mL 4.09  T4,Free(Direct)     0.60 - 1.60 ng/dL 0.90   Her TSH is now in the normal range.  This is close to the upper limit of normal, however, for now, I would suggest to continue the current dose of levothyroxine to  avoid putting any strain on her heart.  I would like to repeat her thyroid test in 3 months and may need to increase her levothyroxine dose then.  [name], MD PhD [facility]

## 2018-10-27 NOTE — Progress Notes (Signed)
*  PRELIMINARY RESULTS* Echocardiogram 2D Echocardiogram has been performed.  [name] [name] 10/27/2018, 12:54 PM

## 2018-10-28 ENCOUNTER — Other Ambulatory Visit (HOSPITAL_COMMUNITY)
Admission: RE | Admit: 2018-10-28 | Discharge: 2018-10-28 | Disposition: A | Discharge status: Home / Self Care | Payer: Medicare Other | Source: Ambulatory Visit | Attending: Internal Medicine | Admitting: Internal Medicine

## 2018-10-28 DIAGNOSIS — Z01818 Encounter for other preprocedural examination: Secondary | ICD-10-CM | POA: Diagnosis not present

## 2018-10-28 LAB — CBC
HCT: 39.3 % (ref 36.0–46.0)
Hemoglobin: 12.3 g/dL (ref 12.0–15.0)
MCH: 27.6 pg (ref 26.0–34.0)
MCHC: 31.3 g/dL (ref 30.0–36.0)
MCV: 88.3 fL (ref 80.0–100.0)
Platelets: 269 10*3/uL (ref 150–400)
RBC: 4.45 MIL/uL (ref 3.87–5.11)
RDW: 14.3 % (ref 11.5–15.5)
WBC: 7.3 10*3/uL (ref 4.0–10.5)
nRBC: 0 % (ref 0.0–0.2)

## 2018-10-28 LAB — BASIC METABOLIC PANEL
Anion gap: 6 (ref 5–15)
BUN: 16 mg/dL (ref 8–23)
CO2: 27 mmol/L (ref 22–32)
Calcium: 9 mg/dL (ref 8.9–10.3)
Chloride: 106 mmol/L (ref 98–111)
Creatinine, Ser: 0.75 mg/dL (ref 0.44–1.00)
GFR calc Af Amer: >60 mL/min (ref >60)
GFR calc non Af Amer: >60 mL/min (ref >60)
Glucose, Bld: 129 mg/dL — ABNORMAL HIGH (ref 70–99)
Potassium: 4.3 mmol/L (ref 3.5–5.1)
Sodium: 139 mmol/L (ref 135–145)

## 2018-10-30 ENCOUNTER — Ambulatory Visit (HOSPITAL_COMMUNITY)
Admission: RE | Admit: 2018-10-30 | Discharge: 2018-10-31 | Disposition: A | Discharge status: Home / Self Care | Payer: Medicare Other | Attending: Internal Medicine | Admitting: Internal Medicine

## 2018-10-30 ENCOUNTER — Encounter (HOSPITAL_COMMUNITY)
Admission: RE | Disposition: A | Discharge status: Home / Self Care | Payer: Self-pay | Source: Home / Self Care | Attending: Internal Medicine

## 2018-10-30 ENCOUNTER — Telehealth: Payer: Self-pay

## 2018-10-30 ENCOUNTER — Other Ambulatory Visit: Payer: Self-pay

## 2018-10-30 DIAGNOSIS — R55 Syncope and collapse: Secondary | ICD-10-CM | POA: Insufficient documentation

## 2018-10-30 DIAGNOSIS — M19042 Primary osteoarthritis, left hand: Secondary | ICD-10-CM | POA: Diagnosis not present

## 2018-10-30 DIAGNOSIS — R7303 Prediabetes: Secondary | ICD-10-CM | POA: Insufficient documentation

## 2018-10-30 DIAGNOSIS — Z79899 Other long term (current) drug therapy: Secondary | ICD-10-CM | POA: Diagnosis not present

## 2018-10-30 DIAGNOSIS — Z8249 Family history of ischemic heart disease and other diseases of the circulatory system: Secondary | ICD-10-CM | POA: Diagnosis not present

## 2018-10-30 DIAGNOSIS — Z79890 Hormone replacement therapy (postmenopausal): Secondary | ICD-10-CM | POA: Insufficient documentation

## 2018-10-30 DIAGNOSIS — Z833 Family history of diabetes mellitus: Secondary | ICD-10-CM | POA: Insufficient documentation

## 2018-10-30 DIAGNOSIS — Z880 Allergy status to penicillin: Secondary | ICD-10-CM | POA: Diagnosis not present

## 2018-10-30 DIAGNOSIS — Z950 Presence of cardiac pacemaker: Secondary | ICD-10-CM

## 2018-10-30 DIAGNOSIS — Z87891 Personal history of nicotine dependence: Secondary | ICD-10-CM | POA: Diagnosis not present

## 2018-10-30 DIAGNOSIS — K219 Gastro-esophageal reflux disease without esophagitis: Secondary | ICD-10-CM | POA: Insufficient documentation

## 2018-10-30 DIAGNOSIS — Z7982 Long term (current) use of aspirin: Secondary | ICD-10-CM | POA: Insufficient documentation

## 2018-10-30 DIAGNOSIS — I459 Conduction disorder, unspecified: Secondary | ICD-10-CM | POA: Diagnosis present

## 2018-10-30 DIAGNOSIS — J45909 Unspecified asthma, uncomplicated: Secondary | ICD-10-CM | POA: Diagnosis not present

## 2018-10-30 DIAGNOSIS — M19041 Primary osteoarthritis, right hand: Secondary | ICD-10-CM | POA: Diagnosis not present

## 2018-10-30 DIAGNOSIS — I442 Atrioventricular block, complete: Secondary | ICD-10-CM | POA: Diagnosis not present

## 2018-10-30 DIAGNOSIS — Z882 Allergy status to sulfonamides status: Secondary | ICD-10-CM | POA: Insufficient documentation

## 2018-10-30 HISTORY — PX: BIV PACEMAKER INSERTION CRT-P: EP1199

## 2018-10-30 LAB — SURGICAL PCR SCREEN
MRSA, PCR: NEGATIVE
Staphylococcus aureus: NEGATIVE

## 2018-10-30 SURGERY — BIV PACEMAKER INSERTION CRT-P

## 2018-10-30 MED ORDER — VANCOMYCIN HCL IN DEXTROSE 1-5 GM/200ML-% IV SOLN
1000.0000 mg | INTRAVENOUS | Status: AC
  Administered 2018-10-30: 1000 mg via INTRAVENOUS

## 2018-10-30 MED ORDER — VANCOMYCIN HCL IN DEXTROSE 1-5 GM/200ML-% IV SOLN
INTRAVENOUS | Status: AC
  Filled 2018-10-30: qty 200

## 2018-10-30 MED ORDER — MIDAZOLAM HCL 5 MG/5ML IJ SOLN
INTRAMUSCULAR | Status: DC | PRN
  Administered 2018-10-30: 2 mg via INTRAVENOUS
  Administered 2018-10-30: 1 mg via INTRAVENOUS

## 2018-10-30 MED ORDER — ASPIRIN EC 81 MG PO TBEC
81.0000 mg | DELAYED_RELEASE_TABLET | Freq: Every day | ORAL | Status: DC
  Administered 2018-10-31: 81 mg via ORAL
  Filled 2018-10-30: qty 1

## 2018-10-30 MED ORDER — LEVOTHYROXINE SODIUM 88 MCG PO TABS
88.0000 ug | ORAL_TABLET | Freq: Every day | ORAL | Status: DC
  Administered 2018-10-31: 88 ug via ORAL
  Filled 2018-10-30: qty 1

## 2018-10-30 MED ORDER — HYDROCORTISONE 1 % EX CREA
1.0000 "application " | TOPICAL_CREAM | CUTANEOUS | Status: DC | PRN
  Filled 2018-10-30: qty 28

## 2018-10-30 MED ORDER — MIDAZOLAM HCL 5 MG/5ML IJ SOLN
INTRAMUSCULAR | Status: AC
  Filled 2018-10-30: qty 5

## 2018-10-30 MED ORDER — HEPARIN (PORCINE) IN NACL 1000-0.9 UT/500ML-% IV SOLN
INTRAVENOUS | Status: AC
  Filled 2018-10-30: qty 500

## 2018-10-30 MED ORDER — LIDOCAINE HCL (PF) 1 % IJ SOLN
INTRAMUSCULAR | Status: DC | PRN
  Administered 2018-10-30: 60 mL

## 2018-10-30 MED ORDER — IOHEXOL 350 MG/ML SOLN
INTRAVENOUS | Status: DC | PRN
  Administered 2018-10-30: 11:00:00 40 mL

## 2018-10-30 MED ORDER — HEPARIN (PORCINE) IN NACL 1000-0.9 UT/500ML-% IV SOLN
INTRAVENOUS | Status: DC | PRN
  Administered 2018-10-30: 500 mL

## 2018-10-30 MED ORDER — FENTANYL CITRATE (PF) 100 MCG/2ML IJ SOLN
INTRAMUSCULAR | Status: DC | PRN
  Administered 2018-10-30: 25 ug via INTRAVENOUS
  Administered 2018-10-30: 12.5 ug via INTRAVENOUS

## 2018-10-30 MED ORDER — ACETAMINOPHEN 325 MG PO TABS
325.0000 mg | ORAL_TABLET | ORAL | Status: DC | PRN
  Administered 2018-10-30: 650 mg via ORAL
  Filled 2018-10-30: qty 2

## 2018-10-30 MED ORDER — NAPROXEN SODIUM 275 MG PO TABS
275.0000 mg | ORAL_TABLET | Freq: Every day | ORAL | Status: DC | PRN
  Filled 2018-10-30: qty 2

## 2018-10-30 MED ORDER — VANCOMYCIN HCL IN DEXTROSE 1-5 GM/200ML-% IV SOLN
1000.0000 mg | Freq: Two times a day (BID) | INTRAVENOUS | Status: AC
  Administered 2018-10-30: 1000 mg via INTRAVENOUS
  Filled 2018-10-30: qty 200

## 2018-10-30 MED ORDER — LIDOCAINE HCL (PF) 1 % IJ SOLN
INTRAMUSCULAR | Status: AC
  Filled 2018-10-30: qty 60

## 2018-10-30 MED ORDER — ONDANSETRON HCL 4 MG/2ML IJ SOLN: 4.0000 mg | Freq: Four times a day (QID) | INTRAMUSCULAR | Status: DC | PRN

## 2018-10-30 MED ORDER — CHLORHEXIDINE GLUCONATE 4 % EX LIQD
4.0000 "application " | Freq: Once | CUTANEOUS | Status: DC
  Filled 2018-10-30: qty 60

## 2018-10-30 MED ORDER — SODIUM CHLORIDE 0.9 % IV SOLN
80.0000 mg | INTRAVENOUS | Status: AC
  Administered 2018-10-30: 80 mg

## 2018-10-30 MED ORDER — MUPIROCIN 2 % EX OINT
TOPICAL_OINTMENT | CUTANEOUS | Status: AC
  Administered 2018-10-30: 1
  Filled 2018-10-30: qty 22

## 2018-10-30 MED ORDER — MINOCYCLINE HCL 100 MG PO CAPS
100.0000 mg | ORAL_CAPSULE | Freq: Two times a day (BID) | ORAL | Status: DC | PRN
  Filled 2018-10-30: qty 1

## 2018-10-30 MED ORDER — SODIUM CHLORIDE 0.9 % IV SOLN: INTRAVENOUS | Status: DC

## 2018-10-30 MED ORDER — SODIUM CHLORIDE 0.9 % IV SOLN
INTRAVENOUS | Status: DC
  Administered 2018-10-30: 11:00:00 via INTRAVENOUS

## 2018-10-30 MED ORDER — SODIUM CHLORIDE 0.9 % IV SOLN
INTRAVENOUS | Status: AC
  Filled 2018-10-30: qty 2

## 2018-10-30 MED ORDER — FENTANYL CITRATE (PF) 100 MCG/2ML IJ SOLN
INTRAMUSCULAR | Status: AC
  Filled 2018-10-30: qty 2

## 2018-10-30 SURGICAL SUPPLY — 16 items
CABLE SURGICAL S-101-97-12 (CABLE) ×2 IMPLANT
CATH HEX JOSEPH 2-5-2 65CM 6F (CATHETERS) ×1 IMPLANT
KIT ACCESSORY SELECTRA FIX CVD (MISCELLANEOUS) ×1 IMPLANT
LEAD SENTUS OTW QP L-85 408719 (Lead) IMPLANT
LEAD SOLIA S PRO MRI 45 (Lead) ×1 IMPLANT
LEAD SOLIA S PRO MRI 53 (Lead) ×1 IMPLANT
PACEMAKER EDORA HF-T QP 407137 (Pacemaker) IMPLANT
PAD PRO RADIOLUCENT 2001M-C (PAD) ×2 IMPLANT
PMKR EDORA HF-T QP 407137 (Pacemaker) ×2 IMPLANT
SELECTRA HOOK 45 CM 375529 (CATHETERS) ×1 IMPLANT
SENTUS OTW QP L-85 408719 (Lead) ×2 IMPLANT
SHEATH 7FR PRELUDE SNAP 13 (SHEATH) ×2 IMPLANT
SHEATH 9.5FR PRELUDE SNAP 13 (SHEATH) ×1 IMPLANT
TRAY PACEMAKER INSERTION (PACKS) ×2 IMPLANT
WIRE ACUITY WHISPER EDS 4648 (WIRE) ×1 IMPLANT
WIRE MAILMAN 182CM (WIRE) ×1 IMPLANT

## 2018-10-30 NOTE — Discharge Instructions (Signed)
° ° °  Supplemental Discharge Instructions for  °Pacemaker/Defibrillator Patients ° °Activity °No heavy lifting or vigorous activity with your left/right arm for 6 to 8 weeks.  Do not raise your left/right arm above your head for one week.  Gradually raise your affected arm as drawn below. ° °        ° °__         10/30                       10/31                     11/1                   11/2 ° °NO DRIVING for 1 week    ; you may begin driving on    11/2 . ° °WOUND CARE °- Keep the wound area clean and dry.  Do not get this area wet for one week. No showers for one week; you may shower on  11/2   . °- The tape/steri-strips on your wound will fall off; do not pull them off.  No bandage is needed on the site.  DO  NOT apply any creams, oils, or ointments to the wound area. °- If you notice any drainage or discharge from the wound, any swelling or bruising at the site, or you develop a fever > 101? F after you are discharged home, call the office at once. ° °Special Instructions °- You are still able to use cellular telephones; use the ear opposite the side where you have your pacemaker/defibrillator.  Avoid carrying your cellular phone near your device. °- When traveling through airports, show security personnel your identification card to avoid being screened in the metal detectors.  Ask the security personnel to use the hand wand. °- Avoid arc welding equipment, TENS units (transcutaneous nerve stimulators).  Call the office for questions about other devices. °- Avoid electrical appliances that are in poor condition or are not properly grounded. °- Microwave ovens are safe to be near or to operate. °  °

## 2018-10-30 NOTE — Telephone Encounter (Signed)
-----   Message from [name], MD sent at 10/27/2018  4:54 PM EDT ----- [name], can you please call pt: Her TSH is now in the normal range.  This is close to the upper limit of normal, however, for now, I would suggest to continue the current dose of levothyroxine to avoid putting any strain on her heart.  I would like to repeat her thyroid test in 3 months and may need to increase her levothyroxine dose then.  Can you please reorder a TSH and a free T4?

## 2018-10-30 NOTE — Interval H&P Note (Signed)
History and Physical Interval Note: In the interim, the patient has been found to have mild LV dysfunction. She will undergo biv PPM insertion.   10/30/2018 9:41 AM  [name] [name]  has presented today for surgery, with the diagnosis of Heart block.  The various methods of treatment have been discussed with the patient and family. After consideration of risks, benefits and other options for treatment, the patient has consented to  Procedure(s): PACEMAKER IMPLANT (N/A) as a surgical intervention.  The patient's history has been reviewed, patient examined, no change in status, stable for surgery.  I have reviewed the patient's chart and labs.  Questions were answered to the patient's satisfaction.     [name] [name]

## 2018-10-30 NOTE — Discharge Summary (Addendum)
ELECTROPHYSIOLOGY PROCEDURE DISCHARGE SUMMARY    Patient ID: [name] [name],  MRN: [id number], DOB/AGE: 70-Sep-1952 70 y.o.  Admit date: 10/30/2018 Discharge date: 10/31/2018  Primary Care Physician: Caryl Bis, MD Primary Cardiologist: Johnsie Cancel Electrophysiologist: Lovena Le  Primary Discharge Diagnosis:  Symptomatic intermittent complete heart block status post pacemaker implantation this admission  Secondary Discharge Diagnosis:  1.  GERD 2.  Pre-diabetes  Allergies  Allergen Reactions  . Guaifenesin & Derivatives Nausea And Vomiting    Tremors, anxiety  . Penicillins     Pt states it does not work  . Sulfamethoxazole Other (See Comments)    Pt states it doesn't work and she doesn't want to take it      Procedures This Admission:  1.  Implantation of a Biotronik CRTP on 10/30/18 by Dr Lovena Le. There were no immediate post procedure complications. 2.  CXR on 10/31/18 demonstrated no pneumothorax status post device implantation.   Brief HPI: [name] [name] is a 68 y.o. female was referred to electrophysiology in the outpatient setting for consideration of PPM implantation.  Past medical history includes syncope and intermittent complete heart block documented by monitor.  The patient has had symptomatic bradycardia without reversible causes identified.  Risks, benefits, and alternatives to PPM implantation were reviewed with the patient who wished to proceed.   Hospital Course:  The patient was admitted and underwent implantation of a Biotronik CRTP with details as outlined above.  She  was monitored on telemetry overnight which demonstrated SR with V pacing.  Left chest was without hematoma or ecchymosis.  The device was interrogated and found to be functioning normally.  CXR was obtained and demonstrated no pneumothorax status post device implantation.  Wound care, arm mobility, and restrictions were reviewed with the patient.  The patient was examined and  considered stable for discharge to home.    Physical Exam: Vitals:   10/30/18 1800 10/30/18 1900 10/30/18 2038 10/31/18 0625  BP: 127/66 120/74    Pulse: 74 77    Resp:      Temp:   98.5 F (36.9 C) 98.2 F (36.8 C)  TempSrc:   Oral Oral  SpO2: 96% 94%    Weight:    77.6 kg  Height:        GEN- The patient is well appearing, alert and oriented x 3 today.   HEENT: normocephalic, atraumatic; sclera clear, conjunctiva pink; hearing intact; oropharynx clear; neck supple  Lungs- Clear to ausculation bilaterally, normal work of breathing.  No wheezes, rales, rhonchi Heart- Regular rate and rhythm (paced) GI- soft, non-tender, non-distended, bowel sounds present  Extremities- no clubbing, cyanosis, or edema  MS- no significant deformity or atrophy Skin- warm and dry, no rash or lesion, left chest without hematoma/ecchymosis Psych- euthymic mood, full affect Neuro- strength and sensation are intact   Labs:   Lab Results  Component Value Date   WBC 7.3 10/28/2018   HGB 12.3 10/28/2018   HCT 39.3 10/28/2018   MCV 88.3 10/28/2018   PLT 269 10/28/2018    Recent Labs  Lab 10/28/18 1740  NA 139  K 4.3  CL 106  CO2 27  BUN 16  CREATININE 0.75  CALCIUM 9.0  GLUCOSE 129*    Discharge Medications:  Allergies as of 10/31/2018      Reactions   Guaifenesin & Derivatives Nausea And Vomiting   Tremors, anxiety   Penicillins    Pt states it does not work   Sulfamethoxazole Other (See Comments)  Pt states it doesn't work and she doesn't want to take it       Medication List    TAKE these medications   aspirin EC 81 MG tablet Take 1 tablet (81 mg total) by mouth daily.   cetirizine 10 MG tablet Commonly known as: ZYRTEC Take 10 mg by mouth daily.   EMERGEN-C IMMUNE PO Take 1 packet by mouth daily.   levothyroxine 88 MCG tablet Commonly known as: SYNTHROID Take 1 tablet (88 mcg total) by mouth daily.   minocycline 100 MG capsule Commonly known as: MINOCIN Take  100 mg by mouth See admin instructions. Takes 100mg  daily for 2 weeks when she has "an outbreak".   naproxen sodium 220 MG tablet Commonly known as: ALEVE Take 220-440 mg by mouth daily as needed (headache/pain).   omeprazole 20 MG capsule Commonly known as: PRILOSEC Take 20 mg by mouth at bedtime.   THERATEARS OP Place 2 drops into both eyes 2 (two) times daily.       Disposition:  Discharge Instructions    Diet - low sodium heart healthy   Complete by: As directed    Increase activity slowly   Complete by: As directed      Follow-up Information    [facility] Follow up on 11/16/2018.   Specialty: Cardiology Why: at 12Noon Contact information: [address] [place]       Evans Lance, MD Follow up on 02/23/2019.   Specialty: Cardiology Why: at 9:30am Contact information: [place]  [id number] [phone]           Duration of Discharge Encounter: Greater than 30 minutes including physician time.  Signed, Chanetta Marshall, NP 10/31/2018 8:23 AM   EP Attending  Patient seen and examined. Agree with the findings as noted above. The patient has done well overnight. PPM interrogation under my direction demonstrated normal biv PM function. CXR looks good. She will be discharged home with usual followup.  Mikle Bosworth.D.

## 2018-10-30 NOTE — Progress Notes (Signed)
Pt complained of itching to L posterior knee. She states " it feels raised like something bit me". Upon assessment, pt noted to have a raised area approx size of a dime to posterior medial bend of knee. Pt unsure if "bite" was present prior to arrival to unit. Bed and room searched for any visible signs of insects-none found. Area cleaned with alcohol and Hydrocortisone ordered per  Cardiology PRN orders. Will continue to monitor. Jessie Foot, RN

## 2018-10-31 ENCOUNTER — Ambulatory Visit (HOSPITAL_COMMUNITY): Payer: Medicare Other

## 2018-10-31 ENCOUNTER — Encounter (HOSPITAL_COMMUNITY): Payer: Self-pay | Admitting: Internal Medicine

## 2018-10-31 DIAGNOSIS — R55 Syncope and collapse: Secondary | ICD-10-CM | POA: Diagnosis not present

## 2018-10-31 DIAGNOSIS — J45909 Unspecified asthma, uncomplicated: Secondary | ICD-10-CM | POA: Diagnosis not present

## 2018-10-31 DIAGNOSIS — I442 Atrioventricular block, complete: Secondary | ICD-10-CM | POA: Diagnosis not present

## 2018-10-31 DIAGNOSIS — M19042 Primary osteoarthritis, left hand: Secondary | ICD-10-CM | POA: Diagnosis not present

## 2018-10-31 DIAGNOSIS — R7303 Prediabetes: Secondary | ICD-10-CM | POA: Diagnosis not present

## 2018-10-31 DIAGNOSIS — M19041 Primary osteoarthritis, right hand: Secondary | ICD-10-CM | POA: Diagnosis not present

## 2018-10-31 DIAGNOSIS — Z833 Family history of diabetes mellitus: Secondary | ICD-10-CM | POA: Diagnosis not present

## 2018-10-31 DIAGNOSIS — Z7982 Long term (current) use of aspirin: Secondary | ICD-10-CM | POA: Diagnosis not present

## 2018-10-31 DIAGNOSIS — Z882 Allergy status to sulfonamides status: Secondary | ICD-10-CM | POA: Diagnosis not present

## 2018-10-31 DIAGNOSIS — Z880 Allergy status to penicillin: Secondary | ICD-10-CM | POA: Diagnosis not present

## 2018-10-31 DIAGNOSIS — Z8249 Family history of ischemic heart disease and other diseases of the circulatory system: Secondary | ICD-10-CM | POA: Diagnosis not present

## 2018-10-31 DIAGNOSIS — K219 Gastro-esophageal reflux disease without esophagitis: Secondary | ICD-10-CM | POA: Diagnosis not present

## 2018-10-31 DIAGNOSIS — Z79899 Other long term (current) drug therapy: Secondary | ICD-10-CM | POA: Diagnosis not present

## 2018-10-31 DIAGNOSIS — Z87891 Personal history of nicotine dependence: Secondary | ICD-10-CM | POA: Diagnosis not present

## 2018-10-31 DIAGNOSIS — Z950 Presence of cardiac pacemaker: Secondary | ICD-10-CM | POA: Diagnosis not present

## 2018-10-31 NOTE — Telephone Encounter (Signed)
Left message for patient to return our call at [phone].  

## 2018-11-01 ENCOUNTER — Encounter (HOSPITAL_COMMUNITY): Payer: Medicare Other

## 2018-11-01 ENCOUNTER — Other Ambulatory Visit (HOSPITAL_COMMUNITY): Payer: Medicare Other

## 2018-11-01 NOTE — Telephone Encounter (Signed)
Stress test has been canceled and will be rescheduled in 8 weeks

## 2018-11-01 NOTE — Telephone Encounter (Signed)
Left message for patient to return our call at [phone].  

## 2018-11-03 ENCOUNTER — Telehealth: Payer: Self-pay

## 2018-11-03 MED ORDER — ENTRESTO 24-26 MG PO TABS: 1.0000 | ORAL_TABLET | Freq: Two times a day (BID) | ORAL | 6 refills | Status: DC

## 2018-11-03 NOTE — Telephone Encounter (Signed)
-----   Message from Josue Hector, MD sent at 10/27/2018  4:41 PM EDT ----- Ordered by Lovena Le getting pacer start low dose entresto for low EF f/u with Britany in [place] should be getting labs for pacer

## 2018-11-03 NOTE — Telephone Encounter (Signed)
Called pt. No answer. Left detailed message for pt to return call. Sent in RX to [facility] WITH 30 DAY FREE CARD.

## 2018-11-16 ENCOUNTER — Other Ambulatory Visit: Payer: Self-pay

## 2018-11-16 ENCOUNTER — Ambulatory Visit (INDEPENDENT_AMBULATORY_CARE_PROVIDER_SITE_OTHER): Payer: Medicare Other | Admitting: Student

## 2018-11-16 ENCOUNTER — Ambulatory Visit
Admission: RE | Admit: 2018-11-16 | Discharge: 2018-11-16 | Disposition: A | Discharge status: Home / Self Care | Payer: Medicare Other | Source: Ambulatory Visit | Attending: Internal Medicine | Admitting: Internal Medicine

## 2018-11-16 DIAGNOSIS — C73 Malignant neoplasm of thyroid gland: Secondary | ICD-10-CM

## 2018-11-16 DIAGNOSIS — I442 Atrioventricular block, complete: Secondary | ICD-10-CM | POA: Diagnosis not present

## 2018-11-16 LAB — CUP PACEART INCLINIC DEVICE CHECK
Date Time Interrogation Session: 20201112122708
Implantable Lead Implant Date: 20201026
Implantable Lead Implant Date: 20201026
Implantable Lead Implant Date: 20201026
Implantable Lead Location: 753858
Implantable Lead Location: 753859
Implantable Lead Location: 753860
Implantable Lead Model: 377
Implantable Lead Model: 377
Implantable Lead Model: 401183
Implantable Lead Serial Number: 81173669
Implantable Lead Serial Number: 81216766
Implantable Lead Serial Number: 81267228
Implantable Pulse Generator Implant Date: 20201026
Lead Channel Impedance Value: 487 Ohm
Lead Channel Impedance Value: 585 Ohm
Lead Channel Impedance Value: 643 Ohm
Lead Channel Pacing Threshold Amplitude: 0.6 V
Lead Channel Pacing Threshold Amplitude: 0.6 V
Lead Channel Pacing Threshold Amplitude: 1 V
Lead Channel Pacing Threshold Amplitude: 1 V
Lead Channel Pacing Threshold Amplitude: 1 V
Lead Channel Pacing Threshold Amplitude: 2.2 V
Lead Channel Pacing Threshold Amplitude: 2.3 V
Lead Channel Pacing Threshold Pulse Width: 0.4 ms
Lead Channel Pacing Threshold Pulse Width: 0.4 ms
Lead Channel Pacing Threshold Pulse Width: 0.4 ms
Lead Channel Pacing Threshold Pulse Width: 0.4 ms
Lead Channel Pacing Threshold Pulse Width: 0.4 ms
Lead Channel Pacing Threshold Pulse Width: 0.4 ms
Lead Channel Pacing Threshold Pulse Width: 0.4 ms
Lead Channel Sensing Intrinsic Amplitude: 14.5 mV
Lead Channel Sensing Intrinsic Amplitude: 15.7 mV
Lead Channel Sensing Intrinsic Amplitude: 2.1 mV
Lead Channel Sensing Intrinsic Amplitude: 2.1 mV
Lead Channel Sensing Intrinsic Amplitude: 2.9 mV
Lead Channel Sensing Intrinsic Amplitude: 3.5 mV
Lead Channel Setting Pacing Amplitude: 1.8 V
Lead Channel Setting Pacing Amplitude: 2 V
Lead Channel Setting Pacing Amplitude: 4.2 V
Lead Channel Setting Pacing Pulse Width: 0.4 ms
Lead Channel Setting Pacing Pulse Width: 0.4 ms
Pulse Gen Model: 407137
Pulse Gen Serial Number: 69580491

## 2018-11-16 NOTE — Progress Notes (Signed)
Wound check appointment. Steri-strips removed. Wound without redness or edema. Incision edges approximated, wound well healed. Normal device function. Thresholds, sensing, and impedances consistent with implant measurements. Device programmed at 3.5V/auto capture programmed on for extra safety margin until 3 month visit. Histogram distribution appropriate for patient and level of activity. No mode switches or high ventricular rates noted. Patient educated about wound care, arm mobility, lifting restrictions. ROV in 3 months with Dr. Taylor 

## 2018-11-20 ENCOUNTER — Other Ambulatory Visit: Payer: Medicare Other

## 2018-11-21 ENCOUNTER — Telehealth: Payer: Self-pay

## 2018-11-21 NOTE — Telephone Encounter (Signed)
Left message for patient to return our call at [phone].  

## 2018-11-21 NOTE — Telephone Encounter (Signed)
-----   Message from [name], MD sent at 11/16/2018  3:59 PM EST ----- [name], can you please call pt: Good news, no signs of thyroid cancer recurrence or metastases in neck on the recent ultrasound

## 2018-11-22 NOTE — Telephone Encounter (Signed)
Notified patient of message from Dr. Gherghe, patient expressed understanding and agreement. No further questions.  

## 2018-11-30 DIAGNOSIS — I442 Atrioventricular block, complete: Secondary | ICD-10-CM | POA: Diagnosis not present

## 2018-12-04 DIAGNOSIS — E872 Acidosis: Secondary | ICD-10-CM | POA: Diagnosis not present

## 2018-12-04 DIAGNOSIS — I10 Essential (primary) hypertension: Secondary | ICD-10-CM | POA: Diagnosis not present

## 2018-12-27 IMAGING — NM NM RAI THYROID CANCER W/ THYROGEN
1 series · 1 of 1 positions shown · non-contrast
Comparison: none

CLINICAL DATA: 66-year-old female with papillary thyroid carcinoma
with Hurthle cell variant. Largest tumor focus 2.0 cm. Stage T1b Nx.
Patient presents for remnant ablation

[Series 1: static · 2.07mm/px · 1 of 1 slices shown]
[im 1/1]
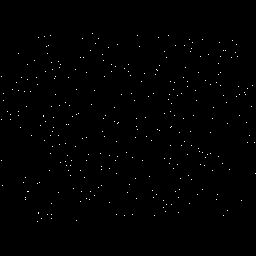

[1 of 1 positions shown; findings below may reference images not displayed]

EXAM:
RADIOACTIVE IODINE THERAPY FOR THYROID CANCER

PROCEDURE:
The risks and benefits of radioactive iodine therapy were discussed
with the patient in detail. Alternative therapies were also
mentioned. Radiation safety was discussed with the patient,
including how to protect the general public from exposure. There
were no barriers to communication. Written consent was obtained. The
patient then received a capsule containing the radiopharmaceutical.

The patient will follow-up with the referring physician.

RADIOPHARMACEUTICALS:  70.8 mCi 3-414 sodium iodide
FINDINGS: 66-year-old female with papillary thyroid carcinoma with Hurthle
cell variant. Largest tumor focus 2.0 cm. Stage T1b Nx following
total thyroidectomy. Patient presents for remnant ablation
IMPRESSION: Per oral administration of 3-414 sodium iodide for thyroid remnant
ablation..

## 2019-01-01 ENCOUNTER — Encounter: Payer: Self-pay | Admitting: Internal Medicine

## 2019-01-01 DIAGNOSIS — E210 Primary hyperparathyroidism: Secondary | ICD-10-CM | POA: Diagnosis not present

## 2019-01-01 DIAGNOSIS — R7301 Impaired fasting glucose: Secondary | ICD-10-CM | POA: Diagnosis not present

## 2019-01-01 DIAGNOSIS — K219 Gastro-esophageal reflux disease without esophagitis: Secondary | ICD-10-CM | POA: Diagnosis not present

## 2019-01-01 DIAGNOSIS — C73 Malignant neoplasm of thyroid gland: Secondary | ICD-10-CM | POA: Diagnosis not present

## 2019-01-02 ENCOUNTER — Ambulatory Visit (HOSPITAL_COMMUNITY): Payer: Medicare Other

## 2019-01-02 ENCOUNTER — Encounter (HOSPITAL_COMMUNITY): Payer: Medicare Other

## 2019-01-03 ENCOUNTER — Encounter (HOSPITAL_COMMUNITY)
Admission: RE | Admit: 2019-01-03 | Discharge: 2019-01-03 | Disposition: A | Discharge status: Home / Self Care | Payer: Medicare Other | Source: Ambulatory Visit | Attending: Cardiovascular Disease | Admitting: Cardiovascular Disease

## 2019-01-03 ENCOUNTER — Other Ambulatory Visit: Payer: Self-pay

## 2019-01-03 ENCOUNTER — Encounter (HOSPITAL_BASED_OUTPATIENT_CLINIC_OR_DEPARTMENT_OTHER)
Admission: RE | Admit: 2019-01-03 | Discharge: 2019-01-03 | Disposition: A | Discharge status: Home / Self Care | Payer: Medicare Other | Source: Ambulatory Visit | Attending: Cardiovascular Disease | Admitting: Cardiovascular Disease

## 2019-01-03 DIAGNOSIS — R55 Syncope and collapse: Secondary | ICD-10-CM | POA: Insufficient documentation

## 2019-01-03 LAB — NM MYOCAR MULTI W/SPECT W/WALL MOTION / EF
LV dias vol: 105 mL (ref 46–106)
LV sys vol: 63 mL
Peak HR: 107 {beats}/min
RATE: 0.52
Rest HR: 66 {beats}/min
SDS: 6
SRS: 2
SSS: 8
TID: 1.17

## 2019-01-03 MED ORDER — TECHNETIUM TC 99M TETROFOSMIN IV KIT
30.0000 | PACK | Freq: Once | INTRAVENOUS | Status: AC | PRN
  Administered 2019-01-03: 32.6 via INTRAVENOUS

## 2019-01-03 MED ORDER — REGADENOSON 0.4 MG/5ML IV SOLN
INTRAVENOUS | Status: AC
  Administered 2019-01-03: 0.4 mg via INTRAVENOUS
  Filled 2019-01-03: qty 5

## 2019-01-03 MED ORDER — TECHNETIUM TC 99M TETROFOSMIN IV KIT
10.0000 | PACK | Freq: Once | INTRAVENOUS | Status: AC | PRN
  Administered 2019-01-03: 10.75 via INTRAVENOUS

## 2019-01-03 MED ORDER — SODIUM CHLORIDE FLUSH 0.9 % IV SOLN
INTRAVENOUS | Status: AC
  Administered 2019-01-03: 10 mL via INTRAVENOUS
  Filled 2019-01-03: qty 10

## 2019-01-04 ENCOUNTER — Telehealth: Payer: Self-pay

## 2019-01-04 DIAGNOSIS — K219 Gastro-esophageal reflux disease without esophagitis: Secondary | ICD-10-CM | POA: Diagnosis not present

## 2019-01-04 DIAGNOSIS — E039 Hypothyroidism, unspecified: Secondary | ICD-10-CM | POA: Diagnosis not present

## 2019-01-04 NOTE — Progress Notes (Signed)
CARDIOLOGY CONSULT NOTE       Patient ID: [name] [name] MRN: [id number] DOB/AGE: December 27, 1950 68 y.o.  Admit date: (Not on file) Referring Physician: Nanda Quinton [facility] Primary Physician: Caryl Bis, MD Primary Cardiologist: Lajuana Ripple    Active Problems:   * No active hospital problems. *   HPI:  68 y.o. history of anxiety, asthma, hypothyroidism seen in [facility] 09/14/18 complaining of palpitations and Intermittent syncope for 3 weeks She says LOC only lasts seconds when she "awakens" she has tightness in  Her chest and palpitations but not before. Symptoms worsened when she changed from levothyroxine to thryroxine  At lower dose She had no arrhythmias on telemetry in ER ECG in ER showed SR rate 68 with new LBBB compared to 12/11/16 She had a normal stress test in 2016 CXR and Head CT in ER showed NAD Troponin negative labs ok except TSH suppressed at 0.24 a month ago and 0.32 in ER Dr Cruzita Lederer has lowered her replacement dose   2016 saw Dr Percival Spanish for palpitations family history positive for premature CAD and had palpitations As above ETT was normal cand calcium score was 0 given PRN cardizem to take  Event monitor showed high grade AV block  Echo 10/27/18 showed EF 40-45% mild MR Myovue 01/03/19 showed apical and lateral wall infarct with moderate peri infarct ischemia EF 30-44%  She had a Biotronik CRTP implanted by Dr Lovena Le on 10/30/18  Here to discuss abnormal myovue and need for cath. Risks including stroke MI contrast allergy and need for emergency surgery discussed willing to proceed. Will do right and left Consider starting coreg post cath pending results      ROS All other systems reviewed and negative except as noted above  Past Medical History:  Diagnosis Date  . Anxiety    because of parathyroid issues  . Arthritis    hands  . Asthma    x 2, triggered by allergies  . Cancer ([place])    melanoma removed from back, thyroid  . Depression    bc of  parathyroid problems  . GERD (gastroesophageal reflux disease)   . History of palpitations    none since 2016  . Pre-diabetes   . Seasonal allergies   . Varicose vein     Family History  Problem Relation Age of Onset  . Diabetes Mother 7       deceased  . Heart failure Mother   . CAD Mother   . Heart attack Father 34       deceased  . Heart attack Sister 75       deceased  . Heart failure Brother 7       deceased    Social History   Socioeconomic History  . Marital status: Married    Spouse name: Not on file  . Number of children: 2  . Years of education: Not on file  . Highest education level: Not on file  Occupational History  . Occupation: Retired  Tobacco Use  . Smoking status: Former Smoker    Packs/day: 1.00    Years: 10.00    Pack years: 10.00    Types: Cigarettes    Quit date: 01/05/1991    Years since quitting: 28.0  . Smokeless tobacco: Never Used  Substance and Sexual Activity  . Alcohol use: No  . Drug use: No  . Sexual activity: Yes    Birth control/protection: Post-menopausal, Surgical  Other Topics Concern  . Not on file  Social History Narrative  . Not on file   Social Determinants of Health   Financial Resource Strain:   . Difficulty of Paying Living Expenses: Not on file  Food Insecurity:   . Worried About Charity fundraiser in the Last Year: Not on file  . Ran Out of Food in the Last Year: Not on file  Transportation Needs:   . Lack of Transportation (Medical): Not on file  . Lack of Transportation (Non-Medical): Not on file  Physical Activity:   . Days of Exercise per Week: Not on file  . Minutes of Exercise per Session: Not on file  Stress:   . Feeling of Stress : Not on file  Social Connections:   . Frequency of Communication with Friends and Family: Not on file  . Frequency of Social Gatherings with Friends and Family: Not on file  . Attends Religious Services: Not on file  . Active Member of Clubs or Organizations: Not on  file  . Attends Archivist Meetings: Not on file  . Marital Status: Not on file  Intimate Partner Violence:   . Fear of Current or Ex-Partner: Not on file  . Emotionally Abused: Not on file  . Physically Abused: Not on file  . Sexually Abused: Not on file    Past Surgical History:  Procedure Laterality Date  . BIV PACEMAKER INSERTION CRT-P N/A 10/30/2018   Procedure: BIV PACEMAKER INSERTION CRT-P;  Surgeon: Evans Lance, MD;  Location: [facility];  Service: Cardiovascular;  Laterality: N/A;  . CARPAL TUNNEL RELEASE Right 02/05/2014   Procedure: RIGHT CARPAL TUNNEL RELEASE;  Surgeon: Jessy Oto, MD;  Location: [facility];  Service: Orthopedics;  Laterality: Right;  . COLONOSCOPY    . EYE SURGERY     Bilateral Lasik  . LAPAROSCOPIC ASSISTED VAGINAL HYSTERECTOMY Bilateral 10/11/2013   Procedure: LAPAROSCOPIC ASSISTED VAGINAL HYSTERECTOMY BILATERAL SALPINGO OOPHORECTOMY;  Surgeon: Allena Katz, MD;  Location: [facility];  Service: Gynecology;  Laterality: Bilateral;  . Malignant melanoma resected     lower back area  . PARATHYROIDECTOMY Left 08/24/2016   Procedure: NECK EXPLORATION LEFT SUPERIOR PARATHYROIDECTOMY LEFT LOBE THYROIDECTOMY;  Surgeon: Armandina Gemma, MD;  Location: [facility];  Service: General;  Laterality: Left;  . THYROIDECTOMY N/A 12/09/2016   Procedure: COMPLETION THYROIDECTOMY RIGHT LOBE;  Surgeon: Armandina Gemma, MD;  Location: [facility];  Service: General;  Laterality: N/A;  . trigger thumb surgery     right  . TUBAL LIGATION    . VARICOSE VEIN SURGERY     leg        Physical Exam: Temperature (!) 97.3 F (36.3 C), temperature source Temporal, height 5' 4.5" (1.638 m), weight 174 lb (78.9 kg), SpO2 95 %.    Affect appropriate Healthy:  appears stated age 69: post thyroidectomy  Neck supple with no adenopathy JVP normal no bruits no thyromegaly Lungs clear with no wheezing and good diaphragmatic motion Heart:  S1/S2 no murmur,  no rub, gallop or click PMI normal pacer under left clavicle  Abdomen: benighn, BS positve, no tenderness, no AAA no bruit.  No HSM or HJR Distal pulses intact with no bruits No edema Neuro non-focal Skin warm and dry No muscular weakness   Labs:   Lab Results  Component Value Date   WBC 7.3 10/28/2018   HGB 12.3 10/28/2018   HCT 39.3 10/28/2018   MCV 88.3 10/28/2018   PLT 269 10/28/2018     Radiology: NM  Myocar Multi W/Spect W/Wall Motion / EF  Result Date: 01/03/2019  There was no ST segment deviation noted during stress.  Findings consistent with prior apical moderate sized myocardial infarction. There is a small lateral infarct with with mild to moderate peri-infarct ischemia.  The left ventricular ejection fraction is moderately decreased (30-44%).  This is an intermediate risk study.     EKG: P synch pacing rate 69 10/31/18    ASSESSMENT AND PLAN:   1. Abnormal myovue:  With decreased EF suggestive of multi vessel CAD cath arranged see HPI risks discussed Orders written  2. Stokes Adams:  LBBB with AV block post CRT PPM Taylor 10/30/18 normal device function  3. Decreased EF:  ? Ischemic DCM started on entresto now that pacer is in start coreg 3.125 bid post cath   Cath to be done to r/o ischemic DCM 4. Thyroid: over replacement and change in medications could contribute to palpitations f/u Dr Renne Crigler  Cath lab called Orders written Monday with Dr Tamala Julian  Signed: Jenkins Rouge 01/10/2019, 9:52 AM

## 2019-01-04 NOTE — H&P (View-Only) (Signed)
CARDIOLOGY CONSULT NOTE       Patient ID: [name] [name] MRN: [id number] DOB/AGE: April 01, 1950 68 y.o.  Admit date: (Not on file) Referring Physician: Nanda Quinton [facility] Primary Physician: Caryl Bis, MD Primary Cardiologist: Lajuana Ripple    Active Problems:   * No active hospital problems. *   HPI:  68 y.o. history of anxiety, asthma, hypothyroidism seen in [facility] 09/14/18 complaining of palpitations and Intermittent syncope for 3 weeks She says LOC only lasts seconds when she "awakens" she has tightness in  Her chest and palpitations but not before. Symptoms worsened when she changed from levothyroxine to thryroxine  At lower dose She had no arrhythmias on telemetry in ER ECG in ER showed SR rate 68 with new LBBB compared to 12/11/16 She had a normal stress test in 2016 CXR and Head CT in ER showed NAD Troponin negative labs ok except TSH suppressed at 0.24 a month ago and 0.32 in ER Dr Cruzita Lederer has lowered her replacement dose   2016 saw Dr Percival Spanish for palpitations family history positive for premature CAD and had palpitations As above ETT was normal cand calcium score was 0 given PRN cardizem to take  Event monitor showed high grade AV block  Echo 10/27/18 showed EF 40-45% mild MR Myovue 01/03/19 showed apical and lateral wall infarct with moderate peri infarct ischemia EF 30-44%  She had a Biotronik CRTP implanted by Dr Lovena Le on 10/30/18  Here to discuss abnormal myovue and need for cath. Risks including stroke MI contrast allergy and need for emergency surgery discussed willing to proceed. Will do right and left Consider starting coreg post cath pending results      ROS All other systems reviewed and negative except as noted above  Past Medical History:  Diagnosis Date  . Anxiety    because of parathyroid issues  . Arthritis    hands  . Asthma    x 2, triggered by allergies  . Cancer ([place])    melanoma removed from back, thyroid  . Depression    bc of  parathyroid problems  . GERD (gastroesophageal reflux disease)   . History of palpitations    none since 2016  . Pre-diabetes   . Seasonal allergies   . Varicose vein     Family History  Problem Relation Age of Onset  . Diabetes Mother 68       deceased  . Heart failure Mother   . CAD Mother   . Heart attack Father 34       deceased  . Heart attack Sister 52       deceased  . Heart failure Brother 98       deceased    Social History   Socioeconomic History  . Marital status: Married    Spouse name: Not on file  . Number of children: 2  . Years of education: Not on file  . Highest education level: Not on file  Occupational History  . Occupation: Retired  Tobacco Use  . Smoking status: Former Smoker    Packs/day: 1.00    Years: 10.00    Pack years: 10.00    Types: Cigarettes    Quit date: 01/05/1991    Years since quitting: 28.0  . Smokeless tobacco: Never Used  Substance and Sexual Activity  . Alcohol use: No  . Drug use: No  . Sexual activity: Yes    Birth control/protection: Post-menopausal, Surgical  Other Topics Concern  . Not on file  Social History Narrative  . Not on file   Social Determinants of Health   Financial Resource Strain:   . Difficulty of Paying Living Expenses: Not on file  Food Insecurity:   . Worried About Charity fundraiser in the Last Year: Not on file  . Ran Out of Food in the Last Year: Not on file  Transportation Needs:   . Lack of Transportation (Medical): Not on file  . Lack of Transportation (Non-Medical): Not on file  Physical Activity:   . Days of Exercise per Week: Not on file  . Minutes of Exercise per Session: Not on file  Stress:   . Feeling of Stress : Not on file  Social Connections:   . Frequency of Communication with Friends and Family: Not on file  . Frequency of Social Gatherings with Friends and Family: Not on file  . Attends Religious Services: Not on file  . Active Member of Clubs or Organizations: Not on  file  . Attends Archivist Meetings: Not on file  . Marital Status: Not on file  Intimate Partner Violence:   . Fear of Current or Ex-Partner: Not on file  . Emotionally Abused: Not on file  . Physically Abused: Not on file  . Sexually Abused: Not on file    Past Surgical History:  Procedure Laterality Date  . BIV PACEMAKER INSERTION CRT-P N/A 10/30/2018   Procedure: BIV PACEMAKER INSERTION CRT-P;  Surgeon: Evans Lance, MD;  Location: [facility];  Service: Cardiovascular;  Laterality: N/A;  . CARPAL TUNNEL RELEASE Right 02/05/2014   Procedure: RIGHT CARPAL TUNNEL RELEASE;  Surgeon: Jessy Oto, MD;  Location: [facility];  Service: Orthopedics;  Laterality: Right;  . COLONOSCOPY    . EYE SURGERY     Bilateral Lasik  . LAPAROSCOPIC ASSISTED VAGINAL HYSTERECTOMY Bilateral 10/11/2013   Procedure: LAPAROSCOPIC ASSISTED VAGINAL HYSTERECTOMY BILATERAL SALPINGO OOPHORECTOMY;  Surgeon: Allena Katz, MD;  Location: [facility];  Service: Gynecology;  Laterality: Bilateral;  . Malignant melanoma resected     lower back area  . PARATHYROIDECTOMY Left 08/24/2016   Procedure: NECK EXPLORATION LEFT SUPERIOR PARATHYROIDECTOMY LEFT LOBE THYROIDECTOMY;  Surgeon: Armandina Gemma, MD;  Location: [facility];  Service: General;  Laterality: Left;  . THYROIDECTOMY N/A 12/09/2016   Procedure: COMPLETION THYROIDECTOMY RIGHT LOBE;  Surgeon: Armandina Gemma, MD;  [facility];  Service: General;  Laterality: N/A;  . trigger thumb surgery     right  . TUBAL LIGATION    . VARICOSE VEIN SURGERY     leg        Physical Exam: Temperature (!) 97.3 F (36.3 C), temperature source Temporal, height 5' 4.5" (1.638 m), weight 174 lb (78.9 kg), SpO2 95 %.    Affect appropriate Healthy:  appears stated age 33: post thyroidectomy  Neck supple with no adenopathy JVP normal no bruits no thyromegaly Lungs clear with no wheezing and good diaphragmatic motion Heart:  S1/S2 no murmur,  no rub, gallop or click PMI normal pacer under left clavicle  Abdomen: benighn, BS positve, no tenderness, no AAA no bruit.  No HSM or HJR Distal pulses intact with no bruits No edema Neuro non-focal Skin warm and dry No muscular weakness   Labs:   Lab Results  Component Value Date   WBC 7.3 10/28/2018   HGB 12.3 10/28/2018   HCT 39.3 10/28/2018   MCV 88.3 10/28/2018   PLT 269 10/28/2018     Radiology: NM  Myocar Multi W/Spect W/Wall Motion / EF  Result Date: 01/03/2019  There was no ST segment deviation noted during stress.  Findings consistent with prior apical moderate sized myocardial infarction. There is a small lateral infarct with with mild to moderate peri-infarct ischemia.  The left ventricular ejection fraction is moderately decreased (30-44%).  This is an intermediate risk study.     EKG: P synch pacing rate 69 10/31/18    ASSESSMENT AND PLAN:   1. Abnormal myovue:  With decreased EF suggestive of multi vessel CAD cath arranged see HPI risks discussed Orders written  2. Stokes Adams:  LBBB with AV block post CRT PPM Taylor 10/30/18 normal device function  3. Decreased EF:  ? Ischemic DCM started on entresto now that pacer is in start coreg 3.125 bid post cath   Cath to be done to r/o ischemic DCM 4. Thyroid: over replacement and change in medications could contribute to palpitations f/u Dr Renne Crigler  Cath lab called Orders written Monday with Dr Tamala Julian  Signed: Jenkins Rouge 01/10/2019, 9:52 AM

## 2019-01-04 NOTE — Telephone Encounter (Signed)
Pt made aware of results. Forwarded front office message to schedule follow up to discuss cath.

## 2019-01-04 NOTE — Telephone Encounter (Signed)
-----   Message from Josue Hector, MD sent at 01/03/2019  8:38 PM EST ----- Myovue abnormal suggesting CAD now that she has her pacer needs heart cath to r/o CAD can see [place] to set up cath

## 2019-01-08 DIAGNOSIS — K219 Gastro-esophageal reflux disease without esophagitis: Secondary | ICD-10-CM | POA: Diagnosis not present

## 2019-01-08 DIAGNOSIS — Z0001 Encounter for general adult medical examination with abnormal findings: Secondary | ICD-10-CM | POA: Diagnosis not present

## 2019-01-08 DIAGNOSIS — C73 Malignant neoplasm of thyroid gland: Secondary | ICD-10-CM | POA: Diagnosis not present

## 2019-01-08 DIAGNOSIS — R7301 Impaired fasting glucose: Secondary | ICD-10-CM | POA: Diagnosis not present

## 2019-01-08 DIAGNOSIS — E039 Hypothyroidism, unspecified: Secondary | ICD-10-CM | POA: Diagnosis not present

## 2019-01-08 DIAGNOSIS — Z1212 Encounter for screening for malignant neoplasm of rectum: Secondary | ICD-10-CM | POA: Diagnosis not present

## 2019-01-08 DIAGNOSIS — L718 Other rosacea: Secondary | ICD-10-CM | POA: Diagnosis not present

## 2019-01-08 DIAGNOSIS — Z6829 Body mass index (BMI) 29.0-29.9, adult: Secondary | ICD-10-CM | POA: Diagnosis not present

## 2019-01-10 ENCOUNTER — Other Ambulatory Visit: Payer: Self-pay | Admitting: Internal Medicine

## 2019-01-10 ENCOUNTER — Encounter: Payer: Self-pay | Admitting: Cardiovascular Disease

## 2019-01-10 ENCOUNTER — Telehealth: Payer: Self-pay

## 2019-01-10 ENCOUNTER — Other Ambulatory Visit: Payer: Self-pay | Admitting: Cardiovascular Disease

## 2019-01-10 ENCOUNTER — Other Ambulatory Visit: Payer: Self-pay

## 2019-01-10 ENCOUNTER — Ambulatory Visit: Payer: Medicare HMO | Admitting: Cardiovascular Disease

## 2019-01-10 ENCOUNTER — Encounter: Payer: Self-pay | Admitting: Internal Medicine

## 2019-01-10 VITALS — BP 124/75 | HR 85 | Temp 97.3°F | Ht 64.5 in | Wt 174.0 lb

## 2019-01-10 DIAGNOSIS — I420 Dilated cardiomyopathy: Secondary | ICD-10-CM

## 2019-01-10 DIAGNOSIS — I459 Conduction disorder, unspecified: Secondary | ICD-10-CM

## 2019-01-10 DIAGNOSIS — R9439 Abnormal result of other cardiovascular function study: Secondary | ICD-10-CM | POA: Diagnosis not present

## 2019-01-10 DIAGNOSIS — E890 Postprocedural hypothyroidism: Secondary | ICD-10-CM

## 2019-01-10 MED ORDER — SODIUM CHLORIDE 0.9% FLUSH: 3.0000 mL | Freq: Two times a day (BID) | INTRAVENOUS | Status: AC

## 2019-01-10 MED ORDER — LEVOTHYROXINE SODIUM 100 MCG PO TABS: 100.0000 ug | ORAL_TABLET | Freq: Every day | ORAL | 3 refills | Status: DC

## 2019-01-10 NOTE — Patient Instructions (Signed)
Medication Instructions:  Your physician recommends that you continue on your current medications as directed. Please refer to the Current Medication list given to you today.  *If you need a refill on your cardiac medications before your next appointment, please call your pharmacy*  Lab Work: Your physician recommends that you return for lab work in: Thursday   If you have labs (blood work) drawn today and your tests are completely normal, you will receive your results only by: Marland Kitchen MyChart Message (if you have MyChart) OR . A paper copy in the mail If you have any lab test that is abnormal or we need to change your treatment, we will call you to review the results.  Testing/Procedures: Your physician has requested that you have a cardiac catheterization. Cardiac catheterization is used to diagnose and/or treat various heart conditions. Doctors may recommend this procedure for a number of different reasons. The most common reason is to evaluate chest pain. Chest pain can be a symptom of coronary artery disease (CAD), and cardiac catheterization can show whether plaque is narrowing or blocking your heart's arteries. This procedure is also used to evaluate the valves, as well as measure the blood flow and oxygen levels in different parts of your heart. For further information please visit [email or web address]. Please follow instruction sheet, as given.    Follow-Up: At [facility], you and your health needs are our priority.  As part of our continuing mission to provide you with exceptional heart care, we have created designated Provider Care Teams.  These Care Teams include your primary Cardiologist (physician) and Advanced Practice Providers (APPs -  Physician Assistants and Nurse Practitioners) who all work together to provide you with the care you need, when you need it.  Your next appointment:   6 month(s)  The format for your next appointment:   In Person  Provider:   Jenkins Rouge,  MD  Other Instructions Thank you for choosing [facility]!       [address] [place] [address] Dept: [phone] Loc: [place]  01/10/2019  You are scheduled for a Cardiac Catheterization on Monday, January 11 with Dr. Daneen Schick.  1. Please arrive at the [facility] (Main Entrance A) at [facility]: [address] [place], [place] [address] at 8:30 AM (This time is two hours before your procedure to ensure your preparation). Free valet parking service is available.   Special note: Every effort is made to have your procedure done on time. Please understand that emergencies sometimes delay scheduled procedures.  2. Diet: Do not eat solid foods after midnight.  The patient may have clear liquids until 5am upon the day of the procedure.  3. Labs: You will need to have blood drawn on Thursday, January 7 at [facility] [address], [place]  Open: 7am - 6pm, Sat 8am - 12 noon   Phone: [phone]. You do not need to be fasting.  4. Medication instructions in preparation for your procedure:   Contrast Allergy: No  On the morning of your procedure, take your Aspirin and any morning medicines NOT listed above.  You may use sips of water.  5. Plan for one night stay--bring personal belongings. 6. Bring a current list of your medications and current insurance cards. 7. You MUST have a responsible person to drive you home. 8. Someone MUST be with you the first 24 hours after  you arrive home or your discharge will be delayed. 9. Please wear clothes that are easy to get on and off and wear slip-on shoes.  Thank you for allowing [place] to care for you!   -- [facility]

## 2019-01-10 NOTE — Telephone Encounter (Signed)
-----   Message from [name], MD sent at 01/10/2019 12:47 PM EST ----- [name], can you please call pt:  Received labs from 01/01/2019: TSH 7.16 (0.45-4.5). Since the TSH is elevated, we will need to increase her levothyroxine.  I sent a prescription for 100 mcg daily and she needs to come back for labs in 1.5 months.  Labs are in. Ty, C

## 2019-01-10 NOTE — Progress Notes (Signed)
Received labs from 01/01/2019: TSH 7.16 (0.45-4.5). Since the TSH is elevated, we will go ahead and increase her levothyroxine.

## 2019-01-11 ENCOUNTER — Telehealth: Payer: Self-pay | Admitting: *Deleted

## 2019-01-11 ENCOUNTER — Other Ambulatory Visit (HOSPITAL_COMMUNITY)
Admission: RE | Admit: 2019-01-11 | Discharge: 2019-01-11 | Disposition: A | Discharge status: Home / Self Care | Payer: Medicare HMO | Source: Ambulatory Visit | Attending: Interventional Cardiology | Admitting: Interventional Cardiology

## 2019-01-11 ENCOUNTER — Other Ambulatory Visit (HOSPITAL_COMMUNITY)
Admission: RE | Admit: 2019-01-11 | Discharge: 2019-01-11 | Disposition: A | Discharge status: Home / Self Care | Payer: Medicare HMO | Source: Ambulatory Visit | Attending: Cardiovascular Disease | Admitting: Cardiovascular Disease

## 2019-01-11 DIAGNOSIS — Z20822 Contact with and (suspected) exposure to covid-19: Secondary | ICD-10-CM | POA: Diagnosis not present

## 2019-01-11 DIAGNOSIS — Z01812 Encounter for preprocedural laboratory examination: Secondary | ICD-10-CM | POA: Insufficient documentation

## 2019-01-11 LAB — BASIC METABOLIC PANEL
Anion gap: 10 (ref 5–15)
BUN: 13 mg/dL (ref 8–23)
CO2: 25 mmol/L (ref 22–32)
Calcium: 8.7 mg/dL — ABNORMAL LOW (ref 8.9–10.3)
Chloride: 105 mmol/L (ref 98–111)
Creatinine, Ser: 0.72 mg/dL (ref 0.44–1.00)
GFR calc Af Amer: >60 mL/min (ref >60)
GFR calc non Af Amer: >60 mL/min (ref >60)
Glucose, Bld: 110 mg/dL — ABNORMAL HIGH (ref 70–99)
Potassium: 3.7 mmol/L (ref 3.5–5.1)
Sodium: 140 mmol/L (ref 135–145)

## 2019-01-11 LAB — CBC
HCT: 40.5 % (ref 36.0–46.0)
Hemoglobin: 12.8 g/dL (ref 12.0–15.0)
MCH: 28.3 pg (ref 26.0–34.0)
MCHC: 31.6 g/dL (ref 30.0–36.0)
MCV: 89.4 fL (ref 80.0–100.0)
Platelets: 254 10*3/uL (ref 150–400)
RBC: 4.53 MIL/uL (ref 3.87–5.11)
RDW: 14.9 % (ref 11.5–15.5)
WBC: 7.4 10*3/uL (ref 4.0–10.5)
nRBC: 0 % (ref 0.0–0.2)

## 2019-01-11 LAB — SARS CORONAVIRUS 2 (TAT 6-24 HRS): SARS Coronavirus 2: NEGATIVE

## 2019-01-11 NOTE — Telephone Encounter (Addendum)
Pt contacted pre-catheterization scheduled at [facility] for: Monday January 15, 2019 10:30 AM Verified arrival time and place: [facility]) at: 8:30 AM   No solid food after midnight prior to cath, clear liquids until 5 AM day of procedure. Contrast allergy: no   AM meds can be  taken pre-cath with sip of water including: ASA 81 mg   Confirmed patient has responsible adult to drive home post procedure and observe 24 hours after arriving home:  yes  Currently, due to Covid-19 pandemic, only one support person will be allowed with patient. Must be the same support person for that patient's entire stay, will be screened and required to wear a mask. They will be asked to wait in the waiting room for the duration of the patient's stay.  Patients are required to wear a mask when they enter the hospital.      COVID-19 Pre-Screening Questions:  . In the past 7 to 10 days have you had a cough,  shortness of breath, headache, congestion, fever (100 or greater) body aches, chills, sore throat, or sudden loss of taste or sense of smell? no . Have you been around anyone with known Covid 19? no . Have you been around anyone who is awaiting Covid 19 test results in the past 7 to 10 days? no . Have you been around anyone who has been exposed to Covid 19, or has mentioned symptoms of Covid 19 within the past 7 to 10 days? no    I reviewed procedure/mask/visitor,Covid-19 screening questions with patient, she verbalized understanding, thanked me for call.

## 2019-01-12 NOTE — Telephone Encounter (Signed)
Notified patient of message from Dr. Gherghe, patient expressed understanding and agreement. No further questions.  

## 2019-01-14 ENCOUNTER — Other Ambulatory Visit: Payer: Self-pay | Admitting: Internal Medicine

## 2019-01-14 NOTE — H&P (Signed)
   Abnormal Nuclear  Atypical symptoms

## 2019-01-15 ENCOUNTER — Other Ambulatory Visit: Payer: Self-pay

## 2019-01-15 ENCOUNTER — Ambulatory Visit (HOSPITAL_COMMUNITY)
Admission: RE | Admit: 2019-01-15 | Discharge: 2019-01-15 | Disposition: A | Discharge status: Home / Self Care | Payer: Medicare HMO | Attending: Interventional Cardiology | Admitting: Interventional Cardiology

## 2019-01-15 ENCOUNTER — Encounter (HOSPITAL_COMMUNITY)
Admission: RE | Disposition: A | Discharge status: Home / Self Care | Payer: Medicare HMO | Source: Home / Self Care | Attending: Interventional Cardiology

## 2019-01-15 DIAGNOSIS — Z833 Family history of diabetes mellitus: Secondary | ICD-10-CM | POA: Diagnosis not present

## 2019-01-15 DIAGNOSIS — I429 Cardiomyopathy, unspecified: Secondary | ICD-10-CM | POA: Diagnosis not present

## 2019-01-15 DIAGNOSIS — Z8249 Family history of ischemic heart disease and other diseases of the circulatory system: Secondary | ICD-10-CM | POA: Insufficient documentation

## 2019-01-15 DIAGNOSIS — M1990 Unspecified osteoarthritis, unspecified site: Secondary | ICD-10-CM | POA: Insufficient documentation

## 2019-01-15 DIAGNOSIS — I2510 Atherosclerotic heart disease of native coronary artery without angina pectoris: Secondary | ICD-10-CM

## 2019-01-15 DIAGNOSIS — E039 Hypothyroidism, unspecified: Secondary | ICD-10-CM | POA: Diagnosis not present

## 2019-01-15 DIAGNOSIS — Z87891 Personal history of nicotine dependence: Secondary | ICD-10-CM | POA: Diagnosis not present

## 2019-01-15 DIAGNOSIS — F419 Anxiety disorder, unspecified: Secondary | ICD-10-CM | POA: Diagnosis not present

## 2019-01-15 DIAGNOSIS — I447 Left bundle-branch block, unspecified: Secondary | ICD-10-CM | POA: Insufficient documentation

## 2019-01-15 DIAGNOSIS — J45909 Unspecified asthma, uncomplicated: Secondary | ICD-10-CM | POA: Insufficient documentation

## 2019-01-15 DIAGNOSIS — K219 Gastro-esophageal reflux disease without esophagitis: Secondary | ICD-10-CM | POA: Diagnosis not present

## 2019-01-15 DIAGNOSIS — R079 Chest pain, unspecified: Secondary | ICD-10-CM | POA: Diagnosis present

## 2019-01-15 DIAGNOSIS — I420 Dilated cardiomyopathy: Secondary | ICD-10-CM | POA: Diagnosis not present

## 2019-01-15 DIAGNOSIS — I10 Essential (primary) hypertension: Secondary | ICD-10-CM | POA: Diagnosis present

## 2019-01-15 DIAGNOSIS — R69 Illness, unspecified: Secondary | ICD-10-CM | POA: Diagnosis not present

## 2019-01-15 HISTORY — PX: RIGHT/LEFT HEART CATH AND CORONARY ANGIOGRAPHY: CATH118266

## 2019-01-15 LAB — POCT I-STAT EG7
Acid-Base Excess: 3 mmol/L — ABNORMAL HIGH (ref 0.0–2.0)
Bicarbonate: 29.6 mmol/L — ABNORMAL HIGH (ref 20.0–28.0)
Calcium, Ion: 1.16 mmol/L (ref 1.15–1.40)
HCT: 35 % — ABNORMAL LOW (ref 36.0–46.0)
Hemoglobin: 11.9 g/dL — ABNORMAL LOW (ref 12.0–15.0)
O2 Saturation: 76 %
Potassium: 3.8 mmol/L (ref 3.5–5.1)
Sodium: 141 mmol/L (ref 135–145)
TCO2: 31 mmol/L (ref 22–32)
pCO2, Ven: 52.5 mmHg (ref 44.0–60.0)
pH, Ven: 7.359 (ref 7.250–7.430)
pO2, Ven: 43 mmHg (ref 32.0–45.0)

## 2019-01-15 LAB — POCT I-STAT 7, (LYTES, BLD GAS, ICA,H+H)
Acid-Base Excess: 1 mmol/L (ref 0.0–2.0)
Bicarbonate: 26.9 mmol/L (ref 20.0–28.0)
Calcium, Ion: 1.17 mmol/L (ref 1.15–1.40)
HCT: 36 % (ref 36.0–46.0)
Hemoglobin: 12.2 g/dL (ref 12.0–15.0)
O2 Saturation: 99 %
Potassium: 3.9 mmol/L (ref 3.5–5.1)
Sodium: 142 mmol/L (ref 135–145)
TCO2: 28 mmol/L (ref 22–32)
pCO2 arterial: 44.8 mmHg (ref 32.0–48.0)
pH, Arterial: 7.387 (ref 7.350–7.450)
pO2, Arterial: 122 mmHg — ABNORMAL HIGH (ref 83.0–108.0)

## 2019-01-15 SURGERY — RIGHT/LEFT HEART CATH AND CORONARY ANGIOGRAPHY
Anesthesia: LOCAL

## 2019-01-15 MED ORDER — SODIUM CHLORIDE 0.9% FLUSH: 3.0000 mL | Freq: Two times a day (BID) | INTRAVENOUS | Status: DC

## 2019-01-15 MED ORDER — SODIUM CHLORIDE 0.9 % WEIGHT BASED INFUSION: 1.0000 mL/kg/h | INTRAVENOUS | Status: DC

## 2019-01-15 MED ORDER — ONDANSETRON HCL 4 MG/2ML IJ SOLN: 4.0000 mg | Freq: Four times a day (QID) | INTRAMUSCULAR | Status: DC | PRN

## 2019-01-15 MED ORDER — SODIUM CHLORIDE 0.9 % WEIGHT BASED INFUSION
3.0000 mL/kg/h | INTRAVENOUS | Status: AC
  Administered 2019-01-15: 3 mL/kg/h via INTRAVENOUS

## 2019-01-15 MED ORDER — FENTANYL CITRATE (PF) 100 MCG/2ML IJ SOLN
INTRAMUSCULAR | Status: AC
  Filled 2019-01-15: qty 2

## 2019-01-15 MED ORDER — SODIUM CHLORIDE 0.9% FLUSH: 3.0000 mL | INTRAVENOUS | Status: DC | PRN

## 2019-01-15 MED ORDER — FENTANYL CITRATE (PF) 100 MCG/2ML IJ SOLN
INTRAMUSCULAR | Status: DC | PRN
  Administered 2019-01-15: 25 ug via INTRAVENOUS

## 2019-01-15 MED ORDER — HEPARIN (PORCINE) IN NACL 1000-0.9 UT/500ML-% IV SOLN
INTRAVENOUS | Status: AC
  Filled 2019-01-15: qty 1000

## 2019-01-15 MED ORDER — MIDAZOLAM HCL 2 MG/2ML IJ SOLN
INTRAMUSCULAR | Status: AC
  Filled 2019-01-15: qty 2

## 2019-01-15 MED ORDER — VERAPAMIL HCL 2.5 MG/ML IV SOLN
INTRAVENOUS | Status: DC | PRN
  Administered 2019-01-15: 10 mL via INTRA_ARTERIAL

## 2019-01-15 MED ORDER — IOHEXOL 350 MG/ML SOLN
INTRAVENOUS | Status: DC | PRN
  Administered 2019-01-15: 60 mL

## 2019-01-15 MED ORDER — ASPIRIN 81 MG PO CHEW: 81.0000 mg | CHEWABLE_TABLET | ORAL | Status: DC

## 2019-01-15 MED ORDER — MIDAZOLAM HCL 2 MG/2ML IJ SOLN
INTRAMUSCULAR | Status: DC | PRN
  Administered 2019-01-15: 1 mg via INTRAVENOUS

## 2019-01-15 MED ORDER — OXYCODONE HCL 5 MG PO TABS: 5.0000 mg | ORAL_TABLET | ORAL | Status: DC | PRN

## 2019-01-15 MED ORDER — SODIUM CHLORIDE 0.9 % IV SOLN: 250.0000 mL | INTRAVENOUS | Status: DC | PRN

## 2019-01-15 MED ORDER — SODIUM CHLORIDE 0.9 % IV SOLN: INTRAVENOUS | Status: DC

## 2019-01-15 MED ORDER — HEPARIN (PORCINE) IN NACL 1000-0.9 UT/500ML-% IV SOLN
INTRAVENOUS | Status: DC | PRN
  Administered 2019-01-15 (×2): 500 mL

## 2019-01-15 MED ORDER — ACETAMINOPHEN 325 MG PO TABS: 650.0000 mg | ORAL_TABLET | ORAL | Status: DC | PRN

## 2019-01-15 MED ORDER — HEPARIN SODIUM (PORCINE) 1000 UNIT/ML IJ SOLN
INTRAMUSCULAR | Status: AC
  Filled 2019-01-15: qty 1

## 2019-01-15 MED ORDER — VERAPAMIL HCL 2.5 MG/ML IV SOLN
INTRAVENOUS | Status: AC
  Filled 2019-01-15: qty 2

## 2019-01-15 MED ORDER — HEPARIN SODIUM (PORCINE) 1000 UNIT/ML IJ SOLN
INTRAMUSCULAR | Status: DC | PRN
  Administered 2019-01-15: 4000 [IU] via INTRAVENOUS

## 2019-01-15 MED ORDER — LABETALOL HCL 5 MG/ML IV SOLN: 10.0000 mg | INTRAVENOUS | Status: DC | PRN

## 2019-01-15 MED ORDER — HYDRALAZINE HCL 20 MG/ML IJ SOLN: 10.0000 mg | INTRAMUSCULAR | Status: DC | PRN

## 2019-01-15 MED ORDER — LIDOCAINE HCL (PF) 1 % IJ SOLN
INTRAMUSCULAR | Status: AC
  Filled 2019-01-15: qty 30

## 2019-01-15 SURGICAL SUPPLY — 13 items
CATH 5FR JL3.5 JR4 ANG PIG MP (CATHETERS) ×1 IMPLANT
CATH BALLN WEDGE 5F 110CM (CATHETERS) ×1 IMPLANT
CATH INFINITI 5FR JL4 (CATHETERS) ×1 IMPLANT
DEVICE RAD COMP TR BAND LRG (VASCULAR PRODUCTS) ×1 IMPLANT
GLIDESHEATH SLEND A-KIT 6F 22G (SHEATH) ×1 IMPLANT
GUIDEWIRE INQWIRE 1.5J.035X260 (WIRE) IMPLANT
INQWIRE 1.5J .035X260CM (WIRE) ×2
KIT HEART LEFT (KITS) ×2 IMPLANT
PACK CARDIAC CATHETERIZATION (CUSTOM PROCEDURE TRAY) ×2 IMPLANT
SHEATH GLIDE SLENDER 4/5FR (SHEATH) ×1 IMPLANT
TRANSDUCER W/STOPCOCK (MISCELLANEOUS) ×2 IMPLANT
TUBING CIL FLEX 10 FLL-RA (TUBING) ×2 IMPLANT
WIRE EMERALD 3MM-J .035X260CM (WIRE) IMPLANT

## 2019-01-15 NOTE — Interval H&P Note (Signed)
Cath Lab Visit (complete for each Cath Lab visit)  Clinical Evaluation Leading to the Procedure:   ACS: No.  Non-ACS:    Anginal Classification: CCS III  Anti-ischemic medical therapy: Minimal Therapy (1 class of medications)  Non-Invasive Test Results: Intermediate-risk stress test findings: cardiac mortality 1-3%/year  Prior CABG: No previous CABG      History and Physical Interval Note:  01/15/2019 9:41 AM  [name] [name]  has presented today for surgery, with the diagnosis of cardiomyopathy.  The various methods of treatment have been discussed with the patient and family. After consideration of risks, benefits and other options for treatment, the patient has consented to  Procedure(s): RIGHT/LEFT HEART CATH AND CORONARY ANGIOGRAPHY (N/A) as a surgical intervention.  The patient's history has been reviewed, patient examined, no change in status, stable for surgery.  I have reviewed the patient's chart and labs.  Questions were answered to the patient's satisfaction.     Belva Crome III

## 2019-01-15 NOTE — CV Procedure (Signed)
   Left and right heart catheterization with coronary angiography performed via the right antecubital vein and right radial approach.  Real-time vascular ultrasound used for arterial access.  Normal coronary arteries.  Normal right heart pressures.  Normal left ventricular hemodynamics.  EF is reduced and estimated to be 30 to 40%.  Global hypokinesis is noted with dyssynergy.  LVEDP 11 mmHg.

## 2019-01-15 NOTE — Discharge Instructions (Signed)
Radial Site Care  This sheet gives you information about how to care for yourself after your procedure. Your health care provider may also give you more specific instructions. If you have problems or questions, contact your health care provider. What can I expect after the procedure? After the procedure, it is common to have:  Bruising and tenderness at the catheter insertion area. Follow these instructions at home: Medicines  Take over-the-counter and prescription medicines only as told by your health care provider. Insertion site care  Follow instructions from your health care provider about how to take care of your insertion site. Make sure you: ? Wash your hands with soap and water before you change your bandage (dressing). If soap and water are not available, use hand sanitizer. ? Change your dressing as told by your health care provider. ? Leave stitches (sutures), skin glue, or adhesive strips in place. These skin closures may need to stay in place for 2 weeks or longer. If adhesive strip edges start to loosen and curl up, you may trim the loose edges. Do not remove adhesive strips completely unless your health care provider tells you to do that.  Check your insertion site every day for signs of infection. Check for: ? Redness, swelling, or pain. ? Fluid or blood. ? Pus or a bad smell. ? Warmth.  Do not take baths, swim, or use a hot tub until your health care provider approves.  You may shower 24-48 hours after the procedure, or as directed by your health care provider. ? Remove the dressing and gently wash the site with plain soap and water. ? Pat the area dry with a clean towel. ? Do not rub the site. That could cause bleeding.  Do not apply powder or lotion to the site. Activity   For 24 hours after the procedure, or as directed by your health care provider: ? Do not flex or bend the affected arm. ? Do not push or pull heavy objects with the affected arm. ? Do not  drive yourself home from the hospital or clinic. You may drive 24 hours after the procedure unless your health care provider tells you not to. ? Do not operate machinery or power tools.  Do not lift anything that is heavier than 10 lb (4.5 kg), or the limit that you are told, until your health care provider says that it is safe.  Ask your health care provider when it is okay to: ? Return to work or school. ? Resume usual physical activities or sports. ? Resume sexual activity. General instructions  If the catheter site starts to bleed, raise your arm and put firm pressure on the site. If the bleeding does not stop, get help right away. This is a medical emergency.  If you went home on the same day as your procedure, a responsible adult should be with you for the first 24 hours after you arrive home.  Keep all follow-up visits as told by your health care provider. This is important. Contact a health care provider if:  You have a fever.  You have redness, swelling, or yellow drainage around your insertion site. Get help right away if:  You have unusual pain at the radial site.  The catheter insertion area swells very fast.  The insertion area is bleeding, and the bleeding does not stop when you hold steady pressure on the area.  Your arm or hand becomes pale, cool, tingly, or numb. These symptoms may represent a serious problem   that is an emergency. Do not wait to see if the symptoms will go away. Get medical help right away. Call your local emergency services (911 in the [place] Do not drive yourself to the hospital. Summary  After the procedure, it is common to have bruising and tenderness at the site.  Follow instructions from your health care provider about how to take care of your radial site wound. Check the wound every day for signs of infection.  Do not lift anything that is heavier than 10 lb (4.5 kg), or the limit that you are told, until your health care provider says  that it is safe. This information is not intended to replace advice given to you by your health care provider. Make sure you discuss any questions you have with your health care provider. Document Revised: 01/26/2017 Document Reviewed: 01/26/2017 Elsevier Patient Education  2020 [employer]  

## 2019-01-30 IMAGING — US US THYROID
1 series · 14 of 25 positions shown · non-contrast
Comparison: 06/29/2016: Medicine scan

CLINICAL DATA: Thyroid cancer, status post left thyroidectomy

EXAM:
THYROID ULTRASOUND
TECHNIQUE: Ultrasound examination of the thyroid gland and adjacent soft
tissues was performed.

[Series 1: us thyroid · 0.06mm/px · 14 of 54 slices shown]
[im 1/54]
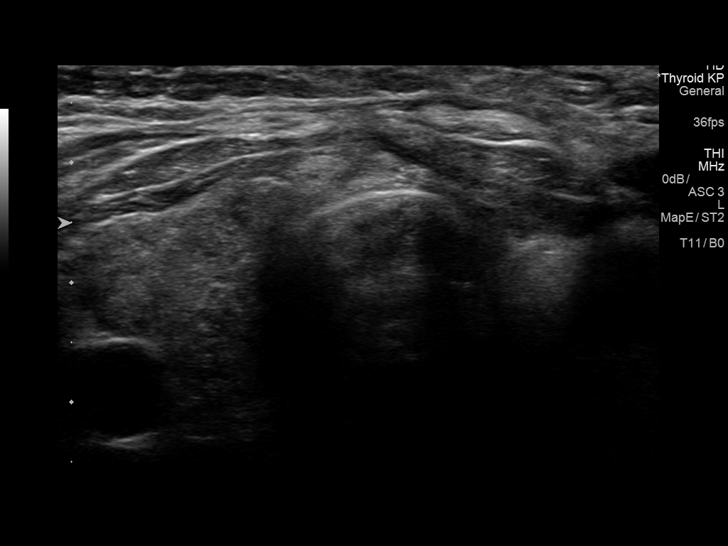
[im 5/54]
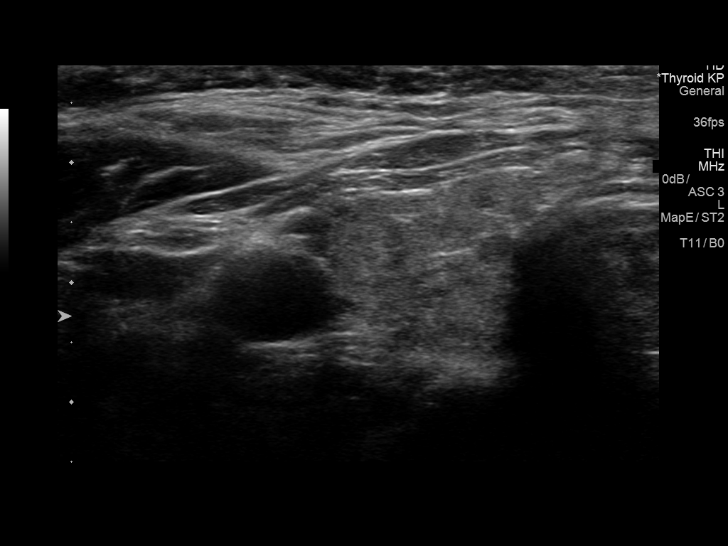
[im 9/54]
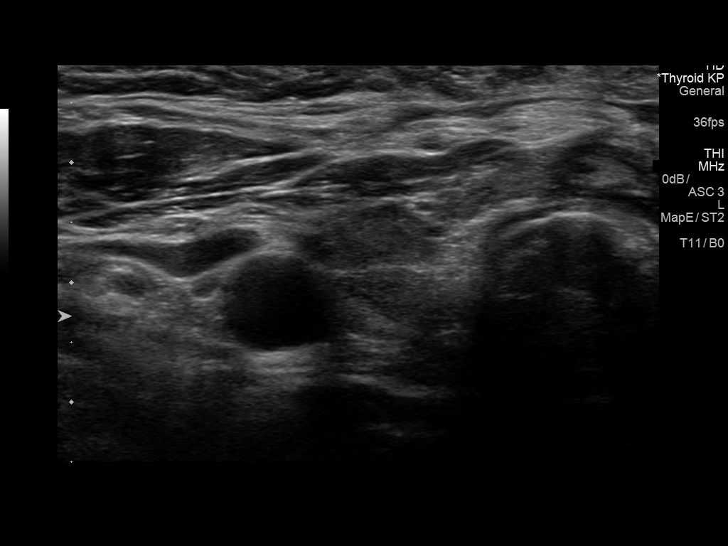
[im 14/54]
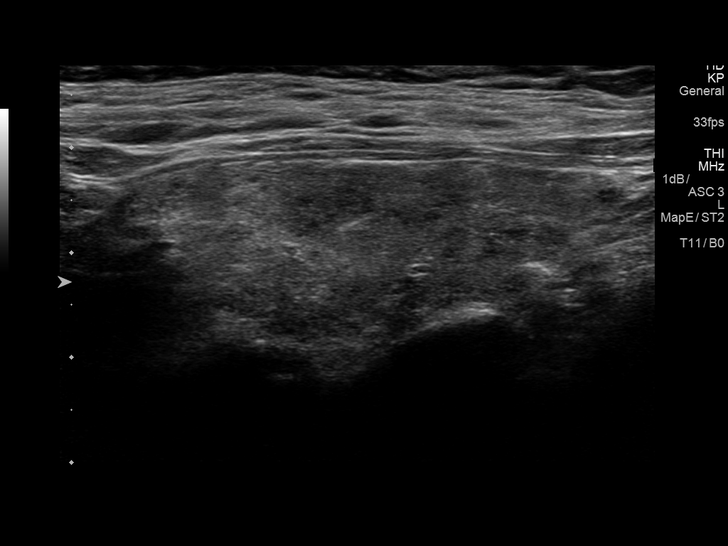
[im 18/54]
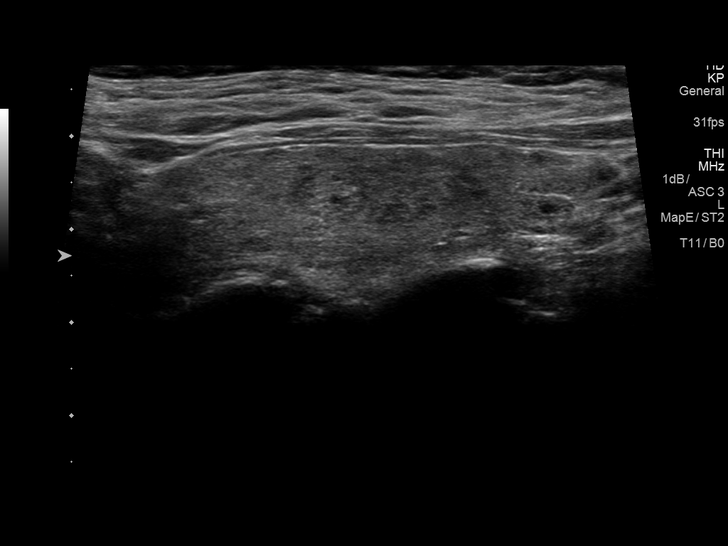
[im 20/54]
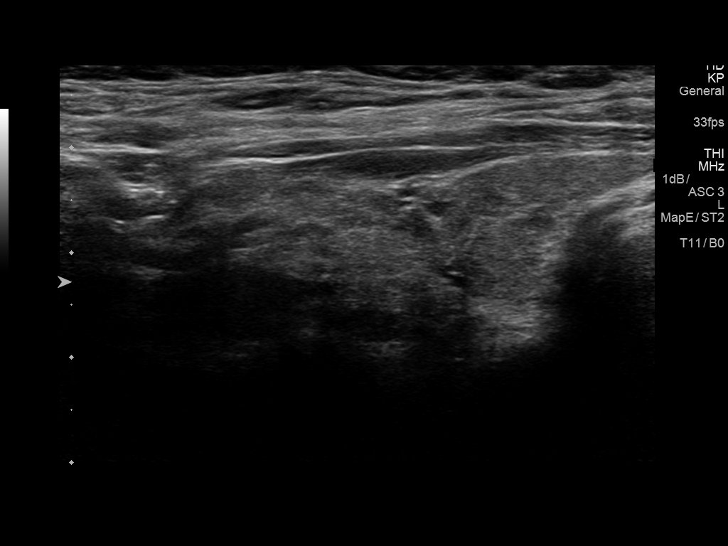
[im 25/54]
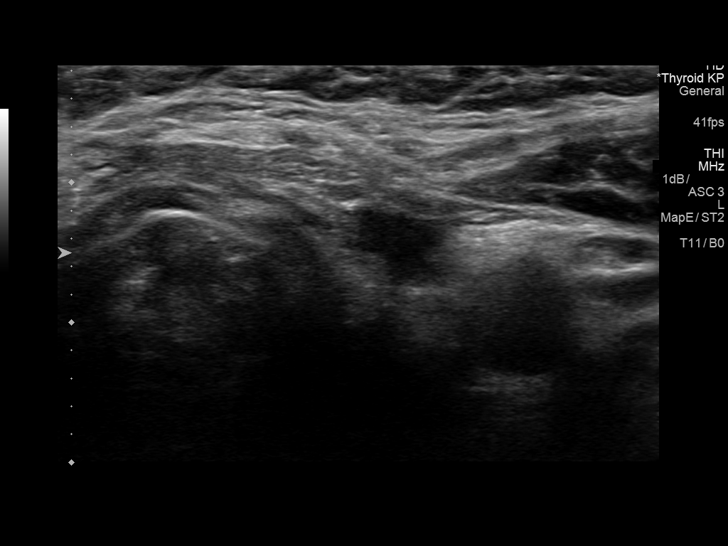
[im 29/54]
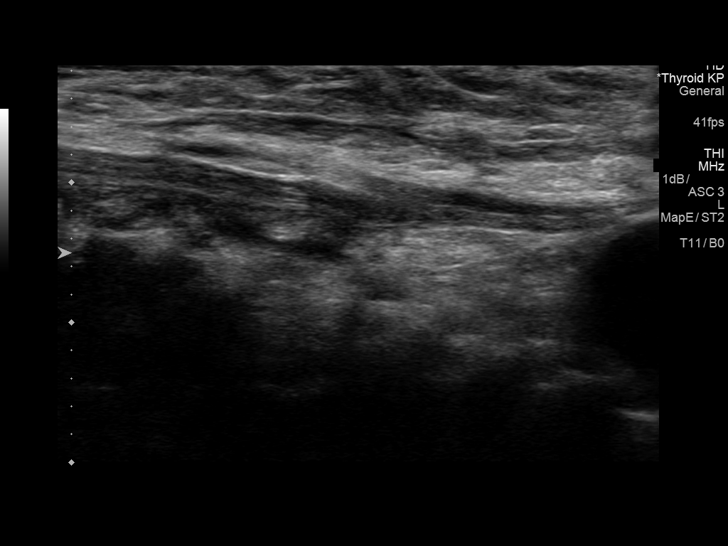
[im 34/54]
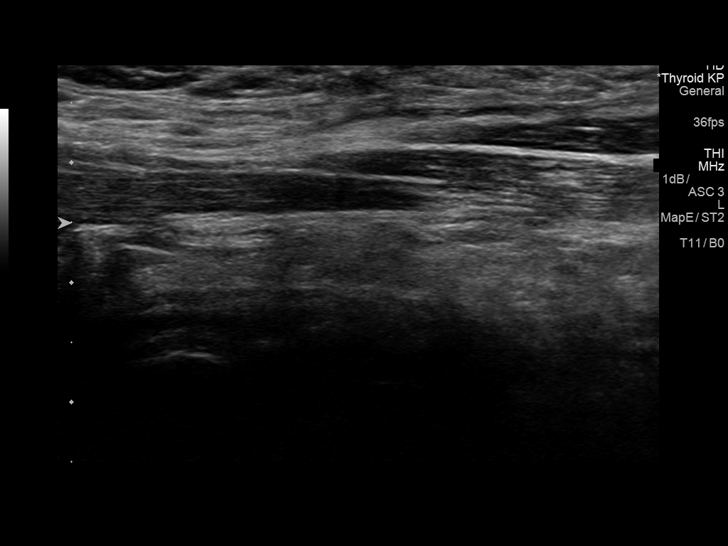
[im 36/54]
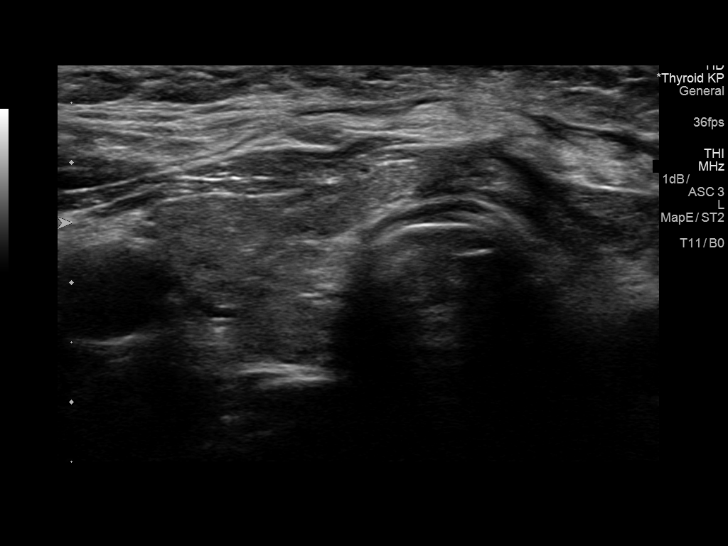
[im 40/54]
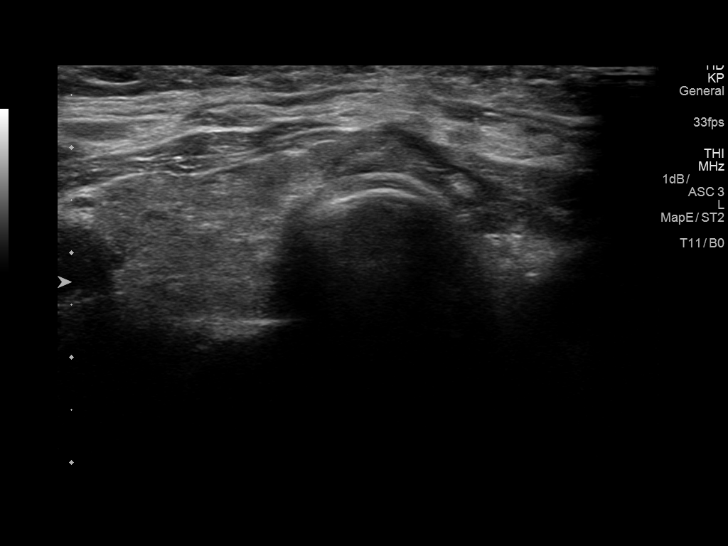
[im 45/54]
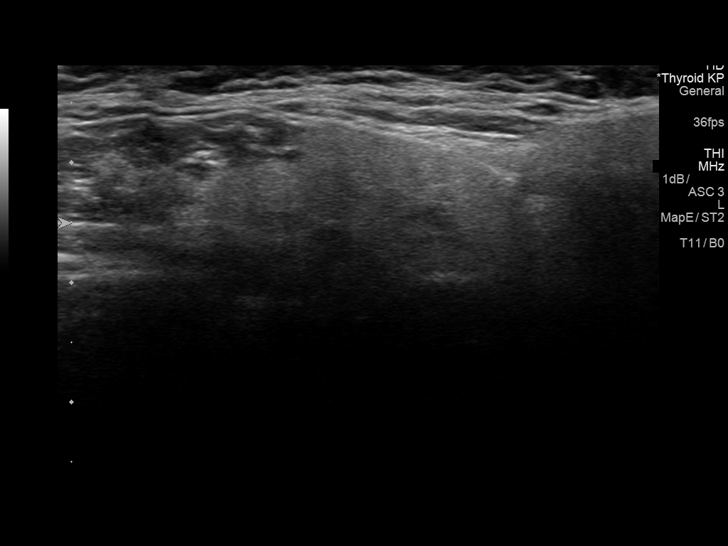
[im 49/54]
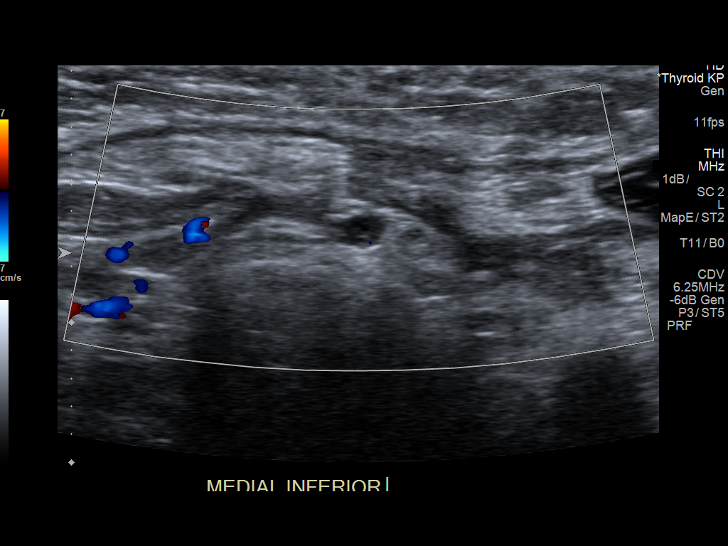
[im 54/54]
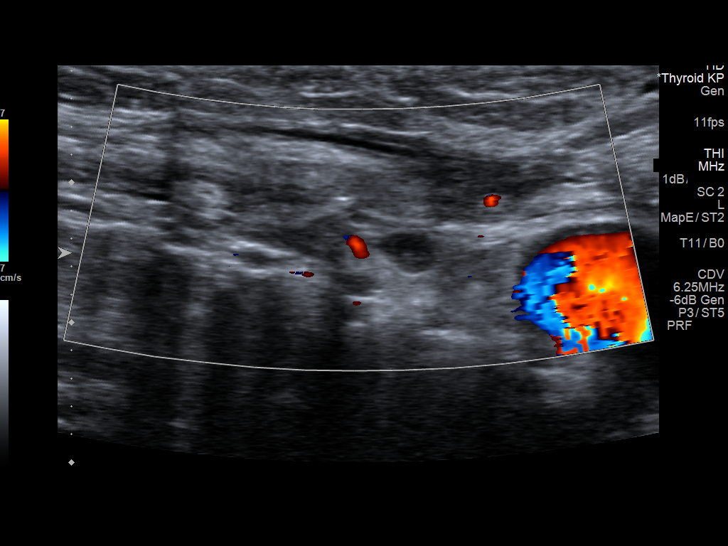

[14 of 25 positions shown; findings below may reference images not displayed]

FINDINGS: Parenchymal Echotexture: Moderately heterogenous

Isthmus: 3 mm

Right lobe: 4.8 x 1.8 x 1.8 cm

Left lobe: Surgically absent

_________________________________________________________

Estimated total number of nodules >/= 1 cm: 0

Number of spongiform nodules >/=  2 cm not described below (TR1): 0

Number of mixed cystic and solid nodules >/= 1.5 cm not described
below (TR2): 0

_________________________________________________________

Mildly heterogeneous right thyroid lobe without discrete measurable
nodule or mass. Normal vascularity. Patient is status post left
thyroidectomy. Small lymph node noted inferior to the left
thyroidectomy bed.
IMPRESSION: Remote left thyroidectomy.

Mildly heterogeneous right thyroid lobe without discrete measurable
nodule or mass.

No adenopathy.

The above is in keeping with the ACR TI-RADS recommendations - [HOSPITAL] 4776;[DATE].

## 2019-01-31 ENCOUNTER — Ambulatory Visit (INDEPENDENT_AMBULATORY_CARE_PROVIDER_SITE_OTHER): Payer: Medicare HMO | Admitting: Internal Medicine

## 2019-01-31 ENCOUNTER — Encounter: Payer: Self-pay | Admitting: Internal Medicine

## 2019-01-31 ENCOUNTER — Other Ambulatory Visit: Payer: Self-pay

## 2019-01-31 VITALS — BP 125/77 | HR 81 | Temp 97.8°F | Ht 64.5 in | Wt 174.4 lb

## 2019-01-31 DIAGNOSIS — I459 Conduction disorder, unspecified: Secondary | ICD-10-CM | POA: Diagnosis not present

## 2019-01-31 DIAGNOSIS — I442 Atrioventricular block, complete: Secondary | ICD-10-CM

## 2019-01-31 NOTE — Progress Notes (Signed)
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 She has not had any additional passing out spells. She has done well. No chest pain or sob. She notes some tingling in her feet at nightime. No edema. She does not a DM diagnosis. Allergies  Allergen Reactions  . Guaifenesin & Derivatives Nausea And Vomiting    Tremors, anxiety  . Penicillins     Pt states it does not work Did it involve swelling of the face/tongue/throat, SOB, or low BP? No Did it involve sudden or severe rash/hives, skin peeling, or any reaction on the inside of your mouth or nose? No Did you need to seek medical attention at a hospital or doctor's office? No When did it last happen?5 years If all above answers are "NO", may proceed with cephalosporin use.   . Sulfamethoxazole Other (See Comments)    Pt states it doesn't work and she doesn't want to take it      Current Outpatient Medications  Medication Sig Dispense Refill  . aspirin EC 81 MG tablet Take 1 tablet (81 mg total) by mouth daily. 30 tablet 3  . Carboxymethylcellulose Sodium (THERATEARS OP) Place 1 drop into both eyes 2 (two) times daily.     . cetirizine (ZYRTEC) 10 MG tablet Take 10 mg by mouth daily.     . Cholecalciferol (DIALYVITE VITAMIN D 5000) 125 MCG (5000 UT) capsule Take 10,000 Units by mouth 2 (two) times a week.    . diphenhydramine-acetaminophen (TYLENOL PM) 25-500 MG TABS tablet Take 1 tablet by mouth at bedtime as needed (sleep).    Marland Kitchen levothyroxine (SYNTHROID) 100 MCG tablet TAKE 1 TABLET BY MOUTH EVERY DAY 45 tablet 3  . levothyroxine (SYNTHROID) 88 MCG tablet Take 88 mcg by mouth daily before breakfast.    . minocycline (MINOCIN,DYNACIN) 100 MG capsule Take 100 mg by mouth See admin instructions. Takes 100mg  daily for 7-10 days when she has "an outbreak".    . Multiple Vitamins-Minerals (EMERGEN-C IMMUNE PO) Take 1 packet by mouth 2 (two) times a  week.     . naproxen sodium (ALEVE) 220 MG tablet Take 440 mg by mouth daily as needed (headache/pain).     Marland Kitchen omeprazole (PRILOSEC) 20 MG capsule Take 20 mg by mouth at bedtime.    . sacubitril-valsartan (ENTRESTO) 24-26 MG Take 1 tablet by mouth 2 (two) times daily. 60 tablet 6   Current Facility-Administered Medications  Medication Dose Route Frequency Provider Last Rate Last Admin  . sodium chloride flush (NS) 0.9 % injection 3 mL  3 mL Intravenous Q12H Josue Hector, MD         Past Medical History:  Diagnosis Date  . Anxiety    because of parathyroid issues  . Arthritis    hands  . Asthma    x 2, triggered by allergies  . Cancer ([place])    melanoma removed from back, thyroid  . Depression    bc of parathyroid problems  . GERD (gastroesophageal reflux disease)   . History of palpitations    none since 2016  . Pre-diabetes   . Seasonal allergies   . Varicose vein     ROS:   All systems reviewed and negative except as noted in the HPI.   Past Surgical History:  Procedure Laterality Date  . BIV PACEMAKER INSERTION CRT-P N/A 10/30/2018   Procedure: BIV PACEMAKER INSERTION CRT-P;  Surgeon: Evans Lance,  MD;  Location: [facility];  Service: Cardiovascular;  Laterality: N/A;  . CARPAL TUNNEL RELEASE Right 02/05/2014   Procedure: RIGHT CARPAL TUNNEL RELEASE;  Surgeon: Jessy Oto, MD;  Location: [facility];  Service: Orthopedics;  Laterality: Right;  . COLONOSCOPY    . EYE SURGERY     Bilateral Lasik  . LAPAROSCOPIC ASSISTED VAGINAL HYSTERECTOMY Bilateral 10/11/2013   Procedure: LAPAROSCOPIC ASSISTED VAGINAL HYSTERECTOMY BILATERAL SALPINGO OOPHORECTOMY;  Surgeon: Allena Katz, MD;  Location: [facility];  Service: Gynecology;  Laterality: Bilateral;  . Malignant melanoma resected     lower back area  . PARATHYROIDECTOMY Left 08/24/2016   Procedure: NECK EXPLORATION LEFT SUPERIOR PARATHYROIDECTOMY LEFT LOBE THYROIDECTOMY;  Surgeon: Armandina Gemma,  MD;  Location: [facility];  Service: General;  Laterality: Left;  . RIGHT/LEFT HEART CATH AND CORONARY ANGIOGRAPHY N/A 01/15/2019   Procedure: RIGHT/LEFT HEART CATH AND CORONARY ANGIOGRAPHY;  Surgeon: Belva Crome, MD;  Location: [facility];  Service: Cardiovascular;  Laterality: N/A;  . THYROIDECTOMY N/A 12/09/2016   Procedure: COMPLETION THYROIDECTOMY RIGHT LOBE;  Surgeon: Armandina Gemma, MD;  Location: [facility];  Service: General;  Laterality: N/A;  . trigger thumb surgery     right  . TUBAL LIGATION    . VARICOSE VEIN SURGERY     leg     Family History  Problem Relation Age of Onset  . Diabetes Mother 82       deceased  . Heart failure Mother   . CAD Mother   . Heart attack Father 12       deceased  . Heart attack Sister 30       deceased  . Heart failure Brother 58       deceased     Social History   Socioeconomic History  . Marital status: Married    Spouse name: Not on file  . Number of children: 2  . Years of education: Not on file  . Highest education level: Not on file  Occupational History  . Occupation: Retired  Tobacco Use  . Smoking status: Former Smoker    Packs/day: 1.00    Years: 10.00    Pack years: 10.00    Types: Cigarettes    Quit date: 01/05/1991    Years since quitting: 28.0  . Smokeless tobacco: Never Used  Substance and Sexual Activity  . Alcohol use: No  . Drug use: No  . Sexual activity: Yes    Birth control/protection: Post-menopausal, Surgical  Other Topics Concern  . Not on file  Social History Narrative  . Not on file   Social Determinants of Health   Financial Resource Strain:   . Difficulty of Paying Living Expenses: Not on file  Food Insecurity:   . Worried About Charity fundraiser in the Last Year: Not on file  . Ran Out of Food in the Last Year: Not on file  Transportation Needs:   . Lack of Transportation (Medical): Not on file  . Lack of Transportation (Non-Medical): Not on file  Physical Activity:   . Days of  Exercise per Week: Not on file  . Minutes of Exercise per Session: Not on file  Stress:   . Feeling of Stress : Not on file  Social Connections:   . Frequency of Communication with Friends and Family: Not on file  . Frequency of Social Gatherings with Friends and Family: Not on file  . Attends Religious Services: Not on file  .  Active Member of Clubs or Organizations: Not on file  . Attends Archivist Meetings: Not on file  . Marital Status: Not on file  Intimate Partner Violence:   . Fear of Current or Ex-Partner: Not on file  . Emotionally Abused: Not on file  . Physically Abused: Not on file  . Sexually Abused: Not on file     BP 125/77   Pulse 81   Temp 97.8 F (36.6 C)   Ht 5' 4.5" (1.638 m)   Wt 174 lb 6.4 oz (79.1 kg)   SpO2 99%   BMI 29.47 kg/m   Physical Exam:  Well appearing NAD HEENT: Unremarkable Neck:  No JVD, no thyromegally Lymphatics:  No adenopathy Back:  No CVA tenderness Lungs:  Clear with no wheezes HEART:  Regular rate rhythm, no murmurs, no rubs, no clicks Abd:  soft, positive bowel sounds, no organomegally, no rebound, no guarding Ext:  2 plus pulses, no edema, no cyanosis, no clubbing Skin:  No rashes no nodules Neuro:  CN II through XII intact, motor grossly intact  DEVICE  Normal device function.  See PaceArt for details.   Assess/Plan: 1. Stokes Adams attacks - she has had no recurrent symptoms since her PPM was placed.  2. LV dysfunction - she has undergone cardiac cath which demonstrated no obstructive disease 3. PPM - interrogation of her biotronik device demonstrates normal biv PPM function.  4. HTN - her bp is well controlled. She will continue her current meds.   Mikle Bosworth.D.

## 2019-01-31 NOTE — Patient Instructions (Signed)
Medication Instructions:  Your physician recommends that you continue on your current medications as directed. Please refer to the Current Medication list given to you today.  *If you need a refill on your cardiac medications before your next appointment, please call your pharmacy*  Lab Work: NONE   If you have labs (blood work) drawn today and your tests are completely normal, you will receive your results only by: . MyChart Message (if you have MyChart) OR . A paper copy in the mail If you have any lab test that is abnormal or we need to change your treatment, we will call you to review the results.  Testing/Procedures: NONE   Follow-Up: At [facility], you and your health needs are our priority.  As part of our continuing mission to provide you with exceptional heart care, we have created designated Provider Care Teams.  These Care Teams include your primary Cardiologist (physician) and Advanced Practice Providers (APPs -  Physician Assistants and Nurse Practitioners) who all work together to provide you with the care you need, when you need it.  Your next appointment:   9 months  The format for your next appointment:   In Person  Provider:   Gregg Taylor, MD  Other Instructions Thank you for choosing [facility]!    

## 2019-02-02 DIAGNOSIS — I442 Atrioventricular block, complete: Secondary | ICD-10-CM | POA: Diagnosis not present

## 2019-02-02 DIAGNOSIS — E039 Hypothyroidism, unspecified: Secondary | ICD-10-CM | POA: Diagnosis not present

## 2019-02-06 ENCOUNTER — Ambulatory Visit (INDEPENDENT_AMBULATORY_CARE_PROVIDER_SITE_OTHER): Payer: Medicare HMO | Admitting: *Deleted

## 2019-02-06 DIAGNOSIS — I420 Dilated cardiomyopathy: Secondary | ICD-10-CM | POA: Diagnosis not present

## 2019-02-06 LAB — CUP PACEART REMOTE DEVICE CHECK
Date Time Interrogation Session: 20210202073245
Implantable Lead Implant Date: 20201026
Implantable Lead Implant Date: 20201026
Implantable Lead Implant Date: 20201026
Implantable Lead Location: 753858
Implantable Lead Location: 753859
Implantable Lead Location: 753860
Implantable Lead Model: 377
Implantable Lead Model: 377
Implantable Lead Model: 401183
Implantable Lead Serial Number: 81173669
Implantable Lead Serial Number: 81216766
Implantable Lead Serial Number: 81267228
Implantable Pulse Generator Implant Date: 20201026
Pulse Gen Model: 407137
Pulse Gen Serial Number: 69580491

## 2019-02-06 NOTE — Progress Notes (Signed)
PPM Remote  

## 2019-02-23 ENCOUNTER — Encounter: Payer: Medicare Other | Admitting: Internal Medicine

## 2019-04-04 DIAGNOSIS — E039 Hypothyroidism, unspecified: Secondary | ICD-10-CM | POA: Diagnosis not present

## 2019-04-04 DIAGNOSIS — K219 Gastro-esophageal reflux disease without esophagitis: Secondary | ICD-10-CM | POA: Diagnosis not present

## 2019-04-20 ENCOUNTER — Other Ambulatory Visit: Payer: Self-pay

## 2019-04-24 ENCOUNTER — Other Ambulatory Visit: Payer: Self-pay

## 2019-04-24 ENCOUNTER — Encounter: Payer: Self-pay | Admitting: Internal Medicine

## 2019-04-24 ENCOUNTER — Ambulatory Visit: Payer: Medicare HMO | Admitting: Internal Medicine

## 2019-04-24 VITALS — BP 110/60 | HR 95 | Ht 64.5 in | Wt 172.0 lb

## 2019-04-24 DIAGNOSIS — E210 Primary hyperparathyroidism: Secondary | ICD-10-CM | POA: Diagnosis not present

## 2019-04-24 DIAGNOSIS — C73 Malignant neoplasm of thyroid gland: Secondary | ICD-10-CM | POA: Diagnosis not present

## 2019-04-24 DIAGNOSIS — E559 Vitamin D deficiency, unspecified: Secondary | ICD-10-CM

## 2019-04-24 DIAGNOSIS — E890 Postprocedural hypothyroidism: Secondary | ICD-10-CM

## 2019-04-24 LAB — T4, FREE: Free T4: 1.14 ng/dL (ref 0.60–1.60)

## 2019-04-24 LAB — VITAMIN D 25 HYDROXY (VIT D DEFICIENCY, FRACTURES): VITD: 44.74 ng/mL (ref 30.00–100.00)

## 2019-04-24 LAB — TSH: TSH: 0.51 u[IU]/mL (ref 0.35–4.50)

## 2019-04-24 NOTE — Progress Notes (Signed)
Patient ID: [name] [name], female   DOB: 08/12/1950, 69 y.o.   MRN: [id number]   This visit occurred during the SARS-CoV-2 public health emergency.  Safety protocols were in place, including screening questions prior to the visit, additional usage of staff PPE, and extensive cleaning of exam room while observing appropriate contact time as indicated for disinfecting solutions.   HPI  [name] [name] is a 69 y.o.-year-old female, initially referred by Dr. Harlow Asa, presenting for follow-up for thyroid cancer, postsurgical hypothyroidism, history of primary hyperparathyroidism (s/p parathyroidectomy) and history of vitamin D deficiency.  Last visit 6 months ago.  Reviewed and addended her thyroid cancer history: Pt. has been dx with thyroid cancer in 08/2016, after having had neck exploration, left superior parathyroidectomy, and, as the mass was observed in the left thyroid lobe during surgery, she also had left lobectomy. 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 This was 2 cm, positive surgical margins.  Diagnosis 1. Parathyroid gland, Left Superior - ENLARGED PARATHYROID TISSUE, 0.445 GRAMS. 2. Thyroid, lobectomy, Left - PAPILLARY THYROID CARCINOMA, HURTHLE CELL VARIANT, 2.0 CM. - CARCINOMA IS PRESENT AT NON ORIENTED TISSUE EDGE(S). - SEE ONCOLOGY TABLE BELOW Microscopic Comment Specimen: Left thyroid Procedure (including lymph node sampling if applicable): Hemithyroidectomy Specimen Integrity (intact/fragmented): Focally disrupted Tumor focality: Unifocal Dominant tumor: Maximum tumor size (cm): 2.0 cm (gross measurement) Tumor laterality: Left thyroid Histologic type (including subtype and/or unique features as applicable): Papillary thyroid carcinoma, Hurthle cell variant Tumor capsule: Not identified Extrathyroidal extension: Not identified Capsular invasion with degree of invasion if present: N/A Margins: Present at  non-oriented tissue edge(s). Lymphatic or vascular invasion: Not identified Lymph nodes: # examined 0; # positive; N/A Extracapsular extension (if applicable): Not identified TNM code: pT1b, pNX Non-neoplastic thyroid: Hashimoto's thyroiditis  Completion thyroidectomy on 12/09/2016: Right thyroid lobe without malignancy.  RAI tx (02/09/2017): 70.8 mCi I131  Posttreatment whole body scan (02/21/2017): Negative for metastasis: 1. Expected activity in the thyroidectomy bed. 2. No evidence of metastatic disease.  Neck ultrasound (11/18/2017): As expected after thyroidectomy.  No recurrences.  Neck ultrasound (11/16/2018): No residual thyroid tissue or pathogenically enlarged lymph nodes  Patient's thyroglobulin levels were undetectable, however, her thyroglobulin antibodies were previously elevated.  At last few checks, they were low: Lab Results  Component Value Date   THYROGLB <0.1 (L) 08/29/2018   THYROGLB <0.1 (L) 11/18/2017   THYROGLB <0.1 (L) 05/19/2017   THGAB 1 08/29/2018   THGAB <1.0 11/18/2017   THGAB 2 (H) 05/19/2017   Postsurgical hypothyroidism: -Uncontrolled  She is currently on levothyroxine 100 mcg daily, increased 12/2018.  She was on Euthyrox (as she had dizziness, headaches, heart racing, restlessness-retrospectively, these could have been caused by arrhythmia) >> changed back to levothyroxine.  She takes levothyroxine: - in am, around 6:30 AM - fasting - at least 30 min from b'fast - no Ca, Fe, MVI - + PPIs, both later in the day - not on Biotin  Review TFTs: 01/01/2019:TSH 7.16 (0.45-4.5). Lab Results  Component Value Date   TSH 4.09 10/27/2018   TSH 0.320 (L) 09/14/2018   TSH 0.24 (L) 08/29/2018   TSH 0.62 03/06/2018   TSH 0.20 (L) 01/11/2018   TSH 0.07 (L) 11/18/2017   TSH 0.10 (L) 08/15/2017   TSH 0.03 (L) 05/19/2017   TSH 30.54 (H) 02/09/2017   TSH 3.50 10/27/2016   FREET4 0.90 10/27/2018   FREET4 1.23 08/29/2018   FREET4 0.91 03/06/2018    FREET4 1.05  01/11/2018   FREET4 1.19 11/18/2017   FREET4 1.30 08/15/2017   FREET4 1.16 05/19/2017   FREET4 0.85 02/09/2017   FREET4 0.61 10/27/2016  12/21/2016: TSH 15.3  Pt denies: - feeling nodules in neck - dysphagia - choking - SOB with lying down She does have GERD and hoarseness.  She has + FH of thyroid disorders in: sister. No FH of thyroid cancer. No h/o radiation tx to head or neck other than RAI treatment.  No herbal supplements. No Biotin use. No recent steroids use.   History of primary hyperparathyroidism: - s/p resection of a parathyroid adenoma weighing 445 mg in 08/2016  Reviewed pertinent labs: Lab Results  Component Value Date   PTH 21 01/19/2017   PTH Comment 01/19/2017   CALCIUM 8.7 (L) 01/11/2019   CALCIUM 9.0 10/28/2018   CALCIUM 8.2 (L) 09/14/2018   CALCIUM 8.9 11/18/2017   CALCIUM 9.1 01/19/2017   CALCIUM 8.5 (L) 12/10/2016   CALCIUM 9.0 12/06/2016   CALCIUM 8.9 08/25/2016   CALCIUM 11.1 (H) 07/23/2014   CALCIUM 10.4 10/09/2013   Vitamin D deficiency:  She has a history of vitamin D deficiency: Lab Results  Component Value Date   VD25OH 35.36 08/29/2018   VD25OH 36.69 01/11/2018   VD25OH 28.30 (L) 11/18/2017   VD25OH 32.06 05/19/2017   VD25OH 28.28 (L) 02/09/2017   VD25OH 23.22 (L) 01/19/2017   VD25OH 20.09 (L) 10/27/2016   She was on 5000 units vitamin D once a week.at last visit, I advised her to increase to 2x a week but she forgot.  She had a syncopal episode on 09/14/2018.  She will have a pacemaker implanted 10/30/2018.  She has a 23 months old grandson.  ROS: Constitutional: no weight gain/no weight loss, no fatigue, no subjective hyperthermia, no subjective hypothermia Eyes: no blurry vision, no xerophthalmia ENT: no sore throat, + see HPI Cardiovascular: no CP/no SOB/no palpitations/no leg swelling Respiratory: no cough/no SOB/no wheezing Gastrointestinal: no N/no V/no D/no C/no acid reflux Musculoskeletal: no muscle  aches/no joint aches Skin: no rashes, no hair loss Neurological: no tremors/no numbness/no tingling/no dizziness  I reviewed pt's medications, allergies, PMH, social hx, family hx, and changes were documented in the history of present illness. Otherwise, unchanged from my initial visit note.  Past Medical History:  Diagnosis Date  . Anxiety    because of parathyroid issues  . Arthritis    hands  . Asthma    x 2, triggered by allergies  . Cancer ([place])    melanoma removed from back, thyroid  . Depression    bc of parathyroid problems  . GERD (gastroesophageal reflux disease)   . History of palpitations    none since 2016  . Pre-diabetes   . Seasonal allergies   . Varicose vein    Past Surgical History:  Procedure Laterality Date  . BIV PACEMAKER INSERTION CRT-P N/A 10/30/2018   Procedure: BIV PACEMAKER INSERTION CRT-P;  Surgeon: Evans Lance, MD;  Location: [place];  Service: Cardiovascular;  Laterality: N/A;  . CARPAL TUNNEL RELEASE Right 02/05/2014   Procedure: RIGHT CARPAL TUNNEL RELEASE;  Surgeon: Jessy Oto, MD;  Location: [place];  Service: Orthopedics;  Laterality: Right;  . COLONOSCOPY    . EYE SURGERY     Bilateral Lasik  . LAPAROSCOPIC ASSISTED VAGINAL HYSTERECTOMY Bilateral 10/11/2013   Procedure: LAPAROSCOPIC ASSISTED VAGINAL HYSTERECTOMY BILATERAL SALPINGO OOPHORECTOMY;  Surgeon: Allena Katz, MD;  Location: [facility];  Service: Gynecology;  Laterality:  Bilateral;  . Malignant melanoma resected     lower back area  . PARATHYROIDECTOMY Left 08/24/2016   Procedure: NECK EXPLORATION LEFT SUPERIOR PARATHYROIDECTOMY LEFT LOBE THYROIDECTOMY;  Surgeon: Armandina Gemma, MD;  Location: [facility];  Service: General;  Laterality: Left;  . RIGHT/LEFT HEART CATH AND CORONARY ANGIOGRAPHY N/A 01/15/2019   Procedure: RIGHT/LEFT HEART CATH AND CORONARY ANGIOGRAPHY;  Surgeon: Belva Crome, MD;  Location: [facility];  Service: Cardiovascular;   Laterality: N/A;  . THYROIDECTOMY N/A 12/09/2016   Procedure: COMPLETION THYROIDECTOMY RIGHT LOBE;  Surgeon: Armandina Gemma, MD;  Location: [facility];  Service: General;  Laterality: N/A;  . trigger thumb surgery     right  . TUBAL LIGATION    . VARICOSE VEIN SURGERY     leg   Social History   Socioeconomic History  . Marital status: Married    Spouse name: Not on file  . Number of children: 2  . Years of education: Not on file  . Highest education level: Not on file  Occupational History  . Occupation: Retired  Tobacco Use  . Smoking status: Former Smoker    Packs/day: 1.00    Years: 10.00    Pack years: 10.00    Types: Cigarettes    Quit date: 01/05/1991    Years since quitting: 28.3  . Smokeless tobacco: Never Used  Substance and Sexual Activity  . Alcohol use: No  . Drug use: No  . Sexual activity: Yes    Birth control/protection: Post-menopausal, Surgical  Other Topics Concern  . Not on file  Social History Narrative  . Not on file   Social Determinants of Health   Financial Resource Strain:   . Difficulty of Paying Living Expenses:   Food Insecurity:   . Worried About Charity fundraiser in the Last Year:   . Arboriculturist in the Last Year:   Transportation Needs:   . Film/video editor (Medical):   Marland Kitchen Lack of Transportation (Non-Medical):   Physical Activity:   . Days of Exercise per Week:   . Minutes of Exercise per Session:   Stress:   . Feeling of Stress :   Social Connections:   . Frequency of Communication with Friends and Family:   . Frequency of Social Gatherings with Friends and Family:   . Attends Religious Services:   . Active Member of Clubs or Organizations:   . Attends Archivist Meetings:   Marland Kitchen Marital Status:   Intimate Partner Violence:   . Fear of Current or Ex-Partner:   . Emotionally Abused:   Marland Kitchen Physically Abused:   . Sexually Abused:    Current Outpatient Medications on File Prior to Visit  Medication Sig Dispense  Refill  . aspirin EC 81 MG tablet Take 1 tablet (81 mg total) by mouth daily. 30 tablet 3  . Carboxymethylcellulose Sodium (THERATEARS OP) Place 1 drop into both eyes 2 (two) times daily.     . cetirizine (ZYRTEC) 10 MG tablet Take 10 mg by mouth daily.     . Cholecalciferol (DIALYVITE VITAMIN D 5000) 125 MCG (5000 UT) capsule Take 10,000 Units by mouth 2 (two) times a week.    . diphenhydramine-acetaminophen (TYLENOL PM) 25-500 MG TABS tablet Take 1 tablet by mouth at bedtime as needed (sleep).    Marland Kitchen levothyroxine (SYNTHROID) 100 MCG tablet TAKE 1 TABLET BY MOUTH EVERY DAY 45 tablet 3  . levothyroxine (SYNTHROID) 88 MCG tablet Take 88 mcg by mouth  daily before breakfast.    . minocycline (MINOCIN,DYNACIN) 100 MG capsule Take 100 mg by mouth See admin instructions. Takes '100mg'$  daily for 7-10 days when she has "an outbreak".    . Multiple Vitamins-Minerals (EMERGEN-C IMMUNE PO) Take 1 packet by mouth 2 (two) times a week.     . naproxen sodium (ALEVE) 220 MG tablet Take 440 mg by mouth daily as needed (headache/pain).     Marland Kitchen omeprazole (PRILOSEC) 20 MG capsule Take 20 mg by mouth at bedtime.    . sacubitril-valsartan (ENTRESTO) 24-26 MG Take 1 tablet by mouth 2 (two) times daily. 60 tablet 6   Current Facility-Administered Medications on File Prior to Visit  Medication Dose Route Frequency Provider Last Rate Last Admin  . sodium chloride flush (NS) 0.9 % injection 3 mL  3 mL Intravenous Q12H Josue Hector, MD       Allergies  Allergen Reactions  . Guaifenesin & Derivatives Nausea And Vomiting    Tremors, anxiety  . Penicillins     Pt states it does not work Did it involve swelling of the face/tongue/throat, SOB, or low BP? No Did it involve sudden or severe rash/hives, skin peeling, or any reaction on the inside of your mouth or nose? No Did you need to seek medical attention at a hospital or doctor's office? No When did it last happen?5 years If all above answers are "NO", may  proceed with cephalosporin use.   . Sulfamethoxazole Other (See Comments)    Pt states it doesn't work and she doesn't want to take it    Family History  Problem Relation Age of Onset  . Diabetes Mother 21       deceased  . Heart failure Mother   . CAD Mother   . Heart attack Father 86       deceased  . Heart attack Sister 43       deceased  . Heart failure Brother 68       deceased    PE: BP 110/60   Pulse 95   Ht 5' 4.5" (1.638 m)   Wt 172 lb (78 kg)   SpO2 95%   BMI 29.07 kg/m  Wt Readings from Last 3 Encounters:  04/24/19 172 lb (78 kg)  01/31/19 174 lb 6.4 oz (79.1 kg)  01/15/19 170 lb (77.1 kg)   Constitutional: overweight, in NAD Eyes: PERRLA, EOMI, no exophthalmos ENT: moist mucous membranes, no thyromegaly, no cervical lymphadenopathy Cardiovascular: RRR, No MRG Respiratory: CTA B Gastrointestinal: abdomen soft, NT, ND, BS+ Musculoskeletal: no deformities, strength intact in all 4 Skin: moist, warm, no rashes Neurological: no tremor with outstretched hands, DTR normal in all 4  ASSESSMENT: 1. Thyroid cancer - see HPI  2. Postsurgical hypothyroidism  3. H/o HPTH  4. Vitamin D def  PLAN:  1. Thyroid cancer -papillary, Hurthle cell variant -Patient has a history of incidental diagnosis of thyroid cancer after left lobectomy noticed during surgery for parathyroid adenoma.  At that time, the left lobe appeared abnormal and was resected.  She had a 2 cm PTC focus which appears to be a Hurthle cell variant.  She is stage I TNM. However, due to the fact that her the cell variant of PTC is more aggressive, I recommended completion thyroidectomy and RAI treatment afterwards.  She had completion thyroidectomy by Dr. Harlow Asa on 12/09/2017 and then RAI treatment with 70.8 mCi I-131 on 02/09/2017. -At last check, thyroglobulin levels were undetectable.  We usually follow her with  the [facility] thyroglobulin assay since she has a history of positive ATA antibodies.  We will  repeat these tests today. -We reviewed together her latest neck ultrasound from 11/16/2018: No residual thyroid tissue or abnormally enlarged lymph nodes -I will have the patient return in 6 months  2. Postsurgical hypothyroidism - latest thyroid labs reviewed with pt >> TSH was high: 01/01/2019:TSH 7.16 (0.45-4.5). - we increased her levothyroxine dose at that time to 100 mcg daily  - she continues on this dose - pt feels good on this dose, without hypothyroidism. - we discussed about taking the thyroid hormone every day, with water, >30 minutes before breakfast, separated by >4 hours from acid reflux medications, calcium, iron, multivitamins. Pt. is taking it correctly. - will check thyroid tests today: TSH and fT4 - If labs are abnormal, she will need to return for repeat TFTs in 1.5 months  3. H/o Primary HPTH -She is status post resection of a parathyroid adenoma weighing 4.45 g in 08/2016 -Her calcium normalized after the surgery but he was slightly low at last check: Lab Results  Component Value Date   CALCIUM 8.7 (L) 01/11/2019  -This could have been due to vitamin D deficiency.  We will recheck this today.  4. Vit D def -She is on 5000 units vitamin D once a week.  I advised her to increase it twice a week at last visit but she forgot. -Latest vitamin D level was low-normal last year -We we will recheck this today  Component     Latest Ref Rng & Units 04/24/2019  TSH     0.35 - 4.50 uIU/mL 0.51  T4,Free(Direct)     0.60 - 1.60 ng/dL 1.14  VITD     30.00 - 100.00 ng/mL 44.74  Thyroglobulin Antibody     0.0 - 0.9 IU/mL <1.0  Thyroglobulin by IMA     1.5 - 38.5 ng/mL <0.1 (L)   Normal thyroid tests and vitamin D. Excellent Tg.   [name], MD PhD [facility] Endocrinology

## 2019-04-24 NOTE — Patient Instructions (Addendum)
Please continue 1000  mcg daily of Levothyroxine.  Take the thyroid hormone every day, with water, at least 30 minutes before breakfast, separated by at least 4 hours from: - acid reflux medications - calcium - iron - multivitamins  Please increase Vitamin D to 5000 units 1x weekly.  Please come back for a follow-up appointment in 6 months.

## 2019-04-25 LAB — TGAB+THYROGLOBULIN IMA OR LCMS: Thyroglobulin Antibody: <1 IU/mL (ref 0.0–0.9)

## 2019-04-25 LAB — THYROGLOBULIN BY IMA: Thyroglobulin by IMA: <0.1 ng/mL — ABNORMAL LOW (ref 1.5–38.5)

## 2019-05-01 DIAGNOSIS — E210 Primary hyperparathyroidism: Secondary | ICD-10-CM | POA: Diagnosis not present

## 2019-05-01 DIAGNOSIS — R7301 Impaired fasting glucose: Secondary | ICD-10-CM | POA: Diagnosis not present

## 2019-05-01 DIAGNOSIS — E039 Hypothyroidism, unspecified: Secondary | ICD-10-CM | POA: Diagnosis not present

## 2019-05-01 DIAGNOSIS — K219 Gastro-esophageal reflux disease without esophagitis: Secondary | ICD-10-CM | POA: Diagnosis not present

## 2019-05-01 DIAGNOSIS — Z13220 Encounter for screening for lipoid disorders: Secondary | ICD-10-CM | POA: Diagnosis not present

## 2019-05-04 DIAGNOSIS — E039 Hypothyroidism, unspecified: Secondary | ICD-10-CM | POA: Diagnosis not present

## 2019-05-04 DIAGNOSIS — K219 Gastro-esophageal reflux disease without esophagitis: Secondary | ICD-10-CM | POA: Diagnosis not present

## 2019-05-08 ENCOUNTER — Ambulatory Visit (INDEPENDENT_AMBULATORY_CARE_PROVIDER_SITE_OTHER): Payer: Medicare HMO | Admitting: *Deleted

## 2019-05-08 DIAGNOSIS — I420 Dilated cardiomyopathy: Secondary | ICD-10-CM | POA: Diagnosis not present

## 2019-05-08 LAB — CUP PACEART REMOTE DEVICE CHECK
Date Time Interrogation Session: 20210504101953
Implantable Lead Implant Date: 20201026
Implantable Lead Implant Date: 20201026
Implantable Lead Implant Date: 20201026
Implantable Lead Location: 753858
Implantable Lead Location: 753859
Implantable Lead Location: 753860
Implantable Lead Model: 377
Implantable Lead Model: 377
Implantable Lead Model: 401183
Implantable Lead Serial Number: 81173669
Implantable Lead Serial Number: 81216766
Implantable Lead Serial Number: 81267228
Implantable Pulse Generator Implant Date: 20201026
Pulse Gen Model: 407137
Pulse Gen Serial Number: 69580491

## 2019-05-09 DIAGNOSIS — I519 Heart disease, unspecified: Secondary | ICD-10-CM | POA: Diagnosis not present

## 2019-05-09 DIAGNOSIS — E039 Hypothyroidism, unspecified: Secondary | ICD-10-CM | POA: Diagnosis not present

## 2019-05-09 DIAGNOSIS — Z6828 Body mass index (BMI) 28.0-28.9, adult: Secondary | ICD-10-CM | POA: Diagnosis not present

## 2019-05-09 DIAGNOSIS — J301 Allergic rhinitis due to pollen: Secondary | ICD-10-CM | POA: Diagnosis not present

## 2019-05-09 DIAGNOSIS — K219 Gastro-esophageal reflux disease without esophagitis: Secondary | ICD-10-CM | POA: Diagnosis not present

## 2019-05-09 DIAGNOSIS — C73 Malignant neoplasm of thyroid gland: Secondary | ICD-10-CM | POA: Diagnosis not present

## 2019-05-09 DIAGNOSIS — R7301 Impaired fasting glucose: Secondary | ICD-10-CM | POA: Diagnosis not present

## 2019-05-09 DIAGNOSIS — L718 Other rosacea: Secondary | ICD-10-CM | POA: Diagnosis not present

## 2019-05-09 NOTE — Progress Notes (Signed)
Remote pacemaker transmission.   

## 2019-05-14 DIAGNOSIS — Z23 Encounter for immunization: Secondary | ICD-10-CM | POA: Diagnosis not present

## 2019-05-27 ENCOUNTER — Other Ambulatory Visit: Payer: Self-pay | Admitting: Cardiovascular Disease

## 2019-06-11 DIAGNOSIS — Z23 Encounter for immunization: Secondary | ICD-10-CM | POA: Diagnosis not present

## 2019-06-19 NOTE — Progress Notes (Signed)
CARDIOLOGY CONSULT NOTE       Patient ID: [name] [name] MRN: [id number] DOB/AGE: 05/18/1950 69 y.o.  Admit date: (Not on file) Referring Physician: Nanda Quinton [facility] Primary Physician: Caryl Bis, MD Primary Cardiologist: Lajuana Ripple    Active Problems:   * No active hospital problems. *   HPI:  69 y.o. history of anxiety, asthma, hypothyroidism seen in [facility] 09/14/18 complaining of palpitations and Intermittent syncope for 3 weeks She says LOC only lasts seconds when she "awakens" she has tightness in  Her chest and palpitations but not before. Symptoms worsened when she changed from levothyroxine to thryroxine  At lower dose She had no arrhythmias on telemetry in ER ECG in ER showed SR rate 68 with new LBBB compared to 12/11/16 She had a normal stress test in 2016 CXR and Head CT in ER showed NAD Troponin negative labs ok except TSH suppressed at 0.24 a month ago and 0.32 in ER Dr Cruzita Lederer has lowered her replacement dose   2016 saw Dr Percival Spanish for palpitations family history positive for premature CAD and had palpitations As above ETT was normal cand calcium score was 0 given PRN cardizem to take  Event monitor showed high grade AV block  Echo 10/27/18 showed EF 40-45% mild MR Myovue 01/03/19 showed apical and lateral wall infarct with moderate peri infarct ischemia EF 30-44%  She had a Biotronik CRTP implanted by Dr Lovena Le on 10/30/18  Cath 01/15/19 reviewed No significant CAD Normal filling pressures PCWP 5 mmHg  She has had COVID vaccine Some leg cramping ? Restless leg at night No CHF symptoms compliant with meds  Planning trip to [facility] husband has twin sisters there     ROS All other systems reviewed and negative except as noted above  Past Medical History:  Diagnosis Date  . Anxiety    because of parathyroid issues  . Arthritis    hands  . Asthma    x 2, triggered by allergies  . Cancer ([place])    melanoma removed from back,  thyroid  . Depression    bc of parathyroid problems  . GERD (gastroesophageal reflux disease)   . History of palpitations    none since 2016  . Pre-diabetes   . Seasonal allergies   . Varicose vein     Family History  Problem Relation Age of Onset  . Diabetes Mother 5       deceased  . Heart failure Mother   . CAD Mother   . Heart attack Father 29       deceased  . Heart attack Sister 66       deceased  . Heart failure Brother 60       deceased    Social History   Socioeconomic History  . Marital status: Married    Spouse name: Not on file  . Number of children: 2  . Years of education: Not on file  . Highest education level: Not on file  Occupational History  . Occupation: Retired  Tobacco Use  . Smoking status: Former Smoker    Packs/day: 1.00    Years: 10.00    Pack years: 10.00    Types: Cigarettes    Quit date: 01/05/1991    Years since quitting: 28.4  . Smokeless tobacco: Never Used  Vaping Use  . Vaping Use: Never used  Substance and Sexual Activity  . Alcohol use: No  . Drug use: No  . Sexual activity: Yes  Birth control/protection: Post-menopausal, Surgical  Other Topics Concern  . Not on file  Social History Narrative  . Not on file   Social Determinants of Health   Financial Resource Strain:   . Difficulty of Paying Living Expenses:   Food Insecurity:   . Worried About Charity fundraiser in the Last Year:   . Arboriculturist in the Last Year:   Transportation Needs:   . Film/video editor (Medical):   Marland Kitchen Lack of Transportation (Non-Medical):   Physical Activity:   . Days of Exercise per Week:   . Minutes of Exercise per Session:   Stress:   . Feeling of Stress :   Social Connections:   . Frequency of Communication with Friends and Family:   . Frequency of Social Gatherings with Friends and Family:   . Attends Religious Services:   . Active Member of Clubs or Organizations:   . Attends Archivist Meetings:   Marland Kitchen Marital  Status:   Intimate Partner Violence:   . Fear of Current or Ex-Partner:   . Emotionally Abused:   Marland Kitchen Physically Abused:   . Sexually Abused:     Past Surgical History:  Procedure Laterality Date  . BIV PACEMAKER INSERTION CRT-P N/A 10/30/2018   Procedure: BIV PACEMAKER INSERTION CRT-P;  Surgeon: Evans Lance, MD;  Location: [place] [facility] [facility];  Service: Cardiovascular;  Laterality: N/A;  . CARPAL TUNNEL RELEASE Right 02/05/2014   Procedure: RIGHT CARPAL TUNNEL RELEASE;  Surgeon: Jessy Oto, MD;  Location: [place];  Service: Orthopedics;  Laterality: Right;  . COLONOSCOPY    . EYE SURGERY     Bilateral Lasik  . LAPAROSCOPIC ASSISTED VAGINAL HYSTERECTOMY Bilateral 10/11/2013   Procedure: LAPAROSCOPIC ASSISTED VAGINAL HYSTERECTOMY BILATERAL SALPINGO OOPHORECTOMY;  Surgeon: Allena Katz, MD;  Location: [facility];  Service: Gynecology;  Laterality: Bilateral;  . Malignant melanoma resected     lower back area  . PARATHYROIDECTOMY Left 08/24/2016   Procedure: NECK EXPLORATION LEFT SUPERIOR PARATHYROIDECTOMY LEFT LOBE THYROIDECTOMY;  Surgeon: Armandina Gemma, MD;  Location: [place] [facility];  Service: General;  Laterality: Left;  . RIGHT/LEFT HEART CATH AND CORONARY ANGIOGRAPHY N/A 01/15/2019   Procedure: RIGHT/LEFT HEART CATH AND CORONARY ANGIOGRAPHY;  Surgeon: Belva Crome, MD;  Location: [place] [place] [facility];  Service: Cardiovascular;  Laterality: N/A;  . THYROIDECTOMY N/A 12/09/2016   Procedure: COMPLETION THYROIDECTOMY RIGHT LOBE;  Surgeon: Armandina Gemma, MD;  Location[facility];  Service: General;  Laterality: N/A;  . trigger thumb surgery     right  . TUBAL LIGATION    . VARICOSE VEIN SURGERY     leg        Physical Exam: Blood pressure 118/84, pulse 70, height 5' 4.5" (1.638 m), weight 171 lb 9.6 oz (77.8 kg), SpO2 96 %.    Affect appropriate Healthy:  appears stated age 61: post thyroidectomy  Neck supple with no adenopathy JVP normal no bruits no thyromegaly Lungs  clear with no wheezing and good diaphragmatic motion Heart:  S1/S2 no murmur, no rub, gallop or click PMI normal pacer under left clavicle  Abdomen: benighn, BS positve, no tenderness, no AAA no bruit.  No HSM or HJR Distal pulses intact with no bruits No edema Neuro non-focal Skin warm and dry No muscular weakness   Labs:   Lab Results  Component Value Date   WBC 7.4 01/11/2019   HGB 11.9 (L) 01/15/2019   HCT 35.0 (L) 01/15/2019  MCV 89.4 01/11/2019   PLT 254 01/11/2019     Radiology: No results found.  EKG: P synch pacing rate 69 10/31/18    ASSESSMENT AND PLAN:   1. [place]:  LBBB with AV block post CRT PPM Taylor 10/30/18 normal device function  2. Decreased EF:  Non ischemic DCM normal filling pressures by cath continue current GDMT F/U echo October 2021 will be a year  4. Thyroid: over replacement and change in medications could contribute to palpitations f/u Dr Renne Crigler  F/U in a year   Echo October 2021 Seeing Dr Lovena Le in August   Signed: Jenkins Rouge 06/20/2019, 1:15 PM

## 2019-06-20 ENCOUNTER — Ambulatory Visit: Payer: Medicare HMO | Admitting: Cardiovascular Disease

## 2019-06-20 ENCOUNTER — Encounter: Payer: Self-pay | Admitting: Cardiovascular Disease

## 2019-06-20 ENCOUNTER — Other Ambulatory Visit: Payer: Self-pay

## 2019-06-20 VITALS — BP 118/84 | HR 70 | Ht 64.5 in | Wt 171.6 lb

## 2019-06-20 DIAGNOSIS — Z950 Presence of cardiac pacemaker: Secondary | ICD-10-CM | POA: Diagnosis not present

## 2019-06-20 DIAGNOSIS — I420 Dilated cardiomyopathy: Secondary | ICD-10-CM | POA: Diagnosis not present

## 2019-06-20 NOTE — Patient Instructions (Signed)
Medication Instructions:  Your physician recommends that you continue on your current medications as directed. Please refer to the Current Medication list given to you today.  *If you need a refill on your cardiac medications before your next appointment, please call your pharmacy*   Lab Work: None ordered  If you have labs (blood work) drawn today and your tests are completely normal, you will receive your results only by:  [place] (if you have MyChart) OR  A paper copy in the mail If you have any lab test that is abnormal or we need to change your treatment, we will call you to review the results.   Testing/Procedures: Your physician has requested that you have an echocardiogram IN October.   Echocardiography is a painless test that uses sound waves to create images of your heart. It provides your doctor with information about the size and shape of your heart and how well your hearts chambers and valves are working. This procedure takes approximately one hour. There are no restrictions for this procedure.     Follow-Up: At [facility], you and your health needs are our priority.  As part of our continuing mission to provide you with exceptional heart care, we have created designated Provider Care Teams.  These Care Teams include your primary Cardiologist (physician) and Advanced Practice Providers (APPs -  Physician Assistants and Nurse Practitioners) who all work together to provide you with the care you need, when you need it.  We recommend signing up for the patient portal called "MyChart".  Sign up information is provided on this After Visit Summary.  MyChart is used to connect with patients for Virtual Visits (Telemedicine).  Patients are able to view lab/test results, encounter notes, upcoming appointments, etc.  Non-urgent messages can be sent to your provider as well.   To learn more about what you can do with MyChart, go to [email or web address].    Your next  appointment:   6 month(s)  The format for your next appointment:   In Person  Provider:   Jenkins Rouge, MD   Other Instructions  Echocardiogram An echocardiogram is a procedure that uses painless sound waves (ultrasound) to produce an image of the heart. Images from an echocardiogram can provide important information about:  Signs of coronary artery disease (CAD).  Aneurysm detection. An aneurysm is a weak or damaged part of an artery wall that bulges out from the normal force of blood pumping through the body.  Heart size and shape. Changes in the size or shape of the heart can be associated with certain conditions, including heart failure, aneurysm, and CAD.  Heart muscle function.  Heart valve function.  Signs of a past heart attack.  Fluid buildup around the heart.  Thickening of the heart muscle.  A tumor or infectious growth around the heart valves. Tell a health care provider about:  Any allergies you have.  All medicines you are taking, including vitamins, herbs, eye drops, creams, and over-the-counter medicines.  Any blood disorders you have.  Any surgeries you have had.  Any medical conditions you have.  Whether you are pregnant or may be pregnant. What are the risks? Generally, this is a safe procedure. However, problems may occur, including:  Allergic reaction to dye (contrast) that may be used during the procedure. What happens before the procedure? No specific preparation is needed. You may eat and drink normally. What happens during the procedure?   An IV tube may be inserted into one of your  veins.  You may receive contrast through this tube. A contrast is an injection that improves the quality of the pictures from your heart.  A gel will be applied to your chest.  A wand-like tool (transducer) will be moved over your chest. The gel will help to transmit the sound waves from the transducer.  The sound waves will harmlessly bounce off of  your heart to allow the heart images to be captured in real-time motion. The images will be recorded on a computer. The procedure may vary among health care providers and hospitals. What happens after the procedure?  You may return to your normal, everyday life, including diet, activities, and medicines, unless your health care provider tells you not to do that. Summary  An echocardiogram is a procedure that uses painless sound waves (ultrasound) to produce an image of the heart.  Images from an echocardiogram can provide important information about the size and shape of your heart, heart muscle function, heart valve function, and fluid buildup around your heart.  You do not need to do anything to prepare before this procedure. You may eat and drink normally.  After the echocardiogram is completed, you may return to your normal, everyday life, unless your health care provider tells you not to do that. This information is not intended to replace advice given to you by your health care provider. Make sure you discuss any questions you have with your health care provider. Document Revised: 04/13/2018 Document Reviewed: 01/24/2016 Elsevier Patient Education  [place].

## 2019-07-04 DIAGNOSIS — K219 Gastro-esophageal reflux disease without esophagitis: Secondary | ICD-10-CM | POA: Diagnosis not present

## 2019-07-04 DIAGNOSIS — E039 Hypothyroidism, unspecified: Secondary | ICD-10-CM | POA: Diagnosis not present

## 2019-07-04 DIAGNOSIS — I442 Atrioventricular block, complete: Secondary | ICD-10-CM | POA: Diagnosis not present

## 2019-07-26 ENCOUNTER — Other Ambulatory Visit: Payer: Self-pay | Admitting: Internal Medicine

## 2019-08-03 DIAGNOSIS — C73 Malignant neoplasm of thyroid gland: Secondary | ICD-10-CM | POA: Diagnosis not present

## 2019-08-03 DIAGNOSIS — E785 Hyperlipidemia, unspecified: Secondary | ICD-10-CM | POA: Diagnosis not present

## 2019-08-03 DIAGNOSIS — Z13220 Encounter for screening for lipoid disorders: Secondary | ICD-10-CM | POA: Diagnosis not present

## 2019-08-03 DIAGNOSIS — R7301 Impaired fasting glucose: Secondary | ICD-10-CM | POA: Diagnosis not present

## 2019-08-03 DIAGNOSIS — E210 Primary hyperparathyroidism: Secondary | ICD-10-CM | POA: Diagnosis not present

## 2019-08-03 DIAGNOSIS — E039 Hypothyroidism, unspecified: Secondary | ICD-10-CM | POA: Diagnosis not present

## 2019-08-03 DIAGNOSIS — I442 Atrioventricular block, complete: Secondary | ICD-10-CM | POA: Diagnosis not present

## 2019-08-03 DIAGNOSIS — K219 Gastro-esophageal reflux disease without esophagitis: Secondary | ICD-10-CM | POA: Diagnosis not present

## 2019-08-07 ENCOUNTER — Ambulatory Visit (INDEPENDENT_AMBULATORY_CARE_PROVIDER_SITE_OTHER): Payer: Medicare HMO | Admitting: *Deleted

## 2019-08-07 DIAGNOSIS — I442 Atrioventricular block, complete: Secondary | ICD-10-CM | POA: Diagnosis not present

## 2019-08-07 DIAGNOSIS — I519 Heart disease, unspecified: Secondary | ICD-10-CM | POA: Diagnosis not present

## 2019-08-07 DIAGNOSIS — Z6828 Body mass index (BMI) 28.0-28.9, adult: Secondary | ICD-10-CM | POA: Diagnosis not present

## 2019-08-07 DIAGNOSIS — L718 Other rosacea: Secondary | ICD-10-CM | POA: Diagnosis not present

## 2019-08-07 DIAGNOSIS — R7301 Impaired fasting glucose: Secondary | ICD-10-CM | POA: Diagnosis not present

## 2019-08-07 DIAGNOSIS — Z85850 Personal history of malignant neoplasm of thyroid: Secondary | ICD-10-CM | POA: Diagnosis not present

## 2019-08-07 DIAGNOSIS — J301 Allergic rhinitis due to pollen: Secondary | ICD-10-CM | POA: Diagnosis not present

## 2019-08-07 DIAGNOSIS — K219 Gastro-esophageal reflux disease without esophagitis: Secondary | ICD-10-CM | POA: Diagnosis not present

## 2019-08-07 DIAGNOSIS — E039 Hypothyroidism, unspecified: Secondary | ICD-10-CM | POA: Diagnosis not present

## 2019-08-07 LAB — CUP PACEART REMOTE DEVICE CHECK
Date Time Interrogation Session: 20210803104559
Implantable Lead Implant Date: 20201026
Implantable Lead Implant Date: 20201026
Implantable Lead Implant Date: 20201026
Implantable Lead Location: 753858
Implantable Lead Location: 753859
Implantable Lead Location: 753860
Implantable Lead Model: 377
Implantable Lead Model: 377
Implantable Lead Model: 401183
Implantable Lead Serial Number: 81173669
Implantable Lead Serial Number: 81216766
Implantable Lead Serial Number: 81267228
Implantable Pulse Generator Implant Date: 20201026
Pulse Gen Model: 407137
Pulse Gen Serial Number: 69580491

## 2019-08-09 NOTE — Progress Notes (Signed)
Remote pacemaker transmission.   

## 2019-09-04 DIAGNOSIS — K219 Gastro-esophageal reflux disease without esophagitis: Secondary | ICD-10-CM | POA: Diagnosis not present

## 2019-09-04 DIAGNOSIS — I442 Atrioventricular block, complete: Secondary | ICD-10-CM | POA: Diagnosis not present

## 2019-09-04 DIAGNOSIS — E039 Hypothyroidism, unspecified: Secondary | ICD-10-CM | POA: Diagnosis not present

## 2019-09-19 ENCOUNTER — Telehealth: Payer: Self-pay | Admitting: Cardiovascular Disease

## 2019-09-19 NOTE — Telephone Encounter (Signed)
There is no other medicine that works like Ship broker

## 2019-09-19 NOTE — Telephone Encounter (Signed)
Spoke with pt who states that she would like to switch from Frontin to another medication that works the same. Pt states that last month it cost her $97 and this month it was $158. Please advise.

## 2019-09-19 NOTE — Telephone Encounter (Signed)
Pt would like to give update of how she feels since med changes   Please call [phone]   Thanks renee

## 2019-09-20 NOTE — Telephone Encounter (Signed)
Called pt. No answer. Left msg to call back.  

## 2019-09-24 NOTE — Telephone Encounter (Signed)
Notified pt of Dr. Kyla Balzarine response. Pt voiced understanding.

## 2019-10-01 DIAGNOSIS — R69 Illness, unspecified: Secondary | ICD-10-CM | POA: Diagnosis not present

## 2019-10-04 DIAGNOSIS — E039 Hypothyroidism, unspecified: Secondary | ICD-10-CM | POA: Diagnosis not present

## 2019-10-04 DIAGNOSIS — K219 Gastro-esophageal reflux disease without esophagitis: Secondary | ICD-10-CM | POA: Diagnosis not present

## 2019-10-04 DIAGNOSIS — I442 Atrioventricular block, complete: Secondary | ICD-10-CM | POA: Diagnosis not present

## 2019-10-08 ENCOUNTER — Other Ambulatory Visit: Payer: Self-pay

## 2019-10-08 ENCOUNTER — Ambulatory Visit (HOSPITAL_COMMUNITY)
Admission: RE | Admit: 2019-10-08 | Discharge: 2019-10-08 | Disposition: A | Discharge status: Home / Self Care | Payer: Medicare HMO | Source: Ambulatory Visit | Attending: Cardiovascular Disease | Admitting: Cardiovascular Disease

## 2019-10-08 DIAGNOSIS — I420 Dilated cardiomyopathy: Secondary | ICD-10-CM

## 2019-10-08 LAB — ECHOCARDIOGRAM COMPLETE
Area-P 1/2: 2.91 cm2
Calc EF: 45.5 %
S' Lateral: 3.51 cm
Single Plane A2C EF: 42.8 %
Single Plane A4C EF: 44.9 %

## 2019-10-08 NOTE — Progress Notes (Signed)
*  PRELIMINARY RESULTS* Echocardiogram 2D Echocardiogram has been performed.  [name] [name] 10/08/2019, 12:20 PM

## 2019-10-24 ENCOUNTER — Ambulatory Visit: Payer: Medicare HMO | Admitting: Internal Medicine

## 2019-10-24 ENCOUNTER — Other Ambulatory Visit: Payer: Self-pay

## 2019-10-24 VITALS — BP 120/70 | HR 85 | Ht 65.5 in | Wt 175.0 lb

## 2019-10-24 DIAGNOSIS — E559 Vitamin D deficiency, unspecified: Secondary | ICD-10-CM

## 2019-10-24 DIAGNOSIS — Z23 Encounter for immunization: Secondary | ICD-10-CM

## 2019-10-24 DIAGNOSIS — E890 Postprocedural hypothyroidism: Secondary | ICD-10-CM | POA: Diagnosis not present

## 2019-10-24 DIAGNOSIS — C73 Malignant neoplasm of thyroid gland: Secondary | ICD-10-CM | POA: Diagnosis not present

## 2019-10-24 DIAGNOSIS — E210 Primary hyperparathyroidism: Secondary | ICD-10-CM

## 2019-10-24 LAB — TSH: TSH: 0.81 u[IU]/mL (ref 0.35–4.50)

## 2019-10-24 LAB — T4, FREE: Free T4: 0.94 ng/dL (ref 0.60–1.60)

## 2019-10-24 NOTE — Patient Instructions (Signed)
Please stop at the lab.  Please continue 100 mcg daily of Levothyroxine.  Take the thyroid hormone every day, with water, at least 30 minutes before breakfast, separated by at least 4 hours from: - acid reflux medications - calcium - iron - multivitamins  Please come back for a follow-up appointment in 1 year. 

## 2019-10-24 NOTE — Progress Notes (Signed)
Patient ID: MANROOP JAKUBOWICZ, female   DOB: 10-28-1950, 69 y.o.   MRN: [id number]   This visit occurred during the SARS-CoV-2 public health emergency.  Safety protocols were in place, including screening questions prior to the visit, additional usage of staff PPE, and extensive cleaning of exam room while observing appropriate contact time as indicated for disinfecting solutions.   HPI  OMAR ORREGO is a 69 y.o.-year-old female, initially referred by Dr. Harlow Asa, presenting for follow-up for thyroid cancer, postsurgical hypothyroidism, history of primary hyperparathyroidism (s/p parathyroidectomy) and history of vitamin D deficiency.  Last visit 6 months ago.  She has a h/o dCMP >> had a pacemaker placed.  Reviewed and addended thyroid cancer history: Pt. has been dx with thyroid cancer in 08/2016, after having had neck exploration, left superior parathyroidectomy, and, as the mass was observed in the left thyroid lobe during surgery, she also had left lobectomy. 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 This was 2 cm, positive surgical margins.  Diagnosis 1. Parathyroid gland, Left Superior - ENLARGED PARATHYROID TISSUE, 0.445 GRAMS. 2. Thyroid, lobectomy, Left - PAPILLARY THYROID CARCINOMA, HURTHLE CELL VARIANT, 2.0 CM. - CARCINOMA IS PRESENT AT NON ORIENTED TISSUE EDGE(S). - SEE ONCOLOGY TABLE BELOW Microscopic Comment Specimen: Left thyroid Procedure (including lymph node sampling if applicable): Hemithyroidectomy Specimen Integrity (intact/fragmented): Focally disrupted Tumor focality: Unifocal Dominant tumor: Maximum tumor size (cm): 2.0 cm (gross measurement) Tumor laterality: Left thyroid Histologic type (including subtype and/or unique features as applicable): Papillary thyroid carcinoma, Hurthle cell variant Tumor capsule: Not identified Extrathyroidal extension: Not identified Capsular invasion with degree of  invasion if present: N/A Margins: Present at non-oriented tissue edge(s). Lymphatic or vascular invasion: Not identified Lymph nodes: # examined 0; # positive; N/A Extracapsular extension (if applicable): Not identified TNM code: pT1b, pNX Non-neoplastic thyroid: Hashimoto's thyroiditis  Completion thyroidectomy on 12/09/2016: Right thyroid lobe without malignancy.  RAI tx (02/09/2017): 70.8 mCi I131  Posttreatment whole body scan (02/21/2017): Negative for metastasis: 1. Expected activity in the thyroidectomy bed. 2. No evidence of metastatic disease.  Neck ultrasound (11/18/2017): As expected after thyroidectomy.  No recurrences.  Neck ultrasound (11/16/2018): No residual thyroid tissue or pathogenically enlarged lymph nodes  Latest thyroglobulin levels were undetectable: Lab Results  Component Value Date   THYROGLB <0.1 (L) 04/24/2019   THYROGLB <0.1 (L) 08/29/2018   THYROGLB <0.1 (L) 11/18/2017   THYROGLB <0.1 (L) 05/19/2017   THGAB <1.0 04/24/2019   THGAB 1 08/29/2018   THGAB <1.0 11/18/2017   THGAB 2 (H) 05/19/2017   Postsurgical hypothyroidism: -Uncontrolled   She could not tolerate Euthyrox (dizziness, headaches, heart racing, restlessness-retrospectively, these could have been caused by arrhythmia) >> changed back to levothyroxine.  Pt is on levothyroxine 100 mcg daily, taken: - in am, around 6:30 AM - fasting - at least 30 min from b'fast - no Ca, Fe, MVI, + PPIs later in the day (at night) - not on Biotin  Reviewed TFTs: Lab Results  Component Value Date   TSH 0.51 04/24/2019   TSH 4.09 10/27/2018   TSH 0.320 (L) 09/14/2018   TSH 0.24 (L) 08/29/2018   TSH 0.62 03/06/2018   TSH 0.20 (L) 01/11/2018   TSH 0.07 (L) 11/18/2017   TSH 0.10 (L) 08/15/2017   TSH 0.03 (L) 05/19/2017   TSH 30.54 (H) 02/09/2017   FREET4 1.14 04/24/2019   FREET4 0.90 10/27/2018   FREET4 1.23 08/29/2018   FREET4 0.91 03/06/2018   FREET4 1.05  01/11/2018   FREET4 1.19 11/18/2017    FREET4 1.30 08/15/2017   FREET4 1.16 05/19/2017   FREET4 0.85 02/09/2017   FREET4 0.61 10/27/2016  01/01/2019:TSH 7.16 (0.45-4.5) 12/21/2016: TSH 15.3  Pt denies: - feeling nodules in neck - dysphagia - choking - SOB with lying down She does have GERD and hoarseness.  She has + FH of thyroid disorders in: sister. No FH of thyroid cancer. No h/o radiation tx to head or neck other than RAI treatment.  No herbal supplements. No Biotin use. No recent steroids use.   History of primary hyperparathyroidism: - s/p resection of a parathyroid adenoma weighing 445 mg in 08/2016  Reviewed pertinent labs: Lab Results  Component Value Date   PTH 21 01/19/2017   PTH Comment 01/19/2017   CALCIUM 8.7 (L) 01/11/2019   CALCIUM 9.0 10/28/2018   CALCIUM 8.2 (L) 09/14/2018   CALCIUM 8.9 11/18/2017   CALCIUM 9.1 01/19/2017   CALCIUM 8.5 (L) 12/10/2016   CALCIUM 9.0 12/06/2016   CALCIUM 8.9 08/25/2016   CALCIUM 11.1 (H) 07/23/2014   CALCIUM 10.4 10/09/2013   Vitamin D deficiency:  He has a history of vitamin D deficiency: Lab Results  Component Value Date   VD25OH 44.74 04/24/2019   VD25OH 35.36 08/29/2018   VD25OH 36.69 01/11/2018   VD25OH 28.30 (L) 11/18/2017   VD25OH 32.06 05/19/2017   VD25OH 28.28 (L) 02/09/2017   VD25OH 23.22 (L) 01/19/2017   VD25OH 20.09 (L) 10/27/2016   She was on vitamin D 5000 units once a week >> ran out few weeks ago.  She had a syncopal episode on 09/14/2018.  She will have a pacemaker implanted 10/30/2018.  She walks her dog daily >> 1 mi a day.  ROS: Constitutional: + weight gain/no weight loss, no fatigue, no subjective hyperthermia, no subjective hypothermia Eyes: no blurry vision, no xerophthalmia ENT: no sore throat, + see HPI Cardiovascular: no CP/no SOB/no palpitations/no leg swelling Respiratory: no cough/no SOB/no wheezing Gastrointestinal: no N/no V/no D/no C/no acid reflux Musculoskeletal: no muscle aches/no joint aches Skin: no  rashes, no hair loss Neurological: no tremors/no numbness/no tingling/no dizziness  I reviewed pt's medications, allergies, PMH, social hx, family hx, and changes were documented in the history of present illness. Otherwise, unchanged from my initial visit note.  Past Medical History:  Diagnosis Date   Anxiety    because of parathyroid issues   Arthritis    hands   Asthma    x 2, triggered by allergies   Cancer ([place])    melanoma removed from back, thyroid   Depression    bc of parathyroid problems   GERD (gastroesophageal reflux disease)    History of palpitations    none since 2016   Pre-diabetes    Seasonal allergies    Varicose vein    Past Surgical History:  Procedure Laterality Date   BIV PACEMAKER INSERTION CRT-P N/A 10/30/2018   Procedure: BIV PACEMAKER INSERTION CRT-P;  Surgeon: Evans Lance, MD;  Location: [place] [facility];  Service: Cardiovascular;  Laterality: N/A;   CARPAL TUNNEL RELEASE Right 02/05/2014   Procedure: RIGHT CARPAL TUNNEL RELEASE;  Surgeon: Jessy Oto, MD;  Location: [place];  Service: Orthopedics;  Laterality: Right;   COLONOSCOPY     EYE SURGERY     Bilateral Lasik   LAPAROSCOPIC ASSISTED VAGINAL HYSTERECTOMY Bilateral 10/11/2013   Procedure: LAPAROSCOPIC ASSISTED VAGINAL HYSTERECTOMY BILATERAL SALPINGO OOPHORECTOMY;  Surgeon: Shon Millet II, MD;  Location: [facility];  Service: Gynecology;  Laterality: Bilateral;   Malignant melanoma resected     lower back area   PARATHYROIDECTOMY Left 08/24/2016   Procedure: NECK EXPLORATION LEFT SUPERIOR PARATHYROIDECTOMY LEFT LOBE THYROIDECTOMY;  Surgeon: Armandina Gemma, MD;  Location: [place];  Service: General;  Laterality: Left;   RIGHT/LEFT HEART CATH AND CORONARY ANGIOGRAPHY N/A 01/15/2019   Procedure: RIGHT/LEFT HEART CATH AND CORONARY ANGIOGRAPHY;  Surgeon: Belva Crome, MD;  Location: [place] [facility];  Service: Cardiovascular;  Laterality: N/A;   THYROIDECTOMY  N/A 12/09/2016   Procedure: COMPLETION THYROIDECTOMY RIGHT LOBE;  Surgeon: Armandina Gemma, MD;  Location: [facility];  Service: General;  Laterality: N/A;   trigger thumb surgery     right   TUBAL LIGATION     VARICOSE VEIN SURGERY     leg   Social History   Socioeconomic History   Marital status: Married    Spouse name: Not on file   Number of children: 2   Years of education: Not on file   Highest education level: Not on file  Occupational History   Occupation: Retired  Tobacco Use   Smoking status: Former Smoker    Packs/day: 1.00    Years: 10.00    Pack years: 10.00    Types: Cigarettes    Quit date: 01/05/1991    Years since quitting: 28.8   Smokeless tobacco: Never Used  Vaping Use   Vaping Use: Never used  Substance and Sexual Activity   Alcohol use: No   Drug use: No   Sexual activity: Yes    Birth control/protection: Post-menopausal, Surgical  Other Topics Concern   Not on file  Social History Narrative   Not on file   Social Determinants of Health   Financial Resource Strain:    Difficulty of Paying Living Expenses: Not on file  Food Insecurity:    Worried About Charity fundraiser in the Last Year: Not on file   [employer] of Food in the Last Year: Not on file  Transportation Needs:    Lack of Transportation (Medical): Not on file   Lack of Transportation (Non-Medical): Not on file  Physical Activity:    Days of Exercise per Week: Not on file   Minutes of Exercise per Session: Not on file  Stress:    Feeling of Stress : Not on file  Social Connections:    Frequency of Communication with Friends and Family: Not on file   Frequency of Social Gatherings with Friends and Family: Not on file   Attends Religious Services: Not on file   Active Member of Clubs or Organizations: Not on file   Attends Archivist Meetings: Not on file   Marital Status: Not on file  Intimate Partner Violence:    Fear of Current or Ex-Partner:  Not on file   Emotionally Abused: Not on file   Physically Abused: Not on file   Sexually Abused: Not on file   Current Outpatient Medications on File Prior to Visit  Medication Sig Dispense Refill   Carboxymethylcellulose Sodium (THERATEARS OP) Place 1 drop into both eyes 2 (two) times daily.      cetirizine (ZYRTEC) 10 MG tablet Take 10 mg by mouth daily.      Cholecalciferol (DIALYVITE VITAMIN D 5000) 125 MCG (5000 UT) capsule Take 10,000 Units by mouth 2 (two) times a week.     diphenhydramine-acetaminophen (TYLENOL PM) 25-500 MG TABS tablet Take 1 tablet by mouth at bedtime as needed (sleep).  ENTRESTO 24-26 MG TAKE 1 TABLET BY MOUTH TWICE A DAY 60 tablet 6   levothyroxine (SYNTHROID) 100 MCG tablet TAKE 1 TABLET BY MOUTH EVERY DAY 90 tablet 1   minocycline (MINOCIN,DYNACIN) 100 MG capsule Take 100 mg by mouth See admin instructions. Takes 151m daily for 7-10 days when she has "an outbreak".     naproxen sodium (ALEVE) 220 MG tablet Take 440 mg by mouth daily as needed (headache/pain).      omeprazole (PRILOSEC) 20 MG capsule Take 20 mg by mouth at bedtime.     Current Facility-Administered Medications on File Prior to Visit  Medication Dose Route Frequency Provider Last Rate Last Admin   sodium chloride flush (NS) 0.9 % injection 3 mL  3 mL Intravenous Q12H NJosue Hector MD       Allergies  Allergen Reactions   Guaifenesin & Derivatives Nausea And Vomiting    Tremors, anxiety   Penicillins     Pt states it does not work Did it involve swelling of the face/tongue/throat, SOB, or low BP? No Did it involve sudden or severe rash/hives, skin peeling, or any reaction on the inside of your mouth or nose? No Did you need to seek medical attention at a hospital or doctor's office? No When did it last happen?5 years If all above answers are NO, may proceed with cephalosporin use.    Sulfamethoxazole Other (See Comments)    Pt states it doesn't work and she  doesn't want to take it    Family History  Problem Relation Age of Onset   Diabetes Mother [phone]      deceased   Heart failure Mother    CAD Mother    Heart attack Father 534      deceased   Heart attack Sister 434      deceased   Heart failure Brother [phone]      deceased    PE: BP 120/70 (BP Location: Left Arm, Patient Position: Sitting, Cuff Size: Normal)    Pulse 85    Ht 5' 5.5" (1.664 m)    Wt 175 lb (79.4 kg)    SpO2 96%    BMI 28.68 kg/m  Wt Readings from Last 3 Encounters:  10/24/19 175 lb (79.4 kg)  06/20/19 171 lb 9.6 oz (77.8 kg)  04/24/19 172 lb (78 kg)   Constitutional: overweight, in NAD Eyes: PERRLA, EOMI, no exophthalmos ENT: moist mucous membranes, no neck masses palpable, no cervical lymphadenopathy Cardiovascular: RRR, No MRG Respiratory: CTA B Gastrointestinal: abdomen soft, NT, ND, BS+ Musculoskeletal: no deformities, strength intact in all 4 Skin: moist, warm, no rashes Neurological: no tremor with outstretched hands, DTR normal in all 4  ASSESSMENT: 1. Thyroid cancer - see HPI  2. Postsurgical hypothyroidism  3. H/o HPTH  4. Vitamin D def  PLAN:  1. Thyroid cancer -papillary, Hurthle cell variant -Patient has a history of incidental diagnosis of thyroid cancer after left lobectomy noticed during surgery for parathyroid adenoma.  At that time, the left lobe appeared abnormal and was resected.  She had a 2 cm PTC focus which appears to be a Hurthle cell variant.  She is stage I TNM. However, due to the fact that her the cell variant of PTC is more aggressive, I recommended completion thyroidectomy and RAI treatment afterwards.  She had completion thyroidectomy by Dr. GHarlow Asaon 12/09/2017 and then RAI treatment with 70.8 mCi I-131 on 02/09/2017. -At last check, thyroglobulin levels were undetectable.  We usually follow her with the [facility] thyroglobulin assay since she has a history of positive ATA antibodies.  We will repeat these today. -Reviewed  the latest neck ultrasound report from 11/16/2018: No residual thyroid tissue or abnormally enlarged lymph nodes -I will have the patient return in 1 year  2. Postsurgical hypothyroidism - latest thyroid labs reviewed with pt >> TSH was high: 01/01/2019:TSH 7.16 (0.45-4.5). -At that time, we increased her levothyroxine dose to 100 mcg daily. - latest thyroid labs reviewed with pt >> normal: Lab Results  Component Value Date   TSH 0.51 04/24/2019   - she continues on LT4 100 mcg daily - pt feels good on this dose. - we discussed about taking the thyroid hormone every day, with water, >30 minutes before breakfast, separated by >4 hours from acid reflux medications, calcium, iron, multivitamins. Pt. is taking it correctly. - will check thyroid tests today: TSH and fT4 - If labs are abnormal, she will need to return for repeat TFTs in 1.5 months  3. H/o Primary HPTH -She is status post resection of a parathyroid adenoma weighing 4.45 g in 08/2016 -Her calcium normalized after the surgery but was slightly low at last check Lab Results  Component Value Date   CALCIUM 8.7 (L) 01/11/2019  -This could have been due to vitamin D deficiency.  We will recheck this today.  4. Vit D def -At last visit she was on 5000 units vitamin D once a week -we continued this dose, however, she tells me that she ran out the medication few weeks ago. -Latest vitamin D level was normal at 04/2019 -We will recheck this today and most likely will need to start supplementation.  We discussed why this is important.  Component     Latest Ref Rng & Units 10/24/2019  Thyroglobulin     ng/mL <0.1 (L)  Comment        TSH     0.35 - 4.50 uIU/mL 0.81  T4,Free(Direct)     0.60 - 1.60 ng/dL 0.94  Thyroglobulin Ab     < or = 1 IU/mL <1  Vitamin D, 25-Hydroxy     30.0 - 100.0 ng/mL 31.6  All results are at goal.  [name], MD PhD [facility]ocr[facility]

## 2019-10-25 ENCOUNTER — Encounter: Payer: Self-pay | Admitting: Internal Medicine

## 2019-10-25 LAB — VITAMIN D 25 HYDROXY (VIT D DEFICIENCY, FRACTURES): Vit D, 25-Hydroxy: 31.6 ng/mL (ref 30.0–100.0)

## 2019-10-25 LAB — THYROGLOBULIN ANTIBODY: Thyroglobulin Ab: <1 IU/mL (ref <=1)

## 2019-10-25 LAB — THYROGLOBULIN LEVEL: Thyroglobulin: <0.1 ng/mL — ABNORMAL LOW

## 2019-11-06 ENCOUNTER — Ambulatory Visit (INDEPENDENT_AMBULATORY_CARE_PROVIDER_SITE_OTHER): Payer: Medicare HMO

## 2019-11-06 DIAGNOSIS — I442 Atrioventricular block, complete: Secondary | ICD-10-CM | POA: Diagnosis not present

## 2019-11-06 LAB — CUP PACEART REMOTE DEVICE CHECK
Date Time Interrogation Session: 20211102092358
Implantable Lead Implant Date: 20201026
Implantable Lead Implant Date: 20201026
Implantable Lead Implant Date: 20201026
Implantable Lead Location: 753858
Implantable Lead Location: 753859
Implantable Lead Location: 753860
Implantable Lead Model: 377
Implantable Lead Model: 377
Implantable Lead Model: 401183
Implantable Lead Serial Number: 81173669
Implantable Lead Serial Number: 81216766
Implantable Lead Serial Number: 81267228
Implantable Pulse Generator Implant Date: 20201026
Pulse Gen Model: 407137
Pulse Gen Serial Number: 69580491

## 2019-11-08 NOTE — Progress Notes (Signed)
Remote pacemaker transmission.   

## 2019-12-04 DIAGNOSIS — I442 Atrioventricular block, complete: Secondary | ICD-10-CM | POA: Diagnosis not present

## 2019-12-04 DIAGNOSIS — E039 Hypothyroidism, unspecified: Secondary | ICD-10-CM | POA: Diagnosis not present

## 2019-12-07 ENCOUNTER — Other Ambulatory Visit: Payer: Self-pay

## 2019-12-07 ENCOUNTER — Ambulatory Visit: Payer: Medicare HMO | Admitting: Internal Medicine

## 2019-12-07 ENCOUNTER — Encounter: Payer: Self-pay | Admitting: Internal Medicine

## 2019-12-07 VITALS — BP 138/78 | HR 81 | Ht 64.5 in | Wt 174.6 lb

## 2019-12-07 DIAGNOSIS — R55 Syncope and collapse: Secondary | ICD-10-CM

## 2019-12-07 DIAGNOSIS — I420 Dilated cardiomyopathy: Secondary | ICD-10-CM

## 2019-12-07 DIAGNOSIS — I459 Conduction disorder, unspecified: Secondary | ICD-10-CM

## 2019-12-07 LAB — CUP PACEART INCLINIC DEVICE CHECK
Date Time Interrogation Session: 20211203110111
Implantable Lead Implant Date: 20201026
Implantable Lead Implant Date: 20201026
Implantable Lead Implant Date: 20201026
Implantable Lead Location: 753858
Implantable Lead Location: 753859
Implantable Lead Location: 753860
Implantable Lead Model: 377
Implantable Lead Model: 377
Implantable Lead Model: 401183
Implantable Lead Serial Number: 81173669
Implantable Lead Serial Number: 81216766
Implantable Lead Serial Number: 81267228
Implantable Pulse Generator Implant Date: 20201026
Lead Channel Impedance Value: 468 Ohm
Lead Channel Impedance Value: 624 Ohm
Lead Channel Impedance Value: 663 Ohm
Lead Channel Pacing Threshold Amplitude: 0.7 V
Lead Channel Pacing Threshold Amplitude: 0.8 V
Lead Channel Pacing Threshold Amplitude: 2 V
Lead Channel Pacing Threshold Pulse Width: 0.4 ms
Lead Channel Pacing Threshold Pulse Width: 0.4 ms
Lead Channel Pacing Threshold Pulse Width: 0.75 ms
Lead Channel Sensing Intrinsic Amplitude: 2 mV
Lead Channel Setting Pacing Amplitude: 1.7 V
Lead Channel Setting Pacing Amplitude: 1.8 V
Lead Channel Setting Pacing Amplitude: 3.6 V
Lead Channel Setting Pacing Pulse Width: 0.4 ms
Lead Channel Setting Pacing Pulse Width: 0.75 ms
Pulse Gen Model: 407137
Pulse Gen Serial Number: 69580491

## 2019-12-07 NOTE — Patient Instructions (Signed)
Medication Instructions:  Your physician recommends that you continue on your current medications as directed. Please refer to the Current Medication list given to you today.  *If you need a refill on your cardiac medications before your next appointment, please call your pharmacy*   Lab Work: NONE   If you have labs (blood work) drawn today and your tests are completely normal, you will receive your results only by: . MyChart Message (if you have MyChart) OR . A paper copy in the mail If you have any lab test that is abnormal or we need to change your treatment, we will call you to review the results.   Testing/Procedures: NONE    Follow-Up: At [facility], you and your health needs are our priority.  As part of our continuing mission to provide you with exceptional heart care, we have created designated Provider Care Teams.  These Care Teams include your primary Cardiologist (physician) and Advanced Practice Providers (APPs -  Physician Assistants and Nurse Practitioners) who all work together to provide you with the care you need, when you need it.  We recommend signing up for the patient portal called "MyChart".  Sign up information is provided on this After Visit Summary.  MyChart is used to connect with patients for Virtual Visits (Telemedicine).  Patients are able to view lab/test results, encounter notes, upcoming appointments, etc.  Non-urgent messages can be sent to your provider as well.   To learn more about what you can do with MyChart, go to [email or web address].    Your next appointment:   1 year(s)  The format for your next appointment:   In Person  Provider:   Gregg Taylor, MD   Other Instructions Thank you for choosing [facility]!    

## 2019-12-07 NOTE — Progress Notes (Signed)
HPI [name] [name] returns today for followup. She is a pleasant 69 yo woman with a h/o CHB, s/p PPM insertion. She has done well in the interim. No chest pain or sob and no syncope. No edema.  Allergies  Allergen Reactions  . Guaifenesin & Derivatives Nausea And Vomiting    Tremors, anxiety  . Penicillins     Pt states it does not work Did it involve swelling of the face/tongue/throat, SOB, or low BP? No Did it involve sudden or severe rash/hives, skin peeling, or any reaction on the inside of your mouth or nose? No Did you need to seek medical attention at a hospital or doctor's office? No When did it last happen?5 years If all above answers are "NO", may proceed with cephalosporin use.   . Sulfamethoxazole Other (See Comments)    Pt states it doesn't work and she doesn't want to take it      Current Outpatient Medications  Medication Sig Dispense Refill  . Carboxymethylcellulose Sodium (THERATEARS OP) Place 1 drop into both eyes 2 (two) times daily.     . cetirizine (ZYRTEC) 10 MG tablet Take 10 mg by mouth daily.     . Cholecalciferol (DIALYVITE VITAMIN D 5000) 125 MCG (5000 UT) capsule Take 10,000 Units by mouth 2 (two) times a week.    . diphenhydramine-acetaminophen (TYLENOL PM) 25-500 MG TABS tablet Take 1 tablet by mouth at bedtime as needed (sleep).    . ENTRESTO 24-26 MG TAKE 1 TABLET BY MOUTH TWICE A DAY 60 tablet 6  . levothyroxine (SYNTHROID) 100 MCG tablet TAKE 1 TABLET BY MOUTH EVERY DAY 90 tablet 1  . minocycline (MINOCIN,DYNACIN) 100 MG capsule Take 100 mg by mouth See admin instructions. Takes 100mg  daily for 7-10 days when she has "an outbreak".    . naproxen sodium (ALEVE) 220 MG tablet Take 440 mg by mouth daily as needed (headache/pain).     Marland Kitchen omeprazole (PRILOSEC) 20 MG capsule Take 20 mg by mouth at bedtime.     Current Facility-Administered Medications  Medication Dose Route Frequency Provider Last Rate Last Admin  . sodium chloride flush  (NS) 0.9 % injection 3 mL  3 mL Intravenous Q12H Josue Hector, MD         Past Medical History:  Diagnosis Date  . Anxiety    because of parathyroid issues  . Arthritis    hands  . Asthma    x 2, triggered by allergies  . Cancer ([place])    melanoma removed from back, thyroid  . Depression    bc of parathyroid problems  . GERD (gastroesophageal reflux disease)   . History of palpitations    none since 2016  . Pre-diabetes   . Seasonal allergies   . Varicose vein     ROS:   All systems reviewed and negative except as noted in the HPI.   Past Surgical History:  Procedure Laterality Date  . BIV PACEMAKER INSERTION CRT-P N/A 10/30/2018   Procedure: BIV PACEMAKER INSERTION CRT-P;  Surgeon: Evans Lance, MD;  Location: [facility];  Service: Cardiovascular;  Laterality: N/A;  . CARPAL TUNNEL RELEASE Right 02/05/2014   Procedure: RIGHT CARPAL TUNNEL RELEASE;  Surgeon: Jessy Oto, MD;  Location: [facility];  Service: Orthopedics;  Laterality: Right;  . COLONOSCOPY    . EYE SURGERY     Bilateral Lasik  . LAPAROSCOPIC ASSISTED VAGINAL HYSTERECTOMY Bilateral 10/11/2013   Procedure: LAPAROSCOPIC ASSISTED  VAGINAL HYSTERECTOMY BILATERAL SALPINGO OOPHORECTOMY;  Surgeon: Allena Katz, MD;  Location: [facility];  Service: Gynecology;  Laterality: Bilateral;  . Malignant melanoma resected     lower back area  . PARATHYROIDECTOMY Left 08/24/2016   Procedure: NECK EXPLORATION LEFT SUPERIOR PARATHYROIDECTOMY LEFT LOBE THYROIDECTOMY;  Surgeon: Armandina Gemma, MD;  Location: [facility];  Service: General;  Laterality: Left;  . RIGHT/LEFT HEART CATH AND CORONARY ANGIOGRAPHY N/A 01/15/2019   Procedure: RIGHT/LEFT HEART CATH AND CORONARY ANGIOGRAPHY;  Surgeon: Belva Crome, MD;  Location: [place] [facility];  Service: Cardiovascular;  Laterality: N/A;  . THYROIDECTOMY N/A 12/09/2016   Procedure: COMPLETION THYROIDECTOMY RIGHT LOBE;  Surgeon: Armandina Gemma, MD;  Location: [facility];  Service: General;  Laterality: N/A;  . trigger thumb surgery     right  . TUBAL LIGATION    . VARICOSE VEIN SURGERY     leg     Family History  Problem Relation Age of Onset  . Diabetes Mother 48       deceased  . Heart failure Mother   . CAD Mother   . Heart attack Father 17       deceased  . Heart attack Sister 50       deceased  . Heart failure Brother 19       deceased     Social History   Socioeconomic History  . Marital status: Married    Spouse name: Not on file  . Number of children: 2  . Years of education: Not on file  . Highest education level: Not on file  Occupational History  . Occupation: Retired  Tobacco Use  . Smoking status: Former Smoker    Packs/day: 1.00    Years: 10.00    Pack years: 10.00    Types: Cigarettes    Quit date: 01/05/1991    Years since quitting: 28.9  . Smokeless tobacco: Never Used  Vaping Use  . Vaping Use: Never used  Substance and Sexual Activity  . Alcohol use: No  . Drug use: No  . Sexual activity: Yes    Birth control/protection: Post-menopausal, Surgical  Other Topics Concern  . Not on file  Social History Narrative  . Not on file   Social Determinants of Health   Financial Resource Strain:   . Difficulty of Paying Living Expenses: Not on file  Food Insecurity:   . Worried About Charity fundraiser in the Last Year: Not on file  . Ran Out of Food in the Last Year: Not on file  Transportation Needs:   . Lack of Transportation (Medical): Not on file  . Lack of Transportation (Non-Medical): Not on file  Physical Activity:   . Days of Exercise per Week: Not on file  . Minutes of Exercise per Session: Not on file  Stress:   . Feeling of Stress : Not on file  Social Connections:   . Frequency of Communication with Friends and Family: Not on file  . Frequency of Social Gatherings with Friends and Family: Not on file  . Attends Religious Services: Not on file  . Active Member of Clubs or Organizations:  Not on file  . Attends Archivist Meetings: Not on file  . Marital Status: Not on file  Intimate Partner Violence:   . Fear of Current or Ex-Partner: Not on file  . Emotionally Abused: Not on file  . Physically Abused: Not on file  . Sexually Abused: Not on file  BP 138/78   Pulse 81   Ht 5' 4.5" (1.638 m)   Wt 174 lb 9.6 oz (79.2 kg)   SpO2 94%   BMI 29.51 kg/m   Physical Exam:  Well appearing NAD HEENT: Unremarkable Neck:  No JVD, no thyromegally Lymphatics:  No adenopathy Back:  No CVA tenderness Lungs:  Clear with no wheezes HEART:  Regular rate rhythm, no murmurs, no rubs, no clicks Abd:  soft, positive bowel sounds, no organomegally, no rebound, no guarding Ext:  2 plus pulses, no edema, no cyanosis, no clubbing Skin:  No rashes no nodules Neuro:  CN II through XII intact, motor grossly intact   DEVICE  Normal device function.  See PaceArt for details.   Assess/Plan: 1. CHB - she has no escape today. She is asymptomatic, s/p PPM insertion. 2. PPM - her Biotronik BIV ICD is working normally. 3. HTN - her bp is well controlled. She will maintain a low sodium diet. 4. LV dysfunction - she is s/p biv PPM and her EF has near normalized. Continue current meds.  Carleene Overlie Penina Reisner,MD

## 2019-12-26 DIAGNOSIS — H25813 Combined forms of age-related cataract, bilateral: Secondary | ICD-10-CM | POA: Diagnosis not present

## 2020-01-10 DIAGNOSIS — E210 Primary hyperparathyroidism: Secondary | ICD-10-CM | POA: Diagnosis not present

## 2020-01-10 DIAGNOSIS — E782 Mixed hyperlipidemia: Secondary | ICD-10-CM | POA: Diagnosis not present

## 2020-01-10 DIAGNOSIS — Z13220 Encounter for screening for lipoid disorders: Secondary | ICD-10-CM | POA: Diagnosis not present

## 2020-01-10 DIAGNOSIS — K219 Gastro-esophageal reflux disease without esophagitis: Secondary | ICD-10-CM | POA: Diagnosis not present

## 2020-01-10 DIAGNOSIS — R7301 Impaired fasting glucose: Secondary | ICD-10-CM | POA: Diagnosis not present

## 2020-01-10 DIAGNOSIS — Z85850 Personal history of malignant neoplasm of thyroid: Secondary | ICD-10-CM | POA: Diagnosis not present

## 2020-01-14 DIAGNOSIS — E039 Hypothyroidism, unspecified: Secondary | ICD-10-CM | POA: Diagnosis not present

## 2020-01-14 DIAGNOSIS — Z6829 Body mass index (BMI) 29.0-29.9, adult: Secondary | ICD-10-CM | POA: Diagnosis not present

## 2020-01-14 DIAGNOSIS — I519 Heart disease, unspecified: Secondary | ICD-10-CM | POA: Diagnosis not present

## 2020-01-14 DIAGNOSIS — Z85850 Personal history of malignant neoplasm of thyroid: Secondary | ICD-10-CM | POA: Diagnosis not present

## 2020-01-14 DIAGNOSIS — L718 Other rosacea: Secondary | ICD-10-CM | POA: Diagnosis not present

## 2020-01-14 DIAGNOSIS — Z0001 Encounter for general adult medical examination with abnormal findings: Secondary | ICD-10-CM | POA: Diagnosis not present

## 2020-01-14 DIAGNOSIS — K2100 Gastro-esophageal reflux disease with esophagitis, without bleeding: Secondary | ICD-10-CM | POA: Diagnosis not present

## 2020-01-14 DIAGNOSIS — R7301 Impaired fasting glucose: Secondary | ICD-10-CM | POA: Diagnosis not present

## 2020-01-23 DIAGNOSIS — Z23 Encounter for immunization: Secondary | ICD-10-CM | POA: Diagnosis not present

## 2020-01-24 ENCOUNTER — Other Ambulatory Visit: Payer: Self-pay | Admitting: Internal Medicine

## 2020-02-03 ENCOUNTER — Other Ambulatory Visit: Payer: Self-pay | Admitting: Cardiovascular Disease

## 2020-02-04 NOTE — Telephone Encounter (Signed)
This is a [place] pt.  °

## 2020-02-05 ENCOUNTER — Ambulatory Visit (INDEPENDENT_AMBULATORY_CARE_PROVIDER_SITE_OTHER): Payer: Medicare HMO

## 2020-02-05 DIAGNOSIS — I442 Atrioventricular block, complete: Secondary | ICD-10-CM | POA: Diagnosis not present

## 2020-02-05 LAB — CUP PACEART REMOTE DEVICE CHECK
Date Time Interrogation Session: 20220130075900
Implantable Lead Implant Date: 20201026
Implantable Lead Implant Date: 20201026
Implantable Lead Implant Date: 20201026
Implantable Lead Location: 753858
Implantable Lead Location: 753859
Implantable Lead Location: 753860
Implantable Lead Model: 377
Implantable Lead Model: 377
Implantable Lead Model: 401183
Implantable Lead Serial Number: 81173669
Implantable Lead Serial Number: 81216766
Implantable Lead Serial Number: 81267228
Implantable Pulse Generator Implant Date: 20201026
Pulse Gen Model: 407137
Pulse Gen Serial Number: 69580491

## 2020-02-13 NOTE — Progress Notes (Signed)
Remote pacemaker transmission.   

## 2020-03-03 DIAGNOSIS — I442 Atrioventricular block, complete: Secondary | ICD-10-CM | POA: Diagnosis not present

## 2020-03-03 DIAGNOSIS — K219 Gastro-esophageal reflux disease without esophagitis: Secondary | ICD-10-CM | POA: Diagnosis not present

## 2020-03-03 DIAGNOSIS — E039 Hypothyroidism, unspecified: Secondary | ICD-10-CM | POA: Diagnosis not present

## 2020-03-12 DIAGNOSIS — H2512 Age-related nuclear cataract, left eye: Secondary | ICD-10-CM | POA: Diagnosis not present

## 2020-03-12 DIAGNOSIS — H25043 Posterior subcapsular polar age-related cataract, bilateral: Secondary | ICD-10-CM | POA: Diagnosis not present

## 2020-03-12 DIAGNOSIS — H25013 Cortical age-related cataract, bilateral: Secondary | ICD-10-CM | POA: Diagnosis not present

## 2020-03-12 DIAGNOSIS — H2513 Age-related nuclear cataract, bilateral: Secondary | ICD-10-CM | POA: Diagnosis not present

## 2020-04-01 DIAGNOSIS — H25041 Posterior subcapsular polar age-related cataract, right eye: Secondary | ICD-10-CM | POA: Diagnosis not present

## 2020-04-01 DIAGNOSIS — H2511 Age-related nuclear cataract, right eye: Secondary | ICD-10-CM | POA: Diagnosis not present

## 2020-04-01 DIAGNOSIS — H25011 Cortical age-related cataract, right eye: Secondary | ICD-10-CM | POA: Diagnosis not present

## 2020-04-01 DIAGNOSIS — H2512 Age-related nuclear cataract, left eye: Secondary | ICD-10-CM | POA: Diagnosis not present

## 2020-04-02 DIAGNOSIS — I442 Atrioventricular block, complete: Secondary | ICD-10-CM | POA: Diagnosis not present

## 2020-04-02 DIAGNOSIS — K219 Gastro-esophageal reflux disease without esophagitis: Secondary | ICD-10-CM | POA: Diagnosis not present

## 2020-04-02 DIAGNOSIS — E039 Hypothyroidism, unspecified: Secondary | ICD-10-CM | POA: Diagnosis not present

## 2020-04-08 DIAGNOSIS — H2511 Age-related nuclear cataract, right eye: Secondary | ICD-10-CM | POA: Diagnosis not present

## 2020-05-03 DIAGNOSIS — K219 Gastro-esophageal reflux disease without esophagitis: Secondary | ICD-10-CM | POA: Diagnosis not present

## 2020-05-03 DIAGNOSIS — I442 Atrioventricular block, complete: Secondary | ICD-10-CM | POA: Diagnosis not present

## 2020-05-03 DIAGNOSIS — E039 Hypothyroidism, unspecified: Secondary | ICD-10-CM | POA: Diagnosis not present

## 2020-05-06 ENCOUNTER — Ambulatory Visit (INDEPENDENT_AMBULATORY_CARE_PROVIDER_SITE_OTHER): Payer: Medicare HMO

## 2020-05-06 DIAGNOSIS — I420 Dilated cardiomyopathy: Secondary | ICD-10-CM

## 2020-05-06 LAB — CUP PACEART REMOTE DEVICE CHECK
Date Time Interrogation Session: 20220503125210
Implantable Lead Implant Date: 20201026
Implantable Lead Implant Date: 20201026
Implantable Lead Implant Date: 20201026
Implantable Lead Location: 753858
Implantable Lead Location: 753859
Implantable Lead Location: 753860
Implantable Lead Model: 377
Implantable Lead Model: 377
Implantable Lead Model: 401183
Implantable Lead Serial Number: 81173669
Implantable Lead Serial Number: 81216766
Implantable Lead Serial Number: 81267228
Implantable Pulse Generator Implant Date: 20201026
Pulse Gen Model: 407137
Pulse Gen Serial Number: 69580491

## 2020-05-27 NOTE — Progress Notes (Signed)
Remote pacemaker transmission.   

## 2020-06-02 DIAGNOSIS — E039 Hypothyroidism, unspecified: Secondary | ICD-10-CM | POA: Diagnosis not present

## 2020-06-02 DIAGNOSIS — K219 Gastro-esophageal reflux disease without esophagitis: Secondary | ICD-10-CM | POA: Diagnosis not present

## 2020-06-02 DIAGNOSIS — I442 Atrioventricular block, complete: Secondary | ICD-10-CM | POA: Diagnosis not present

## 2020-06-11 NOTE — Progress Notes (Signed)
CARDIOLOGY CONSULT NOTE       Patient ID: [name] [name] MRN: [id number] DOB/AGE: 07-Jul-1950 70 y.o.  Admit date: (Not on file) Referring Physician: Nanda Quinton [facility] Primary Physician: Caryl Bis, MD Primary Cardiologist: Lajuana Ripple    Active Problems:   * No active hospital problems. *   HPI:  70 y.o. with history of anxiety, asthma, low thyroid History of palpitations with event monitor picking up high grade AV block Had Biotronik CRTP PPM implanted by DrTaylor 10/30/18 Back in 2016 had normal ETT and calcium score of 0  On 01/03/19 had normal myovue No history of CAD. EF has been low in past Most recent TTE 10/08/19 with EF 50-55% Abnormal septal motion from PPM/LBBB  Getting remote pacer checks Normal PACEART Needs f/u US Thyroid in Fall per Dr Gardenia Phlegm  Has had COVID shots    ROS All other systems reviewed and negative except as noted above  Past Medical History:  Diagnosis Date   Anxiety    because of parathyroid issues   Arthritis    hands   Asthma    x 2, triggered by allergies   Cancer ([place])    melanoma removed from back, thyroid   Depression    bc of parathyroid problems   GERD (gastroesophageal reflux disease)    History of palpitations    none since 2016   Pre-diabetes    Seasonal allergies    Varicose vein     Family History  Problem Relation Age of Onset   Diabetes Mother 39       deceased   Heart failure Mother    CAD Mother    Heart attack Father 48       deceased   Heart attack Sister 56       deceased   Heart failure Brother 24       deceased    Social History   Socioeconomic History   Marital status: Married    Spouse name: Not on file   Number of children: 2   Years of education: Not on file   Highest education level: Not on file  Occupational History   Occupation: Retired  Tobacco Use   Smoking status: Former    Packs/day: 1.00    Years: 10.00    Pack years: 10.00    Types: Cigarettes    Quit date:  01/05/1991    Years since quitting: 29.4   Smokeless tobacco: Never  Vaping Use   Vaping Use: Never used  Substance and Sexual Activity   Alcohol use: No   Drug use: No   Sexual activity: Yes    Birth control/protection: Post-menopausal, Surgical  Other Topics Concern   Not on file  Social History Narrative   Not on file   Social Determinants of Health   Financial Resource Strain: Not on file  Food Insecurity: Not on file  Transportation Needs: Not on file  Physical Activity: Not on file  Stress: Not on file  Social Connections: Not on file  Intimate Partner Violence: Not on file    Past Surgical History:  Procedure Laterality Date   BIV PACEMAKER INSERTION CRT-P N/A 10/30/2018   Procedure: BIV PACEMAKER INSERTION CRT-P;  Surgeon: Evans Lance, MD;  Location: [place] [facility];  Service: Cardiovascular;  Laterality: N/A;   CARPAL TUNNEL RELEASE Right 02/05/2014   Procedure: RIGHT CARPAL TUNNEL RELEASE;  Surgeon: Jessy Oto, MD;  Location: [facility];  Service: Orthopedics;  Laterality:  Right;   COLONOSCOPY     EYE SURGERY     Bilateral Lasik   LAPAROSCOPIC ASSISTED VAGINAL HYSTERECTOMY Bilateral 10/11/2013   Procedure: LAPAROSCOPIC ASSISTED VAGINAL HYSTERECTOMY BILATERAL SALPINGO OOPHORECTOMY;  Surgeon: Allena Katz, MD;  Location: [facility];  Service: Gynecology;  Laterality: Bilateral;   Malignant melanoma resected     lower back area   PARATHYROIDECTOMY Left 08/24/2016   Procedure: NECK EXPLORATION LEFT SUPERIOR PARATHYROIDECTOMY LEFT LOBE THYROIDECTOMY;  Surgeon: Armandina Gemma, MD;  Location: [facility];  Service: General;  Laterality: Left;   RIGHT/LEFT HEART CATH AND CORONARY ANGIOGRAPHY N/A 01/15/2019   Procedure: RIGHT/LEFT HEART CATH AND CORONARY ANGIOGRAPHY;  Surgeon: Belva Crome, MD;  Location: [facility];  Service: Cardiovascular;  Laterality: N/A;   THYROIDECTOMY N/A 12/09/2016   Procedure: COMPLETION THYROIDECTOMY RIGHT LOBE;  Surgeon:  Armandina Gemma, MD;  Location: [facility];  Service: General;  Laterality: N/A;   trigger thumb surgery     right   TUBAL LIGATION     VARICOSE VEIN SURGERY     leg        Physical Exam: There were no vitals taken for this visit.    Affect appropriate Healthy:  appears stated age 70: post thyroidectomy  Neck supple with no adenopathy JVP normal no bruits no thyromegaly Lungs clear with no wheezing and good diaphragmatic motion Heart:  S1/S2 no murmur, no rub, gallop or click PMI normal pacer under left clavicle  Abdomen: benighn, BS positve, no tenderness, no AAA no bruit.  No HSM or HJR Distal pulses intact with no bruits No edema Neuro non-focal Skin warm and dry No muscular weakness   Labs:   Lab Results  Component Value Date   WBC 7.4 01/11/2019   HGB 11.9 (L) 01/15/2019   HCT 35.0 (L) 01/15/2019   MCV 89.4 01/11/2019   PLT 254 01/11/2019     Radiology: No results found.  EKG: P synch pacing rate 69 10/31/18  06/18/2020 P synch pacing PAC    ASSESSMENT AND PLAN:   1. [place]:  LBBB with AV block post CRT PPM Taylor 10/30/18 normal device function  2. Decreased EF:  Non ischemic DCM normal filling pressures by cath continue current GDMT TTE reviewed 10/08/19 and low normal EF 50-55% 4. Thyroid: TSH now normal 10/24/19 with normal T4 as well  F/U in a year   F/U Dr Lovena Le October  then me in a year   Signed: Jenkins Rouge 06/18/2020, 10:07 AM

## 2020-06-18 ENCOUNTER — Ambulatory Visit: Payer: Medicare HMO | Admitting: Cardiovascular Disease

## 2020-06-18 ENCOUNTER — Other Ambulatory Visit: Payer: Self-pay

## 2020-06-18 VITALS — BP 122/72 | HR 80 | Ht 64.5 in | Wt 171.2 lb

## 2020-06-18 DIAGNOSIS — Z950 Presence of cardiac pacemaker: Secondary | ICD-10-CM | POA: Diagnosis not present

## 2020-06-18 DIAGNOSIS — R9430 Abnormal result of cardiovascular function study, unspecified: Secondary | ICD-10-CM | POA: Diagnosis not present

## 2020-06-18 DIAGNOSIS — C73 Malignant neoplasm of thyroid gland: Secondary | ICD-10-CM

## 2020-06-18 NOTE — Patient Instructions (Signed)
Medication Instructions:  Your physician recommends that you continue on your current medications as directed. Please refer to the Current Medication list given to you today.  *If you need a refill on your cardiac medications before your next appointment, please call your pharmacy*   Lab Work: None today  If you have labs (blood work) drawn today and your tests are completely normal, you will receive your results only by: MyChart Message (if you have MyChart) OR A paper copy in the mail If you have any lab test that is abnormal or we need to change your treatment, we will call you to review the results.   Testing/Procedures: None today    Follow-Up: At [facility], you and your health needs are our priority.  As part of our continuing mission to provide you with exceptional heart care, we have created designated Provider Care Teams.  These Care Teams include your primary Cardiologist (physician) and Advanced Practice Providers (APPs -  Physician Assistants and Nurse Practitioners) who all work together to provide you with the care you need, when you need it.  We recommend signing up for the patient portal called "MyChart".  Sign up information is provided on this After Visit Summary.  MyChart is used to connect with patients for Virtual Visits (Telemedicine).  Patients are able to view lab/test results, encounter notes, upcoming appointments, etc.  Non-urgent messages can be sent to your provider as well.   To learn more about what you can do with MyChart, go to [email or web address].    Your next appointment:   12 month(s)  The format for your next appointment:   In Person  Provider:   Peter Nishan, MD   Other Instructions None     

## 2020-07-03 DIAGNOSIS — I442 Atrioventricular block, complete: Secondary | ICD-10-CM | POA: Diagnosis not present

## 2020-07-03 DIAGNOSIS — K219 Gastro-esophageal reflux disease without esophagitis: Secondary | ICD-10-CM | POA: Diagnosis not present

## 2020-07-03 DIAGNOSIS — E039 Hypothyroidism, unspecified: Secondary | ICD-10-CM | POA: Diagnosis not present

## 2020-07-10 DIAGNOSIS — E039 Hypothyroidism, unspecified: Secondary | ICD-10-CM | POA: Diagnosis not present

## 2020-07-10 DIAGNOSIS — K219 Gastro-esophageal reflux disease without esophagitis: Secondary | ICD-10-CM | POA: Diagnosis not present

## 2020-07-10 DIAGNOSIS — Z9189 Other specified personal risk factors, not elsewhere classified: Secondary | ICD-10-CM | POA: Diagnosis not present

## 2020-07-10 DIAGNOSIS — Z13220 Encounter for screening for lipoid disorders: Secondary | ICD-10-CM | POA: Diagnosis not present

## 2020-07-10 DIAGNOSIS — R7301 Impaired fasting glucose: Secondary | ICD-10-CM | POA: Diagnosis not present

## 2020-07-15 DIAGNOSIS — J301 Allergic rhinitis due to pollen: Secondary | ICD-10-CM | POA: Diagnosis not present

## 2020-07-15 DIAGNOSIS — R7301 Impaired fasting glucose: Secondary | ICD-10-CM | POA: Diagnosis not present

## 2020-07-15 DIAGNOSIS — E039 Hypothyroidism, unspecified: Secondary | ICD-10-CM | POA: Diagnosis not present

## 2020-07-15 DIAGNOSIS — K2100 Gastro-esophageal reflux disease with esophagitis, without bleeding: Secondary | ICD-10-CM | POA: Diagnosis not present

## 2020-07-15 DIAGNOSIS — Z85850 Personal history of malignant neoplasm of thyroid: Secondary | ICD-10-CM | POA: Diagnosis not present

## 2020-07-15 DIAGNOSIS — Z7189 Other specified counseling: Secondary | ICD-10-CM | POA: Diagnosis not present

## 2020-07-15 DIAGNOSIS — I519 Heart disease, unspecified: Secondary | ICD-10-CM | POA: Diagnosis not present

## 2020-07-15 DIAGNOSIS — L718 Other rosacea: Secondary | ICD-10-CM | POA: Diagnosis not present

## 2020-07-20 ENCOUNTER — Other Ambulatory Visit: Payer: Self-pay | Admitting: Internal Medicine

## 2020-07-24 ENCOUNTER — Other Ambulatory Visit: Payer: Self-pay | Admitting: Family Medicine

## 2020-07-24 DIAGNOSIS — Z1231 Encounter for screening mammogram for malignant neoplasm of breast: Secondary | ICD-10-CM

## 2020-07-29 ENCOUNTER — Ambulatory Visit
Admission: RE | Admit: 2020-07-29 | Discharge: 2020-07-29 | Disposition: A | Discharge status: Home / Self Care | Payer: Medicare HMO | Source: Ambulatory Visit | Attending: Family Medicine | Admitting: Family Medicine

## 2020-07-29 ENCOUNTER — Other Ambulatory Visit: Payer: Self-pay

## 2020-07-29 DIAGNOSIS — Z1231 Encounter for screening mammogram for malignant neoplasm of breast: Secondary | ICD-10-CM

## 2020-08-05 ENCOUNTER — Ambulatory Visit (INDEPENDENT_AMBULATORY_CARE_PROVIDER_SITE_OTHER): Payer: Medicare HMO

## 2020-08-05 DIAGNOSIS — I420 Dilated cardiomyopathy: Secondary | ICD-10-CM

## 2020-08-05 LAB — CUP PACEART REMOTE DEVICE CHECK
Date Time Interrogation Session: 20220802091158
Implantable Lead Implant Date: 20201026
Implantable Lead Implant Date: 20201026
Implantable Lead Implant Date: 20201026
Implantable Lead Location: 753858
Implantable Lead Location: 753859
Implantable Lead Location: 753860
Implantable Lead Model: 377
Implantable Lead Model: 377
Implantable Lead Model: 401183
Implantable Lead Serial Number: 81173669
Implantable Lead Serial Number: 81216766
Implantable Lead Serial Number: 81267228
Implantable Pulse Generator Implant Date: 20201026
Pulse Gen Model: 407137
Pulse Gen Serial Number: 69580491

## 2020-08-30 NOTE — Progress Notes (Signed)
Remote pacemaker transmission.   

## 2020-09-03 DIAGNOSIS — E039 Hypothyroidism, unspecified: Secondary | ICD-10-CM | POA: Diagnosis not present

## 2020-09-03 DIAGNOSIS — I442 Atrioventricular block, complete: Secondary | ICD-10-CM | POA: Diagnosis not present

## 2020-09-03 DIAGNOSIS — K219 Gastro-esophageal reflux disease without esophagitis: Secondary | ICD-10-CM | POA: Diagnosis not present

## 2020-09-14 IMAGING — DX DG CHEST 2V
3 series · 3 of 3 positions shown · non-contrast
Comparison: September 14, 1998

CLINICAL DATA: Pacemaker placement.

EXAM:
CHEST - 2 VIEW

[chest lat (1 of 2)]
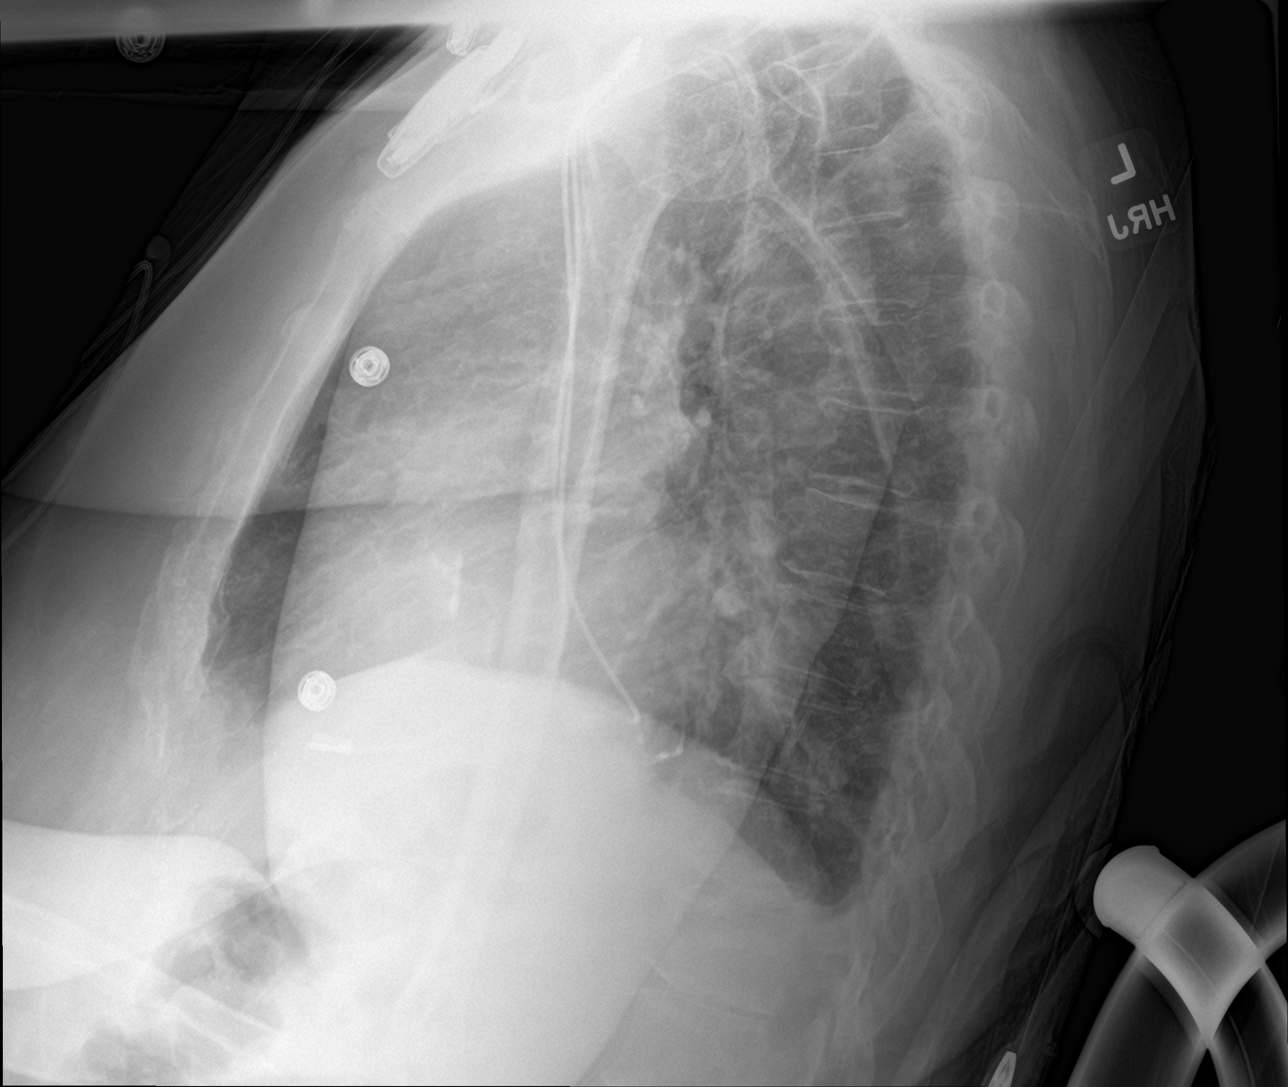

[chest ap]
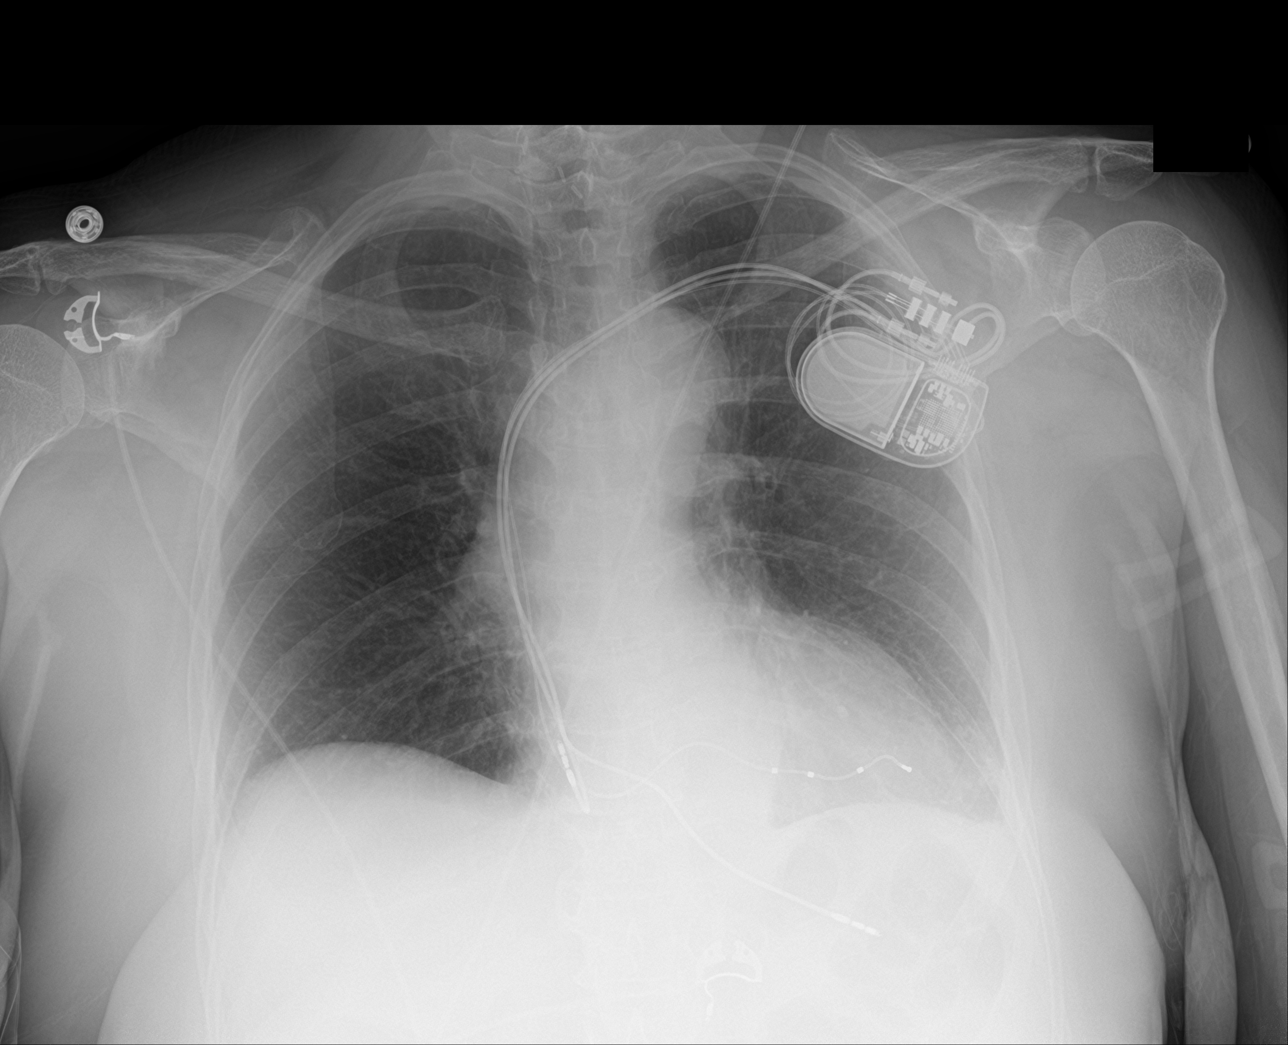

[chest lat (2 of 2)]
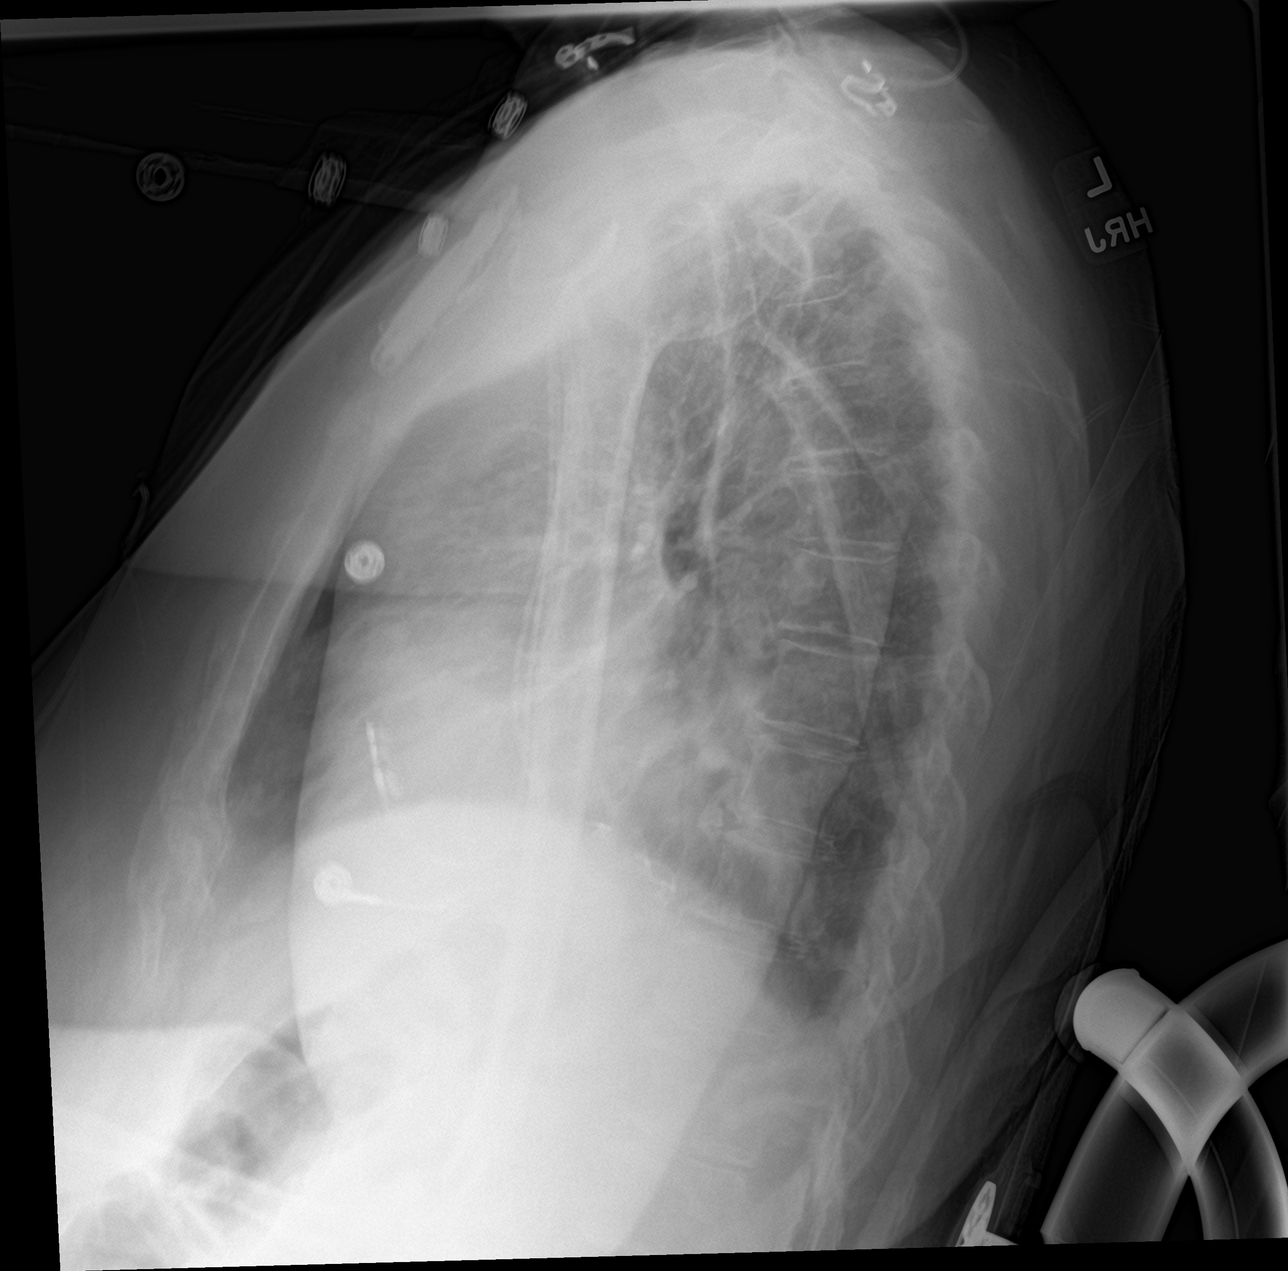

[3 of 3 positions shown; findings below may reference images not displayed]

FINDINGS: The heart size and mediastinal contours are within normal limits.
Both lungs are clear. Left-sided pacemaker is noted with leads in
grossly good position. No pneumothorax or pleural effusion is noted.
The visualized skeletal structures are unremarkable.
IMPRESSION: Interval placement of left-sided pacemaker with leads in grossly
good position. No acute cardiopulmonary abnormality seen.

## 2020-10-15 DIAGNOSIS — M5033 Other cervical disc degeneration, cervicothoracic region: Secondary | ICD-10-CM | POA: Diagnosis not present

## 2020-10-15 DIAGNOSIS — M9901 Segmental and somatic dysfunction of cervical region: Secondary | ICD-10-CM | POA: Diagnosis not present

## 2020-10-16 DIAGNOSIS — M5033 Other cervical disc degeneration, cervicothoracic region: Secondary | ICD-10-CM | POA: Diagnosis not present

## 2020-10-16 DIAGNOSIS — M9901 Segmental and somatic dysfunction of cervical region: Secondary | ICD-10-CM | POA: Diagnosis not present

## 2020-10-20 ENCOUNTER — Other Ambulatory Visit: Payer: Self-pay | Admitting: Internal Medicine

## 2020-10-20 DIAGNOSIS — M9901 Segmental and somatic dysfunction of cervical region: Secondary | ICD-10-CM | POA: Diagnosis not present

## 2020-10-20 DIAGNOSIS — M5033 Other cervical disc degeneration, cervicothoracic region: Secondary | ICD-10-CM | POA: Diagnosis not present

## 2020-10-21 DIAGNOSIS — Z23 Encounter for immunization: Secondary | ICD-10-CM | POA: Diagnosis not present

## 2020-10-22 DIAGNOSIS — M5033 Other cervical disc degeneration, cervicothoracic region: Secondary | ICD-10-CM | POA: Diagnosis not present

## 2020-10-22 DIAGNOSIS — M9901 Segmental and somatic dysfunction of cervical region: Secondary | ICD-10-CM | POA: Diagnosis not present

## 2020-10-23 DIAGNOSIS — M9901 Segmental and somatic dysfunction of cervical region: Secondary | ICD-10-CM | POA: Diagnosis not present

## 2020-10-23 DIAGNOSIS — M5033 Other cervical disc degeneration, cervicothoracic region: Secondary | ICD-10-CM | POA: Diagnosis not present

## 2020-10-27 DIAGNOSIS — M9901 Segmental and somatic dysfunction of cervical region: Secondary | ICD-10-CM | POA: Diagnosis not present

## 2020-10-27 DIAGNOSIS — M5033 Other cervical disc degeneration, cervicothoracic region: Secondary | ICD-10-CM | POA: Diagnosis not present

## 2020-10-28 ENCOUNTER — Other Ambulatory Visit: Payer: Self-pay

## 2020-10-28 ENCOUNTER — Encounter: Payer: Self-pay | Admitting: Internal Medicine

## 2020-10-28 ENCOUNTER — Ambulatory Visit (INDEPENDENT_AMBULATORY_CARE_PROVIDER_SITE_OTHER): Payer: Medicare HMO | Admitting: Internal Medicine

## 2020-10-28 VITALS — BP 110/60 | HR 78 | Ht 64.5 in | Wt 166.0 lb

## 2020-10-28 DIAGNOSIS — E890 Postprocedural hypothyroidism: Secondary | ICD-10-CM

## 2020-10-28 DIAGNOSIS — E210 Primary hyperparathyroidism: Secondary | ICD-10-CM

## 2020-10-28 DIAGNOSIS — E559 Vitamin D deficiency, unspecified: Secondary | ICD-10-CM

## 2020-10-28 DIAGNOSIS — C73 Malignant neoplasm of thyroid gland: Secondary | ICD-10-CM

## 2020-10-28 LAB — TSH: TSH: 0.22 u[IU]/mL — ABNORMAL LOW (ref 0.35–5.50)

## 2020-10-28 LAB — VITAMIN D 25 HYDROXY (VIT D DEFICIENCY, FRACTURES): VITD: 30.09 ng/mL (ref 30.00–100.00)

## 2020-10-28 LAB — T4, FREE: Free T4: 1.01 ng/dL (ref 0.60–1.60)

## 2020-10-28 LAB — CALCIUM: Calcium: 9.2 mg/dL (ref 8.4–10.5)

## 2020-10-28 NOTE — Patient Instructions (Signed)
Please stop at the lab.  Please continue 100 mcg daily of Levothyroxine.  Take the thyroid hormone every day, with water, at least 30 minutes before breakfast, separated by at least 4 hours from: - acid reflux medications - calcium - iron - multivitamins  Please come back for a follow-up appointment in 1 year. 

## 2020-10-28 NOTE — Progress Notes (Signed)
Patient ID: [name] [name], female   DOB: 1950-10-18, 70 y.o.   MRN: [id number]   This visit occurred during the SARS-CoV-2 public health emergency.  Safety protocols were in place, including screening questions prior to the visit, additional usage of staff PPE, and extensive cleaning of exam room while observing appropriate contact time as indicated for disinfecting solutions.   HPI  [name] [name] is a 70 y.o.-year-old female, initially referred by Dr. Gerrit Friends, presenting for follow-up for thyroid cancer, postsurgical hypothyroidism, history of primary hyperparathyroidism (s/p parathyroidectomy) and history of vitamin D deficiency.  Last visit 1 year ago.  Interim history: At this visit, she has no complaints. No masses felt on her neck, no dysphagia, or neck pressure.  Reviewed thyroid cancer history: Pt. has been dx with thyroid cancer in 08/2016, after having had neck exploration, left superior parathyroidectomy, and, as the mass was observed in the left thyroid lobe during surgery, she also had left lobectomy. 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 This was 2 cm, positive surgical margins.  Diagnosis 1. Parathyroid gland, Left Superior - ENLARGED PARATHYROID TISSUE, 0.445 GRAMS. 2. Thyroid, lobectomy, Left - PAPILLARY THYROID CARCINOMA, HURTHLE CELL VARIANT, 2.0 CM. - CARCINOMA IS PRESENT AT NON ORIENTED TISSUE EDGE(S). - SEE ONCOLOGY TABLE BELOW Microscopic Comment Specimen: Left thyroid Procedure (including lymph node sampling if applicable): Hemithyroidectomy Specimen Integrity (intact/fragmented): Focally disrupted Tumor focality: Unifocal Dominant tumor: Maximum tumor size (cm): 2.0 cm (gross measurement) Tumor laterality: Left thyroid Histologic type (including subtype and/or unique features as applicable): Papillary thyroid carcinoma, Hurthle cell variant Tumor capsule: Not identified Extrathyroidal  extension: Not identified Capsular invasion with degree of invasion if present: N/A Margins: Present at non-oriented tissue edge(s). Lymphatic or vascular invasion: Not identified Lymph nodes: # examined 0; # positive; N/A Extracapsular extension (if applicable): Not identified TNM code: pT1b, pNX Non-neoplastic thyroid: Hashimoto's thyroiditis  Completion thyroidectomy on 12/09/2016: Right thyroid lobe without malignancy.  RAI tx (02/09/2017): 70.8 mCi I131  Posttreatment whole body scan (02/21/2017): Negative for metastasis: 1. Expected activity in the thyroidectomy bed. 2. No evidence of metastatic disease.  Neck ultrasound (11/18/2017): As expected after thyroidectomy.  No recurrences.  Neck ultrasound (11/16/2018): No residual thyroid tissue or pathogenically enlarged lymph nodes  Latest thyroglobulin levels were undetectable: Lab Results  Component Value Date   THYROGLB <0.1 (L) 10/24/2019   THYROGLB <0.1 (L) 04/24/2019   THYROGLB <0.1 (L) 08/29/2018   THYROGLB <0.1 (L) 11/18/2017   THYROGLB <0.1 (L) 05/19/2017   THGAB <1 10/24/2019   THGAB <1.0 04/24/2019   THGAB 1 08/29/2018   THGAB <1.0 11/18/2017   THGAB 2 (H) 05/19/2017   Postsurgical hypothyroidism: -Uncontrolled   She could not tolerate Euthyrox (dizziness, headaches, heart racing, restlessness-retrospectively, these could have been caused by arrhythmia) >> changed back to levothyroxine.  Pt is on levothyroxine 100 mcg daily, taken: - in am, around 6:30 AM - fasting - at least 30 min from b'fast - no Ca, Fe, MVI, + PPIs later in the day (at night) - not on Biotin  Reviewed TFTs: Lab Results  Component Value Date   TSH 0.81 10/24/2019   TSH 0.51 04/24/2019   TSH 4.09 10/27/2018   TSH 0.320 (L) 09/14/2018   TSH 0.24 (L) 08/29/2018   TSH 0.62 03/06/2018   TSH 0.20 (L) 01/11/2018   TSH 0.07 (L) 11/18/2017   TSH 0.10 (L) 08/15/2017   TSH 0.03 (L) 05/19/2017   FREET4 0.94 10/24/2019  FREET4 1.14  04/24/2019   FREET4 0.90 10/27/2018   FREET4 1.23 08/29/2018   FREET4 0.91 03/06/2018   FREET4 1.05 01/11/2018   FREET4 1.19 11/18/2017   FREET4 1.30 08/15/2017   FREET4 1.16 05/19/2017   FREET4 0.85 02/09/2017  01/01/2019:TSH 7.16 (0.45-4.5) 12/21/2016: TSH 15.3  She does have GERD and hoarseness.  She has + FH of thyroid disorders in: sister. No FH of thyroid cancer. No h/o radiation tx to head or neck other than RAI treatment.  No herbal supplements. No Biotin use. No recent steroids use.   History of primary hyperparathyroidism: - s/p resection of a parathyroid adenoma weighing 445 mg in 08/2016  Reviewed pertinent labs: Reportedly normal calcium level 08/2020 Lab Results  Component Value Date   PTH 21 01/19/2017   PTH Comment 01/19/2017   CALCIUM 8.7 (L) 01/11/2019   CALCIUM 9.0 10/28/2018   CALCIUM 8.2 (L) 09/14/2018   CALCIUM 8.9 11/18/2017   CALCIUM 9.1 01/19/2017   CALCIUM 8.5 (L) 12/10/2016   CALCIUM 9.0 12/06/2016   CALCIUM 8.9 08/25/2016   CALCIUM 11.1 (H) 07/23/2014   CALCIUM 10.4 10/09/2013   Vitamin D deficiency:  He has a history of vitamin D deficiency: Lab Results  Component Value Date   VD25OH 31.6 10/24/2019   VD25OH 44.74 04/24/2019   VD25OH 35.36 08/29/2018   VD25OH 36.69 01/11/2018   VD25OH 28.30 (L) 11/18/2017   VD25OH 32.06 05/19/2017   VD25OH 28.28 (L) 02/09/2017   VD25OH 23.22 (L) 01/19/2017   VD25OH 20.09 (L) 10/27/2016   Previously on vitamin D 5000 units once a week >> ran out before last visit.  She had a syncopal episode on 09/14/2018.  She had a pacemaker implanted.  She walks her dog daily >> 1 mi a day.  ROS: + see HPI  I reviewed pt's medications, allergies, PMH, social hx, family hx, and changes were documented in the history of present illness. Otherwise, unchanged from my initial visit note.  Past Medical History:  Diagnosis Date   Anxiety    because of parathyroid issues   Arthritis    hands   Asthma    x 2,  triggered by allergies   Cancer ([place])    melanoma removed from back, thyroid   Depression    bc of parathyroid problems   GERD (gastroesophageal reflux disease)    History of palpitations    none since 2016   Pre-diabetes    Seasonal allergies    Varicose vein    Past Surgical History:  Procedure Laterality Date   BIV PACEMAKER INSERTION CRT-P N/A 10/30/2018   Procedure: BIV PACEMAKER INSERTION CRT-P;  Surgeon: Evans Lance, MD;  Location: [facility];  Service: Cardiovascular;  Laterality: N/A;   CARPAL TUNNEL RELEASE Right 02/05/2014   Procedure: RIGHT CARPAL TUNNEL RELEASE;  Surgeon: Jessy Oto, MD;  Location: [facility];  Service: Orthopedics;  Laterality: Right;   COLONOSCOPY     EYE SURGERY     Bilateral Lasik   LAPAROSCOPIC ASSISTED VAGINAL HYSTERECTOMY Bilateral 10/11/2013   Procedure: LAPAROSCOPIC ASSISTED VAGINAL HYSTERECTOMY BILATERAL SALPINGO OOPHORECTOMY;  Surgeon: Allena Katz, MD;  Location: [facility];  Service: Gynecology;  Laterality: Bilateral;   Malignant melanoma resected     lower back area   PARATHYROIDECTOMY Left 08/24/2016   Procedure: NECK EXPLORATION LEFT SUPERIOR PARATHYROIDECTOMY LEFT LOBE THYROIDECTOMY;  Surgeon: Armandina Gemma, MD;  Location: [facility];  Service: General;  Laterality: Left;   RIGHT/LEFT HEART CATH AND  CORONARY ANGIOGRAPHY N/A 01/15/2019   Procedure: RIGHT/LEFT HEART CATH AND CORONARY ANGIOGRAPHY;  Surgeon: Belva Crome, MD;  Location: [facility];  Service: Cardiovascular;  Laterality: N/A;   THYROIDECTOMY N/A 12/09/2016   Procedure: COMPLETION THYROIDECTOMY RIGHT LOBE;  Surgeon: Armandina Gemma, MD;  Location: [facility];  Service: General;  Laterality: N/A;   trigger thumb surgery     right   TUBAL LIGATION     VARICOSE VEIN SURGERY     leg   Social History   Socioeconomic History   Marital status: Married    Spouse name: Not on file   Number of children: 2   Years of education: Not on file   Highest  education level: Not on file  Occupational History   Occupation: Retired  Tobacco Use   Smoking status: Former    Packs/day: 1.00    Years: 10.00    Pack years: 10.00    Types: Cigarettes    Quit date: 01/05/1991    Years since quitting: 29.8   Smokeless tobacco: Never  Vaping Use   Vaping Use: Never used  Substance and Sexual Activity   Alcohol use: No   Drug use: No   Sexual activity: Yes    Birth control/protection: Post-menopausal, Surgical  Other Topics Concern   Not on file  Social History Narrative   Not on file   Social Determinants of Health   Financial Resource Strain: Not on file  Food Insecurity: Not on file  Transportation Needs: Not on file  Physical Activity: Not on file  Stress: Not on file  Social Connections: Not on file  Intimate Partner Violence: Not on file   Current Outpatient Medications on File Prior to Visit  Medication Sig Dispense Refill   levothyroxine (SYNTHROID) 100 MCG tablet TAKE 1 TABLET BY MOUTH EVERY DAY 90 tablet 0   Carboxymethylcellulose Sodium (THERATEARS OP) Place 1 drop into both eyes 2 (two) times daily.     cetirizine (ZYRTEC) 10 MG tablet Take 10 mg by mouth daily.     diphenhydramine-acetaminophen (TYLENOL PM) 25-500 MG TABS tablet Take 1 tablet by mouth at bedtime as needed (sleep).     ENTRESTO 24-26 MG TAKE 1 TABLET BY MOUTH TWICE A DAY 60 tablet 11   minocycline (MINOCIN,DYNACIN) 100 MG capsule Take 100 mg by mouth See admin instructions. Takes 100mg  daily for 7-10 days when she has "an outbreak".     naproxen sodium (ALEVE) 220 MG tablet Take 440 mg by mouth daily as needed (headache/pain).      omeprazole (PRILOSEC) 20 MG capsule Take 20 mg by mouth at bedtime.     Current Facility-Administered Medications on File Prior to Visit  Medication Dose Route Frequency Provider Last Rate Last Admin   sodium chloride flush (NS) 0.9 % injection 3 mL  3 mL Intravenous Q12H Josue Hector, MD       Allergies  Allergen Reactions    Guaifenesin & Derivatives Nausea And Vomiting    Tremors, anxiety   Penicillins     Pt states it does not work Did it involve swelling of the face/tongue/throat, SOB, or low BP? No Did it involve sudden or severe rash/hives, skin peeling, or any reaction on the inside of your mouth or nose? No Did you need to seek medical attention at a hospital or doctor's office? No When did it last happen?      5 years If all above answers are "NO", may proceed with cephalosporin use.  Sulfamethoxazole Other (See Comments)    Pt states it doesn't work and she doesn't want to take it    Family History  Problem Relation Age of Onset   Diabetes Mother 25       deceased   Heart failure Mother    CAD Mother    Heart attack Father 103       deceased   Heart attack Sister 72       deceased   Heart failure Brother 25       deceased    PE: BP 110/60 (BP Location: Left Arm, Patient Position: Sitting, Cuff Size: Normal)   Pulse 78   Ht 5' 4.5" (1.638 m)   Wt 166 lb (75.3 kg)   SpO2 96%   BMI 28.05 kg/m  Wt Readings from Last 3 Encounters:  10/28/20 166 lb (75.3 kg)  06/18/20 171 lb 3.2 oz (77.7 kg)  12/07/19 174 lb 9.6 oz (79.2 kg)   Constitutional: overweight, in NAD Eyes: PERRLA, EOMI, no exophthalmos ENT: moist mucous membranes, no neck masses palpable, no cervical lymphadenopathy Cardiovascular: RRR, No MRG Respiratory: CTA B Gastrointestinal: abdomen soft, NT, ND, BS+ Musculoskeletal: no deformities, strength intact in all 4 Skin: moist, warm, no rashes Neurological: no tremor with outstretched hands, DTR normal in all 4  ASSESSMENT: 1. Thyroid cancer - see HPI  2. Postsurgical hypothyroidism  3. H/o HPTH  4. Vitamin D def  PLAN:  1. Thyroid cancer -papillary, Hurthle cell variant -Patient has a history of incidental diagnosis of thyroid cancer after left lobectomy noticed during surgery for parathyroid adenoma.  At that time, the left lobe appeared abnormal and was  resected.  She had a 2 cm PTC focus which appears to be a Hurthle cell variant.  She is stage I TNM. However, due to the fact that her the cell variant of PTC is more aggressive, I recommended completion thyroidectomy and RAI treatment afterwards.  She had completion thyroidectomy by Dr. Harlow Asa on 12/09/2017 and then RAI treatment with 70.8 mCi I-131 on 02/09/2017. -At last check, thyroglobulin levels were undetectable.  We are following her with the [facility] thyroglobulin assays if she has a history of positive ATA antibodies.  These were undetectable at last check, however. -Reviewed the latest neck ultrasound report from 11/2018: No residual thyroid cancer or metastasis in the neck -Plan to repeat another ultrasound in 2 to 3 years from now unless she develops neck compression symptoms, in which case, we will check this sooner -I will have the patient return in 1 year  2. Postsurgical hypothyroidism - latest thyroid labs reviewed with pt. >> normal: Lab Results  Component Value Date   TSH 0.81 10/24/2019  - she continues on LT4 100 mcg daily - pt feels good on this dose. - we discussed about taking the thyroid hormone every day, with water, >30 minutes before breakfast, separated by >4 hours from acid reflux medications, calcium, iron, multivitamins. Pt. is taking it correctly. - will check thyroid tests today: TSH and fT4 - If labs are abnormal, she will need to return for repeat TFTs in 1.5 months  3. H/o Primary HPTH -She is s/p resection of a parathyroid adenoma weighing 4.45 g in 08/2016 -Calcium initially normalized, but she had a low level in 01/2019: Lab Results  Component Value Date   CALCIUM 8.7 (L) 01/11/2019  -She reportedly had normal labs in 08/2020, but I do not have these records -We will recheck this today  4. Vit D def -  Previously on 5000 units vitamin D once a week but ran out before last visit -At last visit, vitamin D level was 31.6, normal so I did not advise her to  restart vitamin D -We will repeat the vitamin D level now  Component     Latest Ref Rng & Units 10/28/2020  Calcium     8.4 - 10.5 mg/dL 9.2  TSH     0.35 - 5.50 uIU/mL 0.22 (L)  T4,Free(Direct)     0.60 - 1.60 ng/dL 1.01  VITD     30.00 - 100.00 ng/mL 30.09  Thyroglobulin Antibody     0.0 - 0.9 IU/mL <1.0  Thyroglobulin by IMA     1.5 - 38.5 ng/mL <0.1 (L)  Calcium and vitamin D levels are normal. Thyroglobulin and ATA antibodies are undetectable. TSH is suppressed.  I am surprised by this finding since she is already on a fairly low dose of levothyroxine and she had normal TFTs before on the same dose.  I would suggest to continue the same dose of levothyroxine and have her back for recheck in 2 months, before changing the dose.  [name], MD PhD [facility]

## 2020-10-29 DIAGNOSIS — M5033 Other cervical disc degeneration, cervicothoracic region: Secondary | ICD-10-CM | POA: Diagnosis not present

## 2020-10-29 DIAGNOSIS — M9901 Segmental and somatic dysfunction of cervical region: Secondary | ICD-10-CM | POA: Diagnosis not present

## 2020-10-30 DIAGNOSIS — M5033 Other cervical disc degeneration, cervicothoracic region: Secondary | ICD-10-CM | POA: Diagnosis not present

## 2020-10-30 DIAGNOSIS — M9901 Segmental and somatic dysfunction of cervical region: Secondary | ICD-10-CM | POA: Diagnosis not present

## 2020-10-30 LAB — TGAB+THYROGLOBULIN IMA OR LCMS: Thyroglobulin Antibody: <1 IU/mL (ref 0.0–0.9)

## 2020-10-30 LAB — THYROGLOBULIN BY IMA: Thyroglobulin by IMA: <0.1 ng/mL — ABNORMAL LOW (ref 1.5–38.5)

## 2020-10-31 ENCOUNTER — Telehealth: Payer: Self-pay

## 2020-10-31 NOTE — Telephone Encounter (Addendum)
Called and left a message for pt to call back and discuss results. ----- Message from [name], MD sent at 10/30/2020  5:13 PM EDT ----- Can you please call pt.:  Calcium and vitamin D levels are normal. The cancer marker, thyroglobulin, and the antithyroglobulin antibodies are undetectable, which is excellent. TSH is a little too low.  I am surprised by this since she had normal thyroid tests before on the same dose.  I would suggest to continue the same dose of levothyroxine for now and have her back for recheck in 2 months, before changing the dose.  I put the labs in.

## 2020-10-31 NOTE — Telephone Encounter (Signed)
Pt called back and was advised of lab results. Follow up lab appt scheduled.

## 2020-10-31 NOTE — Telephone Encounter (Signed)
Called and advised pt's spouse to have pt call the office back to discuss results.

## 2020-11-03 DIAGNOSIS — E039 Hypothyroidism, unspecified: Secondary | ICD-10-CM | POA: Diagnosis not present

## 2020-11-03 DIAGNOSIS — K219 Gastro-esophageal reflux disease without esophagitis: Secondary | ICD-10-CM | POA: Diagnosis not present

## 2020-11-03 DIAGNOSIS — M9901 Segmental and somatic dysfunction of cervical region: Secondary | ICD-10-CM | POA: Diagnosis not present

## 2020-11-03 DIAGNOSIS — I442 Atrioventricular block, complete: Secondary | ICD-10-CM | POA: Diagnosis not present

## 2020-11-03 DIAGNOSIS — M5033 Other cervical disc degeneration, cervicothoracic region: Secondary | ICD-10-CM | POA: Diagnosis not present

## 2020-11-04 ENCOUNTER — Ambulatory Visit (INDEPENDENT_AMBULATORY_CARE_PROVIDER_SITE_OTHER): Payer: Medicare HMO

## 2020-11-04 DIAGNOSIS — I420 Dilated cardiomyopathy: Secondary | ICD-10-CM | POA: Diagnosis not present

## 2020-11-04 LAB — CUP PACEART REMOTE DEVICE CHECK
Date Time Interrogation Session: 20221101162530
Implantable Lead Implant Date: 20201026
Implantable Lead Implant Date: 20201026
Implantable Lead Implant Date: 20201026
Implantable Lead Location: 753858
Implantable Lead Location: 753859
Implantable Lead Location: 753860
Implantable Lead Model: 377
Implantable Lead Model: 377
Implantable Lead Model: 401183
Implantable Lead Serial Number: 81173669
Implantable Lead Serial Number: 81216766
Implantable Lead Serial Number: 81267228
Implantable Pulse Generator Implant Date: 20201026
Pulse Gen Model: 407137
Pulse Gen Serial Number: 69580491

## 2020-11-05 DIAGNOSIS — M5033 Other cervical disc degeneration, cervicothoracic region: Secondary | ICD-10-CM | POA: Diagnosis not present

## 2020-11-05 DIAGNOSIS — M9901 Segmental and somatic dysfunction of cervical region: Secondary | ICD-10-CM | POA: Diagnosis not present

## 2020-11-06 DIAGNOSIS — M9901 Segmental and somatic dysfunction of cervical region: Secondary | ICD-10-CM | POA: Diagnosis not present

## 2020-11-06 DIAGNOSIS — M5033 Other cervical disc degeneration, cervicothoracic region: Secondary | ICD-10-CM | POA: Diagnosis not present

## 2020-11-10 DIAGNOSIS — M5033 Other cervical disc degeneration, cervicothoracic region: Secondary | ICD-10-CM | POA: Diagnosis not present

## 2020-11-10 DIAGNOSIS — M9901 Segmental and somatic dysfunction of cervical region: Secondary | ICD-10-CM | POA: Diagnosis not present

## 2020-11-11 NOTE — Progress Notes (Signed)
Remote pacemaker transmission.   

## 2020-11-12 DIAGNOSIS — M9901 Segmental and somatic dysfunction of cervical region: Secondary | ICD-10-CM | POA: Diagnosis not present

## 2020-11-12 DIAGNOSIS — M5033 Other cervical disc degeneration, cervicothoracic region: Secondary | ICD-10-CM | POA: Diagnosis not present

## 2020-11-18 DIAGNOSIS — H04123 Dry eye syndrome of bilateral lacrimal glands: Secondary | ICD-10-CM | POA: Diagnosis not present

## 2020-12-09 ENCOUNTER — Other Ambulatory Visit: Payer: Self-pay

## 2020-12-09 ENCOUNTER — Ambulatory Visit (INDEPENDENT_AMBULATORY_CARE_PROVIDER_SITE_OTHER): Payer: Medicare HMO | Admitting: Internal Medicine

## 2020-12-09 ENCOUNTER — Encounter: Payer: Self-pay | Admitting: Internal Medicine

## 2020-12-09 VITALS — BP 132/80 | HR 76 | Ht 64.5 in | Wt 169.6 lb

## 2020-12-09 DIAGNOSIS — I420 Dilated cardiomyopathy: Secondary | ICD-10-CM

## 2020-12-09 DIAGNOSIS — Z950 Presence of cardiac pacemaker: Secondary | ICD-10-CM

## 2020-12-09 DIAGNOSIS — I459 Conduction disorder, unspecified: Secondary | ICD-10-CM | POA: Diagnosis not present

## 2020-12-09 NOTE — Patient Instructions (Signed)
Medication Instructions:  Your physician recommends that you continue on your current medications as directed. Please refer to the Current Medication list given to you today.  *If you need a refill on your cardiac medications before your next appointment, please call your pharmacy*   Lab Work: NONE   If you have labs (blood work) drawn today and your tests are completely normal, you will receive your results only by: . MyChart Message (if you have MyChart) OR . A paper copy in the mail If you have any lab test that is abnormal or we need to change your treatment, we will call you to review the results.   Testing/Procedures: NONE    Follow-Up: At [facility], you and your health needs are our priority.  As part of our continuing mission to provide you with exceptional heart care, we have created designated Provider Care Teams.  These Care Teams include your primary Cardiologist (physician) and Advanced Practice Providers (APPs -  Physician Assistants and Nurse Practitioners) who all work together to provide you with the care you need, when you need it.  We recommend signing up for the patient portal called "MyChart".  Sign up information is provided on this After Visit Summary.  MyChart is used to connect with patients for Virtual Visits (Telemedicine).  Patients are able to view lab/test results, encounter notes, upcoming appointments, etc.  Non-urgent messages can be sent to your provider as well.   To learn more about what you can do with MyChart, go to [email or web address].    Your next appointment:   1 year(s)  The format for your next appointment:   In Person  Provider:   Gregg Taylor, MD   Other Instructions Thank you for choosing [facility]!    

## 2020-12-09 NOTE — Progress Notes (Signed)
HPI Mrs. [name] [name] returns today for followup. She is a pleasant 70 yo woman with a h/o CHB, s/p PPM insertion. She has done well in the interim. No chest pain or sob and no syncope. No edema.  Allergies  Allergen Reactions   Guaifenesin & Derivatives Nausea And Vomiting    Tremors, anxiety   Penicillins     Pt states it does not work Did it involve swelling of the face/tongue/throat, SOB, or low BP? No Did it involve sudden or severe rash/hives, skin peeling, or any reaction on the inside of your mouth or nose? No Did you need to seek medical attention at a hospital or doctor's office? No When did it last happen?      5 years If all above answers are "NO", may proceed with cephalosporin use.    Sulfamethoxazole Other (See Comments)    Pt states it doesn't work and she doesn't want to take it      Current Outpatient Medications  Medication Sig Dispense Refill   Carboxymethylcellulose Sodium (THERATEARS OP) Place 1 drop into both eyes 2 (two) times daily.     cetirizine (ZYRTEC) 10 MG tablet Take 10 mg by mouth daily.     diphenhydramine-acetaminophen (TYLENOL PM) 25-500 MG TABS tablet Take 1 tablet by mouth at bedtime as needed (sleep).     ENTRESTO 24-26 MG TAKE 1 TABLET BY MOUTH TWICE A DAY 60 tablet 11   levothyroxine (SYNTHROID) 100 MCG tablet TAKE 1 TABLET BY MOUTH EVERY DAY 90 tablet 0   minocycline (MINOCIN,DYNACIN) 100 MG capsule Take 100 mg by mouth See admin instructions. Takes 100mg  daily for 7-10 days when she has "an outbreak".     naproxen sodium (ALEVE) 220 MG tablet Take 440 mg by mouth daily as needed (headache/pain).      omeprazole (PRILOSEC) 20 MG capsule Take 20 mg by mouth at bedtime.     Current Facility-Administered Medications  Medication Dose Route Frequency Provider Last Rate Last Admin   sodium chloride flush (NS) 0.9 % injection 3 mL  3 mL Intravenous Q12H Josue Hector, MD         Past Medical History:  Diagnosis Date   Anxiety     because of parathyroid issues   Arthritis    hands   Asthma    x 2, triggered by allergies   Cancer ([place])    melanoma removed from back, thyroid   Depression    bc of parathyroid problems   GERD (gastroesophageal reflux disease)    History of palpitations    none since 2016   Pre-diabetes    Seasonal allergies    Varicose vein     ROS:   All systems reviewed and negative except as noted in the HPI.   Past Surgical History:  Procedure Laterality Date   BIV PACEMAKER INSERTION CRT-P N/A 10/30/2018   Procedure: BIV PACEMAKER INSERTION CRT-P;  Surgeon: Evans Lance, MD;  Location: [facility];  Service: Cardiovascular;  Laterality: N/A;   CARPAL TUNNEL RELEASE Right 02/05/2014   Procedure: RIGHT CARPAL TUNNEL RELEASE;  Surgeon: Jessy Oto, MD;  Location: [facility];  Service: Orthopedics;  Laterality: Right;   COLONOSCOPY     EYE SURGERY     Bilateral Lasik   LAPAROSCOPIC ASSISTED VAGINAL HYSTERECTOMY Bilateral 10/11/2013   Procedure: LAPAROSCOPIC ASSISTED VAGINAL HYSTERECTOMY BILATERAL SALPINGO OOPHORECTOMY;  Surgeon: Allena Katz, MD;  Location: [facility];  Service: Gynecology;  Laterality: Bilateral;   Malignant melanoma resected     lower back area   PARATHYROIDECTOMY Left 08/24/2016   Procedure: NECK EXPLORATION LEFT SUPERIOR PARATHYROIDECTOMY LEFT LOBE THYROIDECTOMY;  Surgeon: Armandina Gemma, MD;  Location: [facility];  Service: General;  Laterality: Left;   RIGHT/LEFT HEART CATH AND CORONARY ANGIOGRAPHY N/A 01/15/2019   Procedure: RIGHT/LEFT HEART CATH AND CORONARY ANGIOGRAPHY;  Surgeon: Belva Crome, MD;  Location: [facility];  Service: Cardiovascular;  Laterality: N/A;   THYROIDECTOMY N/A 12/09/2016   Procedure: COMPLETION THYROIDECTOMY RIGHT LOBE;  Surgeon: Armandina Gemma, MD;  Location: [facility];  Service: General;  Laterality: N/A;   trigger thumb surgery     right   TUBAL LIGATION     VARICOSE VEIN SURGERY     leg     Family History   Problem Relation Age of Onset   Diabetes Mother 46       deceased   Heart failure Mother    CAD Mother    Heart attack Father 63       deceased   Heart attack Sister 64       deceased   Heart failure Brother 51       deceased     Social History   Socioeconomic History   Marital status: Married    Spouse name: Not on file   Number of children: 2   Years of education: Not on file   Highest education level: Not on file  Occupational History   Occupation: Retired  Tobacco Use   Smoking status: Former    Packs/day: 1.00    Years: 10.00    Pack years: 10.00    Types: Cigarettes    Quit date: 01/05/1991    Years since quitting: 29.9   Smokeless tobacco: Never  Vaping Use   Vaping Use: Never used  Substance and Sexual Activity   Alcohol use: No   Drug use: No   Sexual activity: Yes    Birth control/protection: Post-menopausal, Surgical  Other Topics Concern   Not on file  Social History Narrative   Not on file   Social Determinants of Health   Financial Resource Strain: Not on file  Food Insecurity: Not on file  Transportation Needs: Not on file  Physical Activity: Not on file  Stress: Not on file  Social Connections: Not on file  Intimate Partner Violence: Not on file     BP 132/80   Pulse 76   Ht 5' 4.5" (1.638 m)   Wt 169 lb 9.6 oz (76.9 kg)   SpO2 97%   BMI 28.66 kg/m   Physical Exam:  Well appearing 70 yo woman, NAD HEENT: Unremarkable Neck:  No JVD, no thyromegally Lymphatics:  No adenopathy Back:  No CVA tenderness Lungs:  Clear with no wheezes HEART:  Regular rate rhythm, no murmurs, no rubs, no clicks Abd:  soft, positive bowel sounds, no organomegally, no rebound, no guarding Ext:  2 plus pulses, no edema, no cyanosis, no clubbing Skin:  No rashes no nodules Neuro:  CN II through XII intact, motor grossly intact  DEVICE  Normal device function.  See PaceArt for details.   Assess/Plan:  1. CHB - she has no escape today. She is  asymptomatic, s/p PPM insertion. 2. PPM - her Biotronik BIV PPM is working normally. We turned her LV output down a bit to maximize battery longevity. 3. HTN - her bp is well controlled. She will maintain a low sodium diet. 4.  LV dysfunction - she is s/p biv PPM and her EF has near normalized. Continue current meds.   Carleene Overlie Dushaun Okey,MD

## 2021-01-05 DIAGNOSIS — R059 Cough, unspecified: Secondary | ICD-10-CM | POA: Diagnosis not present

## 2021-01-05 DIAGNOSIS — J0100 Acute maxillary sinusitis, unspecified: Secondary | ICD-10-CM | POA: Diagnosis not present

## 2021-01-07 ENCOUNTER — Other Ambulatory Visit: Payer: Medicare HMO

## 2021-01-15 ENCOUNTER — Other Ambulatory Visit: Payer: Self-pay | Admitting: Internal Medicine

## 2021-01-20 ENCOUNTER — Other Ambulatory Visit: Payer: Self-pay | Admitting: Internal Medicine

## 2021-01-26 DIAGNOSIS — K219 Gastro-esophageal reflux disease without esophagitis: Secondary | ICD-10-CM | POA: Diagnosis not present

## 2021-01-26 DIAGNOSIS — Z0001 Encounter for general adult medical examination with abnormal findings: Secondary | ICD-10-CM | POA: Diagnosis not present

## 2021-01-26 DIAGNOSIS — Z13220 Encounter for screening for lipoid disorders: Secondary | ICD-10-CM | POA: Diagnosis not present

## 2021-01-26 DIAGNOSIS — R7301 Impaired fasting glucose: Secondary | ICD-10-CM | POA: Diagnosis not present

## 2021-01-26 DIAGNOSIS — E039 Hypothyroidism, unspecified: Secondary | ICD-10-CM | POA: Diagnosis not present

## 2021-01-29 DIAGNOSIS — J301 Allergic rhinitis due to pollen: Secondary | ICD-10-CM | POA: Diagnosis not present

## 2021-01-29 DIAGNOSIS — R7301 Impaired fasting glucose: Secondary | ICD-10-CM | POA: Diagnosis not present

## 2021-01-29 DIAGNOSIS — I519 Heart disease, unspecified: Secondary | ICD-10-CM | POA: Diagnosis not present

## 2021-01-29 DIAGNOSIS — L718 Other rosacea: Secondary | ICD-10-CM | POA: Diagnosis not present

## 2021-01-29 DIAGNOSIS — Z85850 Personal history of malignant neoplasm of thyroid: Secondary | ICD-10-CM | POA: Diagnosis not present

## 2021-01-29 DIAGNOSIS — Z0001 Encounter for general adult medical examination with abnormal findings: Secondary | ICD-10-CM | POA: Diagnosis not present

## 2021-01-29 DIAGNOSIS — E039 Hypothyroidism, unspecified: Secondary | ICD-10-CM | POA: Diagnosis not present

## 2021-01-29 DIAGNOSIS — Z6828 Body mass index (BMI) 28.0-28.9, adult: Secondary | ICD-10-CM | POA: Diagnosis not present

## 2021-02-03 ENCOUNTER — Ambulatory Visit (INDEPENDENT_AMBULATORY_CARE_PROVIDER_SITE_OTHER): Payer: Medicare HMO

## 2021-02-03 DIAGNOSIS — I420 Dilated cardiomyopathy: Secondary | ICD-10-CM | POA: Diagnosis not present

## 2021-02-03 LAB — CUP PACEART REMOTE DEVICE CHECK
Date Time Interrogation Session: 20230131081734
Implantable Lead Implant Date: 20201026
Implantable Lead Implant Date: 20201026
Implantable Lead Implant Date: 20201026
Implantable Lead Location: 753858
Implantable Lead Location: 753859
Implantable Lead Location: 753860
Implantable Lead Model: 377
Implantable Lead Model: 377
Implantable Lead Model: 401183
Implantable Lead Serial Number: 81173669
Implantable Lead Serial Number: 81216766
Implantable Lead Serial Number: 81267228
Implantable Pulse Generator Implant Date: 20201026
Pulse Gen Model: 407137
Pulse Gen Serial Number: 69580491

## 2021-02-10 ENCOUNTER — Other Ambulatory Visit: Payer: Self-pay | Admitting: Cardiovascular Disease

## 2021-02-11 NOTE — Progress Notes (Signed)
Remote pacemaker transmission.   

## 2021-04-19 ENCOUNTER — Other Ambulatory Visit: Payer: Self-pay | Admitting: Internal Medicine

## 2021-05-05 ENCOUNTER — Ambulatory Visit (INDEPENDENT_AMBULATORY_CARE_PROVIDER_SITE_OTHER): Payer: Medicare HMO

## 2021-05-05 DIAGNOSIS — I420 Dilated cardiomyopathy: Secondary | ICD-10-CM

## 2021-05-05 LAB — CUP PACEART REMOTE DEVICE CHECK
Date Time Interrogation Session: 20230502102341
Implantable Lead Implant Date: 20201026
Implantable Lead Implant Date: 20201026
Implantable Lead Implant Date: 20201026
Implantable Lead Location: 753858
Implantable Lead Location: 753859
Implantable Lead Location: 753860
Implantable Lead Model: 377
Implantable Lead Model: 377
Implantable Lead Model: 401183
Implantable Lead Serial Number: 81173669
Implantable Lead Serial Number: 81216766
Implantable Lead Serial Number: 81267228
Implantable Pulse Generator Implant Date: 20201026
Pulse Gen Model: 407137
Pulse Gen Serial Number: 69580491

## 2021-05-20 NOTE — Progress Notes (Signed)
Remote pacemaker transmission.   

## 2021-06-30 DIAGNOSIS — R051 Acute cough: Secondary | ICD-10-CM | POA: Diagnosis not present

## 2021-06-30 DIAGNOSIS — J0100 Acute maxillary sinusitis, unspecified: Secondary | ICD-10-CM | POA: Diagnosis not present

## 2021-07-19 ENCOUNTER — Other Ambulatory Visit: Payer: Self-pay | Admitting: Internal Medicine

## 2021-07-20 DIAGNOSIS — E039 Hypothyroidism, unspecified: Secondary | ICD-10-CM | POA: Diagnosis not present

## 2021-07-20 DIAGNOSIS — Z13220 Encounter for screening for lipoid disorders: Secondary | ICD-10-CM | POA: Diagnosis not present

## 2021-07-20 DIAGNOSIS — R7301 Impaired fasting glucose: Secondary | ICD-10-CM | POA: Diagnosis not present

## 2021-07-20 DIAGNOSIS — K2100 Gastro-esophageal reflux disease with esophagitis, without bleeding: Secondary | ICD-10-CM | POA: Diagnosis not present

## 2021-07-21 DIAGNOSIS — E039 Hypothyroidism, unspecified: Secondary | ICD-10-CM | POA: Diagnosis not present

## 2021-07-21 DIAGNOSIS — Z13220 Encounter for screening for lipoid disorders: Secondary | ICD-10-CM | POA: Diagnosis not present

## 2021-07-27 DIAGNOSIS — I519 Heart disease, unspecified: Secondary | ICD-10-CM | POA: Diagnosis not present

## 2021-07-27 DIAGNOSIS — L718 Other rosacea: Secondary | ICD-10-CM | POA: Diagnosis not present

## 2021-07-27 DIAGNOSIS — K2100 Gastro-esophageal reflux disease with esophagitis, without bleeding: Secondary | ICD-10-CM | POA: Diagnosis not present

## 2021-07-27 DIAGNOSIS — Z85850 Personal history of malignant neoplasm of thyroid: Secondary | ICD-10-CM | POA: Diagnosis not present

## 2021-07-27 DIAGNOSIS — R7301 Impaired fasting glucose: Secondary | ICD-10-CM | POA: Diagnosis not present

## 2021-07-27 DIAGNOSIS — E039 Hypothyroidism, unspecified: Secondary | ICD-10-CM | POA: Diagnosis not present

## 2021-07-27 DIAGNOSIS — J301 Allergic rhinitis due to pollen: Secondary | ICD-10-CM | POA: Diagnosis not present

## 2021-07-27 DIAGNOSIS — Z6828 Body mass index (BMI) 28.0-28.9, adult: Secondary | ICD-10-CM | POA: Diagnosis not present

## 2021-08-04 ENCOUNTER — Ambulatory Visit (INDEPENDENT_AMBULATORY_CARE_PROVIDER_SITE_OTHER): Payer: Medicare HMO

## 2021-08-04 DIAGNOSIS — I420 Dilated cardiomyopathy: Secondary | ICD-10-CM | POA: Diagnosis not present

## 2021-08-04 LAB — CUP PACEART REMOTE DEVICE CHECK
Date Time Interrogation Session: 20230801134235
Implantable Lead Implant Date: 20201026
Implantable Lead Implant Date: 20201026
Implantable Lead Implant Date: 20201026
Implantable Lead Location: 753858
Implantable Lead Location: 753859
Implantable Lead Location: 753860
Implantable Lead Model: 377
Implantable Lead Model: 377
Implantable Lead Model: 401183
Implantable Lead Serial Number: 81173669
Implantable Lead Serial Number: 81216766
Implantable Lead Serial Number: 81267228
Implantable Pulse Generator Implant Date: 20201026
Pulse Gen Model: 407137
Pulse Gen Serial Number: 69580491

## 2021-08-19 DIAGNOSIS — Z950 Presence of cardiac pacemaker: Secondary | ICD-10-CM | POA: Diagnosis not present

## 2021-08-19 DIAGNOSIS — Z86010 Personal history of colonic polyps: Secondary | ICD-10-CM | POA: Diagnosis not present

## 2021-08-20 ENCOUNTER — Telehealth: Payer: Self-pay | Admitting: *Deleted

## 2021-08-20 NOTE — Telephone Encounter (Signed)
   Name: [name] [name]  DOB: 02/23/1950  MRN: [id number]  Primary Cardiologist: None   Preoperative team, please contact this patient and set up a phone call appointment on or after 09/02/2021 for further preoperative risk assessment. Please obtain consent and complete medication review. Thank you for your help.  I confirm that guidance regarding antiplatelet and oral anticoagulation therapy has been completed and, if necessary, noted below (none requested)   [name], NP 08/20/2021, 11:31 AM [place]

## 2021-08-20 NOTE — Telephone Encounter (Signed)
1st attempt to reach pt regarding surgical clearance and the need for a tele visit.  Left a message for pt to call back and ask for the preop team. 

## 2021-08-20 NOTE — Telephone Encounter (Signed)
   Pre-operative Risk Assessment    Patient Name: [name] [name]  DOB: 1950/11/18 MRN: [id number]      Request for Surgical Clearance    Procedure:   COLONOSCOPY  Date of Surgery:  Clearance 11/02/21                                 Surgeon:  DR. MARC MAGOD Surgeon's Group or Practice Name:  [facility] Phone number:  [phone] Fax number:  [phone]   Type of Clearance Requested:   - Medical    Type of Anesthesia:  Not Indicated   Additional requests/questions:    Astrid Divine   08/20/2021, 7:34 AM

## 2021-08-21 NOTE — Telephone Encounter (Signed)
Patient has upcoming appointment with Tommye Standard PA-C on 8/22. Will add "preop clearance" to appt notes and route to provider

## 2021-08-22 NOTE — Progress Notes (Unsigned)
Cardiology Office Note Date:  08/22/2021  Patient ID:  [name] [name], [name] [name] [birth year], MRN [id number] PCP:  Caryl Bis, MD  Cardiologist:  Dr. Johnsie Cancel Electrophysiologist: Dr. Lovena Le  ***refresh   Chief Complaint: ***, ??? pt with concerns pacer needs programming, pre-op (colonoscopy), NICM  History of Present Illness: [name] [name] is a 71 y.o. female with history of anxiety, arthritis, hypothyroidism, CHB w/PPM  She comes today to be seen for Dr. Lovena Le, last seen by him Dec 2022, doing well, discussed near normal LVEF.  He reduced LV output some for battery longevity.  *** LV capture?? *** why does she need pacer programming? Symptoms? *** CM meds... no BB?? ? BP  RCRI is one (with NICM), 0.9%  Device information Biotronik CRT-P implanted 10/30/2018   Past Medical History:  Diagnosis Date   Anxiety    because of parathyroid issues   Arthritis    hands   Asthma    x 2, triggered by allergies   Cancer ([place])    melanoma removed from back, thyroid   Depression    bc of parathyroid problems   GERD (gastroesophageal reflux disease)    History of palpitations    none since 2016   Pre-diabetes    Seasonal allergies    Varicose vein     Past Surgical History:  Procedure Laterality Date   BIV PACEMAKER INSERTION CRT-P N/A 10/30/2018   Procedure: BIV PACEMAKER INSERTION CRT-P;  Surgeon: Evans Lance, MD;  Location: [place] [facility];  Service: Cardiovascular;  Laterality: N/A;   CARPAL TUNNEL RELEASE Right 02/05/2014   Procedure: RIGHT CARPAL TUNNEL RELEASE;  Surgeon: Jessy Oto, MD;  Location: [place];  Service: Orthopedics;  Laterality: Right;   COLONOSCOPY     EYE SURGERY     Bilateral Lasik   LAPAROSCOPIC ASSISTED VAGINAL HYSTERECTOMY Bilateral 10/11/2013   Procedure: LAPAROSCOPIC ASSISTED VAGINAL HYSTERECTOMY BILATERAL SALPINGO OOPHORECTOMY;  Surgeon: Allena Katz, MD;  Location: [facility];  Service: Gynecology;   Laterality: Bilateral;   Malignant melanoma resected     lower back area   PARATHYROIDECTOMY Left 08/24/2016   Procedure: NECK EXPLORATION LEFT SUPERIOR PARATHYROIDECTOMY LEFT LOBE THYROIDECTOMY;  Surgeon: Armandina Gemma, MD;  Location: [place];  Service: General;  Laterality: Left;   RIGHT/LEFT HEART CATH AND CORONARY ANGIOGRAPHY N/A 01/15/2019   Procedure: RIGHT/LEFT HEART CATH AND CORONARY ANGIOGRAPHY;  Surgeon: Belva Crome, MD;  Location: [facility];  Service: Cardiovascular;  Laterality: N/A;   THYROIDECTOMY N/A 12/09/2016   Procedure: COMPLETION THYROIDECTOMY RIGHT LOBE;  Surgeon: Armandina Gemma, MD;  Location: [facility];  Service: General;  Laterality: N/A;   trigger thumb surgery     right   TUBAL LIGATION     VARICOSE VEIN SURGERY     leg    Current Outpatient Medications  Medication Sig Dispense Refill   Carboxymethylcellulose Sodium (THERATEARS OP) Place 1 drop into both eyes 2 (two) times daily.     cetirizine (ZYRTEC) 10 MG tablet Take 10 mg by mouth daily.     diphenhydramine-acetaminophen (TYLENOL PM) 25-500 MG TABS tablet Take 1 tablet by mouth at bedtime as needed (sleep).     ENTRESTO 24-26 MG TAKE 1 TABLET BY MOUTH TWICE A DAY 60 tablet 11   levothyroxine (SYNTHROID) 100 MCG tablet TAKE 1 TABLET BY MOUTH EVERY DAY 90 tablet 0   minocycline (MINOCIN,DYNACIN) 100 MG capsule Take 100 mg by mouth See admin instructions. Takes '100mg'$  daily for  7-10 days when she has "an outbreak".     naproxen sodium (ALEVE) 220 MG tablet Take 440 mg by mouth daily as needed (headache/pain).      omeprazole (PRILOSEC) 20 MG capsule Take 20 mg by mouth at bedtime.     Current Facility-Administered Medications  Medication Dose Route Frequency Provider Last Rate Last Admin   sodium chloride flush (NS) 0.9 % injection 3 mL  3 mL Intravenous Q12H Josue Hector, MD        Allergies:   Guaifenesin & derivatives, Penicillins, and Sulfamethoxazole   Social History:  The patient  reports that  she quit smoking about 30 years ago. Her smoking use included cigarettes. She has a 10.00 pack-year smoking history. She has never used smokeless tobacco. She reports that she does not drink alcohol and does not use drugs.   Family History:  The patient's family history includes CAD in her mother; Diabetes (age of onset: 65) in her mother; Heart attack (age of onset: 47) in her sister; Heart attack (age of onset: 14) in her father; Heart failure in her mother; Heart failure (age of onset: 58) in her brother.  ROS:  Please see the history of present illness.    All other systems are reviewed and otherwise negative.   PHYSICAL EXAM:  VS:  There were no vitals taken for this visit. BMI: There is no height or weight on file to calculate BMI. Well nourished, well developed, in no acute distress HEENT: normocephalic, atraumatic Neck: no JVD, carotid bruits or masses Cardiac:  *** RRR; no significant murmurs, no rubs, or gallops Lungs:  *** CTA b/l, no wheezing, rhonchi or rales Abd: soft, nontender MS: no deformity or *** atrophy Ext: *** no edema Skin: warm and dry, no rash Neuro:  No gross deficits appreciated Psych: euthymic mood, full affect  *** PPM site is stable, no tethering or discomfort   EKG:  Done today and reviewed by myself shows  ***  Device interrogation done today and reviewed by myself:  ***   10/08/2019: TTE 1. Left ventricular ejection fraction, by estimation, is 50 to 55%. The  left ventricle has low normal function. The left ventricle has no regional  wall motion abnormalities. Left ventricular diastolic parameters are  consistent with Grade I diastolic  dysfunction (impaired relaxation).   2. Right ventricular systolic function is normal. The right ventricular  size is normal. There is normal pulmonary artery systolic pressure.   3. Left atrial size was mildly dilated.   4. The mitral valve is normal in structure. Trivial mitral valve  regurgitation. No  evidence of mitral stenosis.   5. The aortic valve is tricuspid. Aortic valve regurgitation is trivial.  No aortic stenosis is present.   6. The inferior vena cava is normal in size with greater than 50%  respiratory variability, suggesting right atrial pressure of 3 mmHg.    01/15/2019: LHC Normal left main Widely patent LAD with mild diffuse luminal irregularities noted mid to distal. Normal circumflex Widely patent RCA with mild diffuse luminal irregularities. Mild to moderate left ventricular systolic dysfunction with estimated EF 35 to 45%.  LVEDP is normal. Normal right heart pressures with capillary wedge 5 mmHg.   10/27/2018: TTE  1. Left ventricular ejection fraction, by visual estimation, is 40 to  45%. The left ventricle has mildly decreased function. Normal left  ventricular size. There is no left ventricular hypertrophy.   2. Left ventricular diastolic Doppler parameters are consistent with  impaired  relaxation pattern of LV diastolic filling.   3. Global right ventricle has normal systolic function.The right  ventricular size is normal. No increase in right ventricular wall  thickness.   4. Left atrial size was mildly dilated.   5. Right atrial size was normal.   6. Mild aortic valve annular calcification.   7. The mitral valve is normal in structure. Mild mitral valve  regurgitation. No evidence of mitral stenosis.   8. The tricuspid valve is normal in structure. Tricuspid valve  regurgitation is trivial.   9. The aortic valve is tricuspid Aortic valve regurgitation is mild by  color flow Doppler. Structurally normal aortic valve, with no evidence of  sclerosis or stenosis.  10. There is Mild calcification of the aortic valve.  11. The pulmonic valve was not well visualized. Pulmonic valve  regurgitation is not visualized by color flow Doppler.  12. Normal pulmonary artery systolic pressure.  13. The inferior vena cava is normal in size with greater than 50%   respiratory variability, suggesting right atrial pressure of 3 mmHg.   Recent Labs: 10/28/2020: TSH 0.22  No results found for requested labs within last 365 days.   CrCl cannot be calculated (Patient's most recent lab result is older than the maximum 21 days allowed.).   Wt Readings from Last 3 Encounters:  12/09/20 169 lb 9.6 oz (76.9 kg)  10/28/20 166 lb (75.3 kg)  06/18/20 171 lb 3.2 oz (77.7 kg)     Other studies reviewed: Additional studies/records reviewed today include: summarized above  ASSESSMENT AND PLAN:  CRT-P ***  NICM Recovery of her LVEF by last echo to 50-55% ***  Pre-procedure Low cardiac risk procedure Low cardiac risk score ***  Disposition: F/u with ***  Current medicines are reviewed at length with the patient today.  The patient did not have any concerns regarding medicines.  Venetia Night, PA-C 08/22/2021 4:37 PM     [address] [place] [address] [phone] (office)  [phone] (fax)

## 2021-08-25 ENCOUNTER — Ambulatory Visit: Payer: Medicare HMO | Admitting: Physician Assistant

## 2021-08-25 ENCOUNTER — Encounter: Payer: Self-pay | Admitting: Physician Assistant

## 2021-08-25 ENCOUNTER — Ambulatory Visit
Admission: RE | Admit: 2021-08-25 | Discharge: 2021-08-25 | Disposition: A | Discharge status: Home / Self Care | Payer: Medicare HMO | Source: Ambulatory Visit | Attending: Physician Assistant | Admitting: Physician Assistant

## 2021-08-25 VITALS — BP 120/64 | HR 76 | Ht 64.5 in | Wt 174.4 lb

## 2021-08-25 DIAGNOSIS — R0600 Dyspnea, unspecified: Secondary | ICD-10-CM

## 2021-08-25 DIAGNOSIS — I4892 Unspecified atrial flutter: Secondary | ICD-10-CM

## 2021-08-25 DIAGNOSIS — Z950 Presence of cardiac pacemaker: Secondary | ICD-10-CM

## 2021-08-25 DIAGNOSIS — I428 Other cardiomyopathies: Secondary | ICD-10-CM

## 2021-08-25 DIAGNOSIS — Z01818 Encounter for other preprocedural examination: Secondary | ICD-10-CM | POA: Diagnosis not present

## 2021-08-25 DIAGNOSIS — R0602 Shortness of breath: Secondary | ICD-10-CM | POA: Diagnosis not present

## 2021-08-25 LAB — CUP PACEART INCLINIC DEVICE CHECK
Date Time Interrogation Session: 20230822193959
Implantable Lead Implant Date: 20201026
Implantable Lead Implant Date: 20201026
Implantable Lead Implant Date: 20201026
Implantable Lead Location: 753858
Implantable Lead Location: 753859
Implantable Lead Location: 753860
Implantable Lead Model: 377
Implantable Lead Model: 377
Implantable Lead Model: 401183
Implantable Lead Serial Number: 81173669
Implantable Lead Serial Number: 81216766
Implantable Lead Serial Number: 81267228
Implantable Pulse Generator Implant Date: 20201026
Lead Channel Pacing Threshold Amplitude: 0.5 V
Lead Channel Pacing Threshold Amplitude: 0.7 V
Lead Channel Pacing Threshold Amplitude: 2 V
Lead Channel Pacing Threshold Pulse Width: 0.4 ms
Lead Channel Pacing Threshold Pulse Width: 0.4 ms
Lead Channel Pacing Threshold Pulse Width: 0.75 ms
Lead Channel Sensing Intrinsic Amplitude: 1.6 mV
Pulse Gen Model: 407137
Pulse Gen Serial Number: 69580491

## 2021-08-25 NOTE — Patient Instructions (Addendum)
Medication Instructions:   Your physician recommends that you continue on your current medications as directed. Please refer to the Current Medication list given to you today.  *If you need a refill on your cardiac medications before your next appointment, please call your pharmacy*   Lab Work:  BMET AND BNP TODAY   If you have labs (blood work) drawn today and your tests are completely normal, you will receive your results only by: [place] (if you have MyChart) OR A paper copy in the mail If you have any lab test that is abnormal or we need to change your treatment, we will call you to review the results.   Testing/Procedures: Your physician has requested that you have an echocardiogram. Echocardiography is a painless test that uses sound waves to create images of your heart. It provides your doctor with information about the size and shape of your heart and how well your heart's chambers and valves are working. This procedure takes approximately one hour. There are no restrictions for this procedure.  A chest x-ray takes a picture of the organs and structures inside the chest, including the heart, lungs, and blood vessels. This test can show several things, including, whether the heart is enlarges; whether fluid is building up in the lungs; and whether pacemaker / defibrillator leads are still in place. \ Located in: [facility] Address: [address] [place], [place], [place] [address] Areas served: [place] and nearby areas Hours:  Tuesday 8AM-5:30PM Wednesday 8AM-5:30PM Thursday 8AM-5:30PM Friday 8AM-5:30PM Saturday Closed Sunday Closed Monday 8AM-5:30PM Phone: [phone]    Follow-Up: At [facility], you and your health needs are our priority.  As part of our continuing mission to provide you with exceptional heart care, we have created designated Provider Care Teams.  These Care Teams include your primary Cardiologist (physician) and Advanced  Practice Providers (APPs -  Physician Assistants and Nurse Practitioners) who all work together to provide you with the care you need, when you need it.  We recommend signing up for the patient portal called "MyChart".  Sign up information is provided on this After Visit Summary.  MyChart is used to connect with patients for Virtual Visits (Telemedicine).  Patients are able to view lab/test results, encounter notes, upcoming appointments, etc.  Non-urgent messages can be sent to your provider as well.   To learn more about what you can do with MyChart, go to [email or web address].    Your next appointment:   1 month(s)  The format for your next appointment:   In Person  Provider:   Legrand Como "Oda Kilts, PA-C    Other Instructions   Important Information About [place]

## 2021-08-26 LAB — BASIC METABOLIC PANEL
BUN/Creatinine Ratio: 14 (ref 12–28)
BUN: 10 mg/dL (ref 8–27)
CO2: 24 mmol/L (ref 20–29)
Calcium: 9.2 mg/dL (ref 8.7–10.3)
Chloride: 102 mmol/L (ref 96–106)
Creatinine, Ser: 0.74 mg/dL (ref 0.57–1.00)
Glucose: 110 mg/dL — ABNORMAL HIGH (ref 70–99)
Potassium: 4.2 mmol/L (ref 3.5–5.2)
Sodium: 142 mmol/L (ref 134–144)
eGFR: 86 mL/min/{1.73_m2} (ref >59)

## 2021-08-26 LAB — PRO B NATRIURETIC PEPTIDE: NT-Pro BNP: 483 pg/mL — ABNORMAL HIGH (ref 0–301)

## 2021-08-27 NOTE — Addendum Note (Signed)
Addended by: Drue Novel I on: 08/27/2021 09:33 AM   Modules accepted: Orders

## 2021-09-01 NOTE — Progress Notes (Signed)
Remote pacemaker transmission.   

## 2021-09-09 ENCOUNTER — Ambulatory Visit (HOSPITAL_COMMUNITY): Payer: Medicare HMO | Attending: Cardiology

## 2021-09-09 DIAGNOSIS — R0609 Other forms of dyspnea: Secondary | ICD-10-CM

## 2021-09-09 DIAGNOSIS — R0600 Dyspnea, unspecified: Secondary | ICD-10-CM | POA: Diagnosis not present

## 2021-09-09 LAB — ECHOCARDIOGRAM COMPLETE
Area-P 1/2: 4.89 cm2
S' Lateral: 3.2 cm

## 2021-09-09 MED ORDER — PERFLUTREN LIPID MICROSPHERE
1.0000 mL | INTRAVENOUS | Status: AC | PRN
  Administered 2021-09-09: 2 mL via INTRAVENOUS

## 2021-09-10 ENCOUNTER — Other Ambulatory Visit: Payer: Self-pay | Admitting: *Deleted

## 2021-09-10 MED ORDER — METOPROLOL SUCCINATE ER 25 MG PO TB24: 25.0000 mg | ORAL_TABLET | Freq: Every day | ORAL | 3 refills | Status: DC

## 2021-09-24 ENCOUNTER — Encounter: Payer: Medicare HMO | Admitting: Physician Assistant

## 2021-10-04 NOTE — Progress Notes (Unsigned)
Cardiology Office Note Date:  10/04/2021  Patient ID:  [name], [name] [birth year], MRN [id number] PCP:  Caryl Bis, MD  Cardiologist:  Dr. Johnsie Cancel Electrophysiologist: Dr. Lovena Le    Chief Complaint: pt with concerns pacer needs programming, pre-op (colonoscopy)  History of Present Illness: [name] [name] is a 71 y.o. female with history of anxiety, arthritis, hypothyroidism, CHB w/PPM, NICM  She comes today to be seen for Dr. Lovena Le, last seen by him Dec 2022, doing well, discussed near normal LVEF.  He reduced LV output some for battery longevity.  Needs cardiac eval prior to colonoscopy  I saw her 08/25/21 She has now for a few months felt more tired, getting winded more easily No rest SOB, no CP. When she is doing more heavier activities she is getting SOB, having to pace herself, and gets a fullness, not quite a pain in her L upper abdomen, up under left low rib boarder Feels somewhat congested No near syncope or syncope. She reports the DOE as new for a few months and had not had this in the past. No near syncope or syncope. Her colonoscopy is overdue for a 5 year follow up 2/2 prior polyps RCRI is one (with NICM), 0.9% Her device EKG optimized QRS duration reduced from baseline 180-114m > 1421mwith zero offset Planned for updated echo, labs  LVEF down to 35-40%, global hypokinesis, grade I DD, RV ok, no significant VHD No dysynchrony described Started on Toprol and recommended to be seen by Dr. NiDelana Meyeream  CXR was clear  *** symptoms? Any better? *** volume *** cards appt not scheduled yet *** any Af?  Device information Biotronik CRT-P implanted 10/30/2018   Past Medical History:  Diagnosis Date   Anxiety    because of parathyroid issues   Arthritis    hands   Asthma    x 2, triggered by allergies   [place]   melanoma removed from back, thyroid   Depression    bc of parathyroid problems   GERD (gastroesophageal reflux  disease)    History of palpitations    none since 2016   Pre-diabetes    Seasonal allergies    Varicose vein     Past Surgical History:  Procedure Laterality Date   BIV PACEMAKER INSERTION CRT-P N/A 10/30/2018   Procedure: BIV PACEMAKER INSERTION CRT-P;  Surgeon: TaEvans LanceMD;  [facility];  Service: Cardiovascular;  Laterality: N/A;   CARPAL TUNNEL RELEASE Right 02/05/2014   Procedure: RIGHT CARPAL TUNNEL RELEASE;  Surgeon: JaJessy OtoMD;  [facility] Service: Orthopedics;  Laterality: Right;   COLONOSCOPY     EYE SURGERY     Bilateral Lasik   LAPAROSCOPIC ASSISTED VAGINAL HYSTERECTOMY Bilateral 10/11/2013   Procedure: LAPAROSCOPIC ASSISTED VAGINAL HYSTERECTOMY BILATERAL SALPINGO OOPHORECTOMY;  Surgeon: JaAllena KatzMD;  [facility];  Service: Gynecology;  Laterality: Bilateral;   Malignant melanoma resected     lower back area   PARATHYROIDECTOMY Left 08/24/2016   Procedure: NECK EXPLORATION LEFT SUPERIOR PARATHYROIDECTOMY LEFT LOBE THYROIDECTOMY;  Surgeon: GeArmandina GemmaMD;  [facility] Service: General;  Laterality: Left;   RIGHT/LEFT HEART CATH AND CORONARY ANGIOGRAPHY N/A 01/15/2019   Procedure: RIGHT/LEFT HEART CATH AND CORONARY ANGIOGRAPHY;  Surgeon: SmBelva CromeMD;  [facility];  Service: Cardiovascular;  Laterality: N/A;   THYROIDECTOMY N/A 12/09/2016   Procedure: COMPLETION THYROIDECTOMY RIGHT LOBE;  Surgeon:  Armandina Gemma, MD;  [facility];  Service: General;  Laterality: N/A;   trigger thumb surgery     right   TUBAL LIGATION     VARICOSE VEIN SURGERY     leg    Current Outpatient Medications  Medication Sig Dispense Refill   Carboxymethylcellulose Sodium (THERATEARS OP) Place 1 drop into both eyes 2 (two) times daily.     cetirizine (ZYRTEC) 10 MG tablet Take 10 mg by mouth daily.     diphenhydramine-acetaminophen (TYLENOL PM) 25-500 MG TABS tablet Take 1 tablet by mouth at  bedtime as needed (sleep).     ENTRESTO 24-26 MG TAKE 1 TABLET BY MOUTH TWICE A DAY 60 tablet 11   levothyroxine (SYNTHROID) 100 MCG tablet TAKE 1 TABLET BY MOUTH EVERY DAY 90 tablet 0   metoprolol succinate (TOPROL XL) 25 MG 24 hr tablet Take 1 tablet (25 mg total) by mouth daily. 30 tablet 3   minocycline (MINOCIN,DYNACIN) 100 MG capsule Take 100 mg by mouth See admin instructions. Takes '100mg'$  daily for 7-10 days when she has "an outbreak".     naproxen sodium (ALEVE) 220 MG tablet Take 440 mg by mouth daily as needed (headache/pain).      omeprazole (PRILOSEC) 20 MG capsule Take 20 mg by mouth at bedtime.     Current Facility-Administered Medications  Medication Dose Route Frequency Provider Last Rate Last Admin   sodium chloride flush (NS) 0.9 % injection 3 mL  3 mL Intravenous Q12H Josue Hector, MD        Allergies:   Guaifenesin & derivatives, Penicillins, and Sulfamethoxazole   Social History:  The patient  reports that she quit smoking about 30 years ago. Her smoking use included cigarettes. She has a 10.00 pack-year smoking history. She has never used smokeless tobacco. She reports that she does not drink alcohol and does not use drugs.   Family History:  The patient's family history includes CAD in her mother; Diabetes (age of onset: 56) in her mother; Heart attack (age of onset: 63) in her sister; Heart attack (age of onset: 76) in her father; Heart failure in her mother; Heart failure (age of onset: 58) in her brother.  ROS:  Please see the history of present illness.    All other systems are reviewed and otherwise negative.   PHYSICAL EXAM:  VS:  There were no vitals taken for this visit. BMI: There is no height or weight on file to calculate BMI. Well nourished, well developed, in no acute distress HEENT: normocephalic, atraumatic Neck: no JVD, carotid bruits or masses Cardiac:  *** RRR; no significant murmurs, no rubs, or gallops Lungs:  *** CTA b/l, no wheezing, rhonchi  or rales Abd: soft, nontender MS: no deformity or atrophy Ext: *** no edema Skin: warm and dry, no rash Neuro:  No gross deficits appreciated Psych: euthymic mood, full affect  *** PPM site is stable, no tethering or discomfort   EKG:  Done today and reviewed by myself shows  ***  Device interrogation done today and reviewed by myself:  ***  09/09/21: TTE 1. Left ventricular ejection fraction, by estimation, is 35 to 40%. The  left ventricle has moderately decreased function. The left ventricle  demonstrates global hypokinesis. There is moderate concentric left  ventricular hypertrophy. Left ventricular  diastolic parameters are consistent with Grade I diastolic dysfunction  (impaired relaxation).   2. Right ventricular systolic function is normal. The right ventricular  size is normal.   3.  The mitral valve is degenerative. Mild mitral valve regurgitation. No  evidence of mitral stenosis.   4. The aortic valve is tricuspid. Aortic valve regurgitation is trivial.  Aortic valve sclerosis/calcification is present, without any evidence of  aortic stenosis.   5. Aortic dilatation noted. There is mild dilatation of the ascending  aorta, measuring 39 mm.   10/08/2019: TTE 1. Left ventricular ejection fraction, by estimation, is 50 to 55%. The  left ventricle has low normal function. The left ventricle has no regional  wall motion abnormalities. Left ventricular diastolic parameters are  consistent with Grade I diastolic  dysfunction (impaired relaxation).   2. Right ventricular systolic function is normal. The right ventricular  size is normal. There is normal pulmonary artery systolic pressure.   3. Left atrial size was mildly dilated.   4. The mitral valve is normal in structure. Trivial mitral valve  regurgitation. No evidence of mitral stenosis.   5. The aortic valve is tricuspid. Aortic valve regurgitation is trivial.  No aortic stenosis is present.   6. The inferior vena  cava is normal in size with greater than 50%  respiratory variability, suggesting right atrial pressure of 3 mmHg.    01/15/2019: LHC Normal left main Widely patent LAD with mild diffuse luminal irregularities noted mid to distal. Normal circumflex Widely patent RCA with mild diffuse luminal irregularities. Mild to moderate left ventricular systolic dysfunction with estimated EF 35 to 45%.  LVEDP is normal. Normal right heart pressures with capillary wedge 5 mmHg.   10/27/2018: TTE  1. Left ventricular ejection fraction, by visual estimation, is 40 to  45%. The left ventricle has mildly decreased function. Normal left  ventricular size. There is no left ventricular hypertrophy.   2. Left ventricular diastolic Doppler parameters are consistent with  impaired relaxation pattern of LV diastolic filling.   3. Global right ventricle has normal systolic function.The right  ventricular size is normal. No increase in right ventricular wall  thickness.   4. Left atrial size was mildly dilated.   5. Right atrial size was normal.   6. Mild aortic valve annular calcification.   7. The mitral valve is normal in structure. Mild mitral valve  regurgitation. No evidence of mitral stenosis.   8. The tricuspid valve is normal in structure. Tricuspid valve  regurgitation is trivial.   9. The aortic valve is tricuspid Aortic valve regurgitation is mild by  color flow Doppler. Structurally normal aortic valve, with no evidence of  sclerosis or stenosis.  10. There is Mild calcification of the aortic valve.  11. The pulmonic valve was not well visualized. Pulmonic valve  regurgitation is not visualized by color flow Doppler.  12. Normal pulmonary artery systolic pressure.  13. The inferior vena cava is normal in size with greater than 50%  respiratory variability, suggesting right atrial pressure of 3 mmHg.   Recent Labs: 10/28/2020: TSH 0.22 08/25/2021: BUN 10; Creatinine, Ser 0.74; NT-Pro BNP 483;  Potassium 4.2; Sodium 142  No results found for requested labs within last 365 days.   CrCl cannot be calculated (Patient's most recent lab result is older than the maximum 21 days allowed.).   Wt Readings from Last 3 Encounters:  08/25/21 174 lb 6.4 oz (79.1 kg)  12/09/20 169 lb 9.6 oz (76.9 kg)  10/28/20 166 lb (75.3 kg)     Other studies reviewed: Additional studies/records reviewed today include: summarized above  ASSESSMENT AND PLAN:  CRT-P *** Intact function ***  NICM Recovery of  her LVEF by echo to 50-55% on Oct 2021 >> back down by this last echo No significant CAD by cath 2021 *** BB, Entresto *** needs to see gen cards, defer pre-colonoscopy decision to them   3. AFlutter *** New and low burden *** Follow via her device  Disposition: ***  Current medicines are reviewed at length with the patient today.  The patient did not have any concerns regarding medicines.  Venetia Night, PA-C 10/04/2021 9:49 AM     [facility] [address] [address] [phone] (office)  [phone] (fax)

## 2021-10-05 ENCOUNTER — Ambulatory Visit: Payer: Medicare HMO | Attending: Physician Assistant | Admitting: Physician Assistant

## 2021-10-05 VITALS — BP 118/66 | HR 60 | Ht 64.5 in | Wt 174.0 lb

## 2021-10-05 DIAGNOSIS — Z950 Presence of cardiac pacemaker: Secondary | ICD-10-CM | POA: Diagnosis not present

## 2021-10-05 DIAGNOSIS — Z79899 Other long term (current) drug therapy: Secondary | ICD-10-CM | POA: Diagnosis not present

## 2021-10-05 DIAGNOSIS — I428 Other cardiomyopathies: Secondary | ICD-10-CM

## 2021-10-05 LAB — CUP PACEART INCLINIC DEVICE CHECK
Date Time Interrogation Session: 20231002130250
Implantable Lead Implant Date: 20201026
Implantable Lead Implant Date: 20201026
Implantable Lead Implant Date: 20201026
Implantable Lead Location: 753858
Implantable Lead Location: 753859
Implantable Lead Location: 753860
Implantable Lead Model: 377
Implantable Lead Model: 377
Implantable Lead Model: 401183
Implantable Lead Serial Number: 81173669
Implantable Lead Serial Number: 81216766
Implantable Lead Serial Number: 81267228
Implantable Pulse Generator Implant Date: 20201026
Lead Channel Pacing Threshold Amplitude: 0.5 V
Lead Channel Pacing Threshold Amplitude: 1 V
Lead Channel Pacing Threshold Amplitude: 1.9 V
Lead Channel Pacing Threshold Pulse Width: 0.4 ms
Lead Channel Pacing Threshold Pulse Width: 0.4 ms
Lead Channel Pacing Threshold Pulse Width: 0.75 ms
Lead Channel Sensing Intrinsic Amplitude: 1.5 mV
Pulse Gen Model: 407137
Pulse Gen Serial Number: 69580491

## 2021-10-05 NOTE — Patient Instructions (Signed)
Medication Instructions:    Your physician recommends that you continue on your current medications as directed. Please refer to the Current Medication list given to you today.  *If you need a refill on your cardiac medications before your next appointment, please call your pharmacy*   Lab Work:  [place]   If you have labs (blood work) drawn today and your tests are completely normal, you will receive your results only by: [place] (if you have MyChart) OR A paper copy in the mail If you have any lab test that is abnormal or we need to change your treatment, we will call you to review the results.   Testing/Procedures: NONE ORDERED  TODAY    Follow-Up: At [facility], you and your health needs are our priority.  As part of our continuing mission to provide you with exceptional heart care, we have created designated Provider Care Teams.  These Care Teams include your primary Cardiologist (physician) and Advanced Practice Providers (APPs -  Physician Assistants and Nurse Practitioners) who all work together to provide you with the care you need, when you need it.  We recommend signing up for the patient portal called "MyChart".  Sign up information is provided on this After Visit Summary.  MyChart is used to connect with patients for Virtual Visits (Telemedicine).  Patients are able to view lab/test results, encounter notes, upcoming appointments, etc.  Non-urgent messages can be sent to your provider as well.   To learn more about what you can do with MyChart, go to [email or web address].    Your next appointment:  IN 2 WEEKS PHARMACIST FOR MEDICATION ADJUSTMENTS   6  week(s)  Pamelia Hoit   The format for your next appointment:    In Person  EP TAYLOR / URSUY  ONE YEAR   Other Instructions:   Important Information About Sugar

## 2021-10-08 DIAGNOSIS — Z1231 Encounter for screening mammogram for malignant neoplasm of breast: Secondary | ICD-10-CM | POA: Diagnosis not present

## 2021-10-08 DIAGNOSIS — M8588 Other specified disorders of bone density and structure, other site: Secondary | ICD-10-CM | POA: Diagnosis not present

## 2021-10-08 DIAGNOSIS — Z01419 Encounter for gynecological examination (general) (routine) without abnormal findings: Secondary | ICD-10-CM | POA: Diagnosis not present

## 2021-10-08 DIAGNOSIS — Z1272 Encounter for screening for malignant neoplasm of vagina: Secondary | ICD-10-CM | POA: Diagnosis not present

## 2021-10-08 DIAGNOSIS — Z6829 Body mass index (BMI) 29.0-29.9, adult: Secondary | ICD-10-CM | POA: Diagnosis not present

## 2021-10-23 ENCOUNTER — Other Ambulatory Visit: Payer: Self-pay | Admitting: Internal Medicine

## 2021-10-28 DIAGNOSIS — Z23 Encounter for immunization: Secondary | ICD-10-CM | POA: Diagnosis not present

## 2021-10-29 ENCOUNTER — Ambulatory Visit: Payer: Medicare HMO | Admitting: Internal Medicine

## 2021-10-29 ENCOUNTER — Encounter: Payer: Self-pay | Admitting: Internal Medicine

## 2021-10-29 VITALS — BP 108/66 | HR 92 | Ht 64.5 in | Wt 171.6 lb

## 2021-10-29 DIAGNOSIS — C73 Malignant neoplasm of thyroid gland: Secondary | ICD-10-CM

## 2021-10-29 DIAGNOSIS — E890 Postprocedural hypothyroidism: Secondary | ICD-10-CM

## 2021-10-29 DIAGNOSIS — E210 Primary hyperparathyroidism: Secondary | ICD-10-CM | POA: Diagnosis not present

## 2021-10-29 DIAGNOSIS — E559 Vitamin D deficiency, unspecified: Secondary | ICD-10-CM | POA: Diagnosis not present

## 2021-10-29 LAB — T4, FREE: Free T4: 1.16 ng/dL (ref 0.60–1.60)

## 2021-10-29 LAB — VITAMIN D 25 HYDROXY (VIT D DEFICIENCY, FRACTURES): VITD: 26.59 ng/mL — ABNORMAL LOW (ref 30.00–100.00)

## 2021-10-29 LAB — TSH: TSH: 0.25 u[IU]/mL — ABNORMAL LOW (ref 0.35–5.50)

## 2021-10-29 NOTE — Progress Notes (Signed)
Patient ID: [name] [name], female   DOB: 07-Dec-1950, 71 y.o.   MRN: [id number]   HPI  [name] [name] is a 71 y.o.-year-old pleasant female, initially referred by Dr. Harlow Asa, presenting for follow-up for thyroid cancer, postsurgical hypothyroidism, history of primary hyperparathyroidism (s/p parathyroidectomy) and history of vitamin D deficiency.  Last visit 1 year ago.  Interim history: No masses felt on her neck, no dysphagia, or neck pressure. She had fatigue, SOB >> evaluation pointed to dilated NICM. She had her pacemaker adjusted and Toprol added.  Reviewed thyroid cancer history: Pt. has been dx with thyroid cancer in 08/2016, after having had neck exploration, left superior parathyroidectomy, and, as the mass was observed in the left thyroid lobe during surgery, she also had left lobectomy. 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 This was 2 cm, positive surgical margins.  Diagnosis 1. Parathyroid gland, Left Superior - ENLARGED PARATHYROID TISSUE, 0.445 GRAMS. 2. Thyroid, lobectomy, Left - PAPILLARY THYROID CARCINOMA, HURTHLE CELL VARIANT, 2.0 CM. - CARCINOMA IS PRESENT AT NON ORIENTED TISSUE EDGE(S). - SEE ONCOLOGY TABLE BELOW Microscopic Comment Specimen: Left thyroid Procedure (including lymph node sampling if applicable): Hemithyroidectomy Specimen Integrity (intact/fragmented): Focally disrupted Tumor focality: Unifocal Dominant tumor: Maximum tumor size (cm): 2.0 cm (gross measurement) Tumor laterality: Left thyroid Histologic type (including subtype and/or unique features as applicable): Papillary thyroid carcinoma, Hurthle cell variant Tumor capsule: Not identified Extrathyroidal extension: Not identified Capsular invasion with degree of invasion if present: N/A Margins: Present at non-oriented tissue edge(s). Lymphatic or vascular invasion: Not identified Lymph nodes: # examined 0; # positive;  N/A Extracapsular extension (if applicable): Not identified TNM code: pT1b, pNX Non-neoplastic thyroid: Hashimoto's thyroiditis  Completion thyroidectomy on 12/09/2016: Right thyroid lobe without malignancy.  RAI tx (02/09/2017): 70.8 mCi I131  Posttreatment whole body scan (02/21/2017): Negative for metastasis: 1. Expected activity in the thyroidectomy bed. 2. No evidence of metastatic disease.  Neck ultrasound (11/18/2017): As expected after thyroidectomy.  No recurrences.  Neck ultrasound (11/16/2018): No residual thyroid tissue or pathogenically enlarged lymph nodes  Latest thyroglobulin levels were undetectable: Lab Results  Component Value Date   THYROGLB <0.1 (L) 10/28/2020   THYROGLB <0.1 (L) 10/24/2019   THYROGLB <0.1 (L) 04/24/2019   THYROGLB <0.1 (L) 08/29/2018   THYROGLB <0.1 (L) 11/18/2017   THYROGLB <0.1 (L) 05/19/2017   THGAB <1.0 10/28/2020   THGAB <1 10/24/2019   THGAB <1.0 04/24/2019   THGAB 1 08/29/2018   THGAB <1.0 11/18/2017   THGAB 2 (H) 05/19/2017   Postsurgical hypothyroidism: -Uncontrolled   She could not tolerate Euthyrox (dizziness, headaches, heart racing, restlessness-retrospectively, these could have been caused by arrhythmia) >> changed back to levothyroxine.  Pt is on levothyroxine 100 mcg daily, taken: - in am, around 6:30 AM - fasting - at least 30 min from b'fast - no Ca, Fe, MVI, + PPIs later in the day (at night) - not on Biotin  Reviewed TFTs: 07/2021: by PCP: ? Lab Results  Component Value Date   TSH 0.22 (L) 10/28/2020   TSH 0.81 10/24/2019   TSH 0.51 04/24/2019   TSH 4.09 10/27/2018   TSH 0.320 (L) 09/14/2018   TSH 0.24 (L) 08/29/2018   TSH 0.62 03/06/2018   TSH 0.20 (L) 01/11/2018   TSH 0.07 (L) 11/18/2017   TSH 0.10 (L) 08/15/2017   FREET4 1.01 10/28/2020   FREET4 0.94 10/24/2019   FREET4 1.14 04/24/2019   FREET4 0.90 10/27/2018   FREET4 1.23  08/29/2018   FREET4 0.91 03/06/2018   FREET4 1.05 01/11/2018   FREET4  1.19 11/18/2017   FREET4 1.30 08/15/2017   FREET4 1.16 05/19/2017  01/01/2019:TSH 7.16 (0.45-4.5) 12/21/2016: TSH 15.3  She does have GERD and hoarseness.  She has + FH of thyroid disorders in: sister. No FH of thyroid cancer. No h/o radiation tx to head or neck other than RAI treatment. No herbal supplements. No Biotin use. No recent steroids use.   History of primary hyperparathyroidism: - s/p resection of a parathyroid adenoma weighing 445 mg in 08/2016  Reviewed pertinent labs: Lab Results  Component Value Date   PTH 21 01/19/2017   PTH Comment 01/19/2017   CALCIUM 9.2 08/25/2021   CALCIUM 9.2 10/28/2020   CALCIUM 8.7 (L) 01/11/2019   CALCIUM 9.0 10/28/2018   CALCIUM 8.2 (L) 09/14/2018   CALCIUM 8.9 11/18/2017   CALCIUM 9.1 01/19/2017   CALCIUM 8.5 (L) 12/10/2016   CALCIUM 9.0 12/06/2016   CALCIUM 8.9 08/25/2016  Reportedly normal calcium level 08/2020  Vitamin D deficiency:  He has a history of vitamin D deficiency: Lab Results  Component Value Date   VD25OH 30.09 10/28/2020   VD25OH 31.6 10/24/2019   VD25OH 44.74 04/24/2019   VD25OH 35.36 08/29/2018   VD25OH 36.69 01/11/2018   VD25OH 28.30 (L) 11/18/2017   VD25OH 32.06 05/19/2017   VD25OH 28.28 (L) 02/09/2017   VD25OH 23.22 (L) 01/19/2017   VD25OH 20.09 (L) 10/27/2016   Previously on vitamin D 5000 units once a week >> ran out in 2021 and did not restart.  She had a syncopal episode on 09/14/2018.  She had a pacemaker implanted.  She walks her dog daily >> 1 mi a day.  ROS: + see HPI  I reviewed pt's medications, allergies, PMH, social hx, family hx, and changes were documented in the history of present illness. Otherwise, unchanged from my initial visit note.  Past Medical History:  Diagnosis Date   Anxiety    because of parathyroid issues   Arthritis    hands   Asthma    x 2, triggered by allergies   Cancer ([place])    melanoma removed from back, thyroid   Depression    bc of parathyroid  problems   GERD (gastroesophageal reflux disease)    History of palpitations    none since 2016   Pre-diabetes    Seasonal allergies    Varicose vein    Past Surgical History:  Procedure Laterality Date   BIV PACEMAKER INSERTION CRT-P N/A 10/30/2018   Procedure: BIV PACEMAKER INSERTION CRT-P;  Surgeon: Evans Lance, MD;  Location: [place] [facility];  Service: Cardiovascular;  Laterality: N/A;   CARPAL TUNNEL RELEASE Right 02/05/2014   Procedure: RIGHT CARPAL TUNNEL RELEASE;  Surgeon: Jessy Oto, MD;  Location: [place];  Service: Orthopedics;  Laterality: Right;   COLONOSCOPY     EYE SURGERY     Bilateral Lasik   LAPAROSCOPIC ASSISTED VAGINAL HYSTERECTOMY Bilateral 10/11/2013   Procedure: LAPAROSCOPIC ASSISTED VAGINAL HYSTERECTOMY BILATERAL SALPINGO OOPHORECTOMY;  Surgeon: Allena Katz, MD;  Location: [place] [facility];  Service: Gynecology;  Laterality: Bilateral;   Malignant melanoma resected     lower back area   PARATHYROIDECTOMY Left 08/24/2016   Procedure: NECK EXPLORATION LEFT SUPERIOR PARATHYROIDECTOMY LEFT LOBE THYROIDECTOMY;  Surgeon: Armandina Gemma, MD;  Location: [place];  Service: General;  Laterality: Left;   RIGHT/LEFT HEART CATH AND CORONARY ANGIOGRAPHY N/A 01/15/2019   Procedure: RIGHT/LEFT HEART CATH AND  CORONARY ANGIOGRAPHY;  Surgeon: Belva Crome, MD;  Location: [place] [facility];  Service: Cardiovascular;  Laterality: N/A;   THYROIDECTOMY N/A 12/09/2016   Procedure: COMPLETION THYROIDECTOMY RIGHT LOBE;  Surgeon: Armandina Gemma, MD;  Location: [facility];  Service: General;  Laterality: N/A;   trigger thumb surgery     right   TUBAL LIGATION     VARICOSE VEIN SURGERY     leg   Social History   Socioeconomic History   Marital status: Married    Spouse name: Not on file   Number of children: 2   Years of education: Not on file   Highest education level: Not on file  Occupational History   Occupation: Retired  Tobacco Use   Smoking status: Former     Packs/day: 1.00    Years: 10.00    Total pack years: 10.00    Types: Cigarettes    Quit date: 01/05/1991    Years since quitting: 30.8   Smokeless tobacco: Never  Vaping Use   Vaping Use: Never used  Substance and Sexual Activity   Alcohol use: No   Drug use: No   Sexual activity: Yes    Birth control/protection: Post-menopausal, Surgical  Other Topics Concern   Not on file  Social History Narrative   Not on file   Social Determinants of Health   Financial Resource Strain: Not on file  Food Insecurity: Not on file  Transportation Needs: Not on file  Physical Activity: Not on file  Stress: Not on file  Social Connections: Not on file  Intimate Partner Violence: Not on file   Current Outpatient Medications on File Prior to Visit  Medication Sig Dispense Refill   Carboxymethylcellulose Sodium (THERATEARS OP) Place 1 drop into both eyes 2 (two) times daily. (Patient not taking: Reported on 10/05/2021)     cetirizine (ZYRTEC) 10 MG tablet Take 10 mg by mouth daily.     diphenhydramine-acetaminophen (TYLENOL PM) 25-500 MG TABS tablet Take 1 tablet by mouth at bedtime as needed (sleep).     ENTRESTO 24-26 MG TAKE 1 TABLET BY MOUTH TWICE A DAY 60 tablet 11   levothyroxine (SYNTHROID) 100 MCG tablet TAKE 1 TABLET BY MOUTH EVERY DAY 90 tablet 0   metoprolol succinate (TOPROL XL) 25 MG 24 hr tablet Take 1 tablet (25 mg total) by mouth daily. 30 tablet 3   minocycline (MINOCIN,DYNACIN) 100 MG capsule Take 100 mg by mouth See admin instructions. Takes 100mg  daily for 7-10 days when she has "an outbreak".     naproxen sodium (ALEVE) 220 MG tablet Take 440 mg by mouth daily as needed (headache/pain).      omeprazole (PRILOSEC) 20 MG capsule Take 20 mg by mouth at bedtime.     Current Facility-Administered Medications on File Prior to Visit  Medication Dose Route Frequency Provider Last Rate Last Admin   sodium chloride flush (NS) 0.9 % injection 3 mL  3 mL Intravenous Q12H Josue Hector,  MD       Allergies  Allergen Reactions   Guaifenesin & Derivatives Nausea And Vomiting    Tremors, anxiety   Penicillins     Pt states it does not work Did it involve swelling of the face/tongue/throat, SOB, or low BP? No Did it involve sudden or severe rash/hives, skin peeling, or any reaction on the inside of your mouth or nose? No Did you need to seek medical attention at a hospital or doctor's office? No When did it last happen?  5 years If all above answers are "NO", may proceed with cephalosporin use.  Pt states it does not work Pt states it does not work Pt states it does not work Did it involve swelling of the face/tongue/throat, SOB, or low BP? No Did it involve sudden or severe rash/hives, skin peeling, or any reaction on the inside of your mouth or nose? No Did you need to seek medical attention at a hospital or doctor's office? No When did it last happen?      5 years If all above answers are "NO", may proceed with cephalosporin use.   Sulfamethoxazole Other (See Comments)    Pt states it doesn't work and she doesn't want to take it    Family History  Problem Relation Age of Onset   Diabetes Mother 68       deceased   Heart failure Mother    CAD Mother    Heart attack Father 61       deceased   Heart attack Sister 18       deceased   Heart failure Brother 15       deceased   PE: BP 108/66 (BP Location: Left Arm, Patient Position: Sitting, Cuff Size: Normal)   Pulse 92   Ht 5' 4.5" (1.638 m)   Wt 171 lb 9.6 oz (77.8 kg)   SpO2 95%   BMI 29.00 kg/m  Wt Readings from Last 3 Encounters:  10/29/21 171 lb 9.6 oz (77.8 kg)  10/05/21 174 lb (78.9 kg)  08/25/21 174 lb 6.4 oz (79.1 kg)   Constitutional: overweight, in NAD Eyes:  EOMI, no exophthalmos ENT: no neck masses, no cervical lymphadenopathy Cardiovascular: RRR, No MRG Respiratory: CTA B Musculoskeletal: no deformities Skin:no rashes Neurological: no tremor with outstretched  hands  ASSESSMENT: 1. Thyroid cancer - see HPI  2. Postsurgical hypothyroidism  3. H/o HPTH  4. Vitamin D def  PLAN:  1. Thyroid cancer -papillary, Hurthle cell variant -Patient has a history of incidental diagnosis of thyroid cancer after left lobectomy noticed during surgery for parathyroid adenoma.  At that time, the left lobe appeared abnormal and was resected.  She had a 2 cm PTC focus which appears to be a Hurthle cell variant.  She is stage I TNM. However, due to the fact that her the cell variant of PTC is more aggressive, I recommended completion thyroidectomy and RAI treatment afterwards.  She had completion thyroidectomy by Dr. Harlow Asa on 12/09/2017 and then RAI treatment with 70.8 mCi I-131 on 02/09/2017. -We are following her with the [facility] thyroglobulin assays if she has a history of positive ATA antibodies.  These were undetectable at last check.  Also, thyroglobulin level was undetectable at that time. -Reviewed the latest neck ultrasound report from 11/2018: No residual thyroid cancer or metastasis in neck -Plan to repeat another ultrasound next year, unless she develops neck compression symptoms, in which case we will repeat this earlier -I will have the patient return in 1 year  2. Postsurgical hypothyroidism - latest thyroid labs reviewed with pt. >> TSH was slightly low: Lab Results  Component Value Date   TSH 0.22 (L) 10/28/2020  - she continues on LT4 100 mcg daily  - at last office visit, I advised her for repeat labs, before changing the levothyroxine dose, but she did not return yet... She tells me that her PCP checked her TSH but she does not know if the result was normal and I do not have the record. -  pt feels good on this dose. - we discussed about taking the thyroid hormone every day, with water, >30 minutes before breakfast, separated by >4 hours from acid reflux medications, calcium, iron, multivitamins. Pt. is taking it correctly. - will check thyroid  tests today: TSH and fT4 - If labs are abnormal, she will need to return for repeat TFTs in 1.5 months  3. H/o Primary HPTH -She is s/p resection of a parathyroid adenoma weighing 4.45 g in 08/2016 -Calcium normalized initially, but then she had a low level in 01/2019.  However, afterwards, calcium was again normal in 08/2020 in 08/2021: Lab Results  Component Value Date   CALCIUM 9.2 08/25/2021  -We will not repeat this today  4. Vit D def -Previously on 5000 units vitamin D once a week but then ran out -At last visit, vitamin D was normal, 30.09 off the supplement - we will repeat a vitamin D level today  Needs refills.  Component     Latest Ref Rng 10/29/2021  VITD     30.00 - 100.00 ng/mL 26.59 (L)   T4,Free(Direct)     0.60 - 1.60 ng/dL 1.16   TSH     0.35 - 5.50 uIU/mL 0.25 (L)   Thyroglobulin Antibody     0.0 - 0.9 IU/mL <1.0   Thyroglobulin by IMA     1.5 - 38.5 ng/mL <0.1 (L)   Vitamin D is still now low.  I will advise her to restart vitamin D, ideally 1000 units daily.  We will plan to repeat another level in 2 months. Thyroglobulin is undetectable, which is great.  ATA antibodies are undetectable. TSH is slightly low, I will advise her to decrease the dose of levothyroxine to 88 mcg daily and repeat the tests in 2 months.  We can have her take the current dose of 100 mcg only 6 out of 7 days for now.   [name], MD PhD [facility]

## 2021-10-29 NOTE — Patient Instructions (Signed)
Please stop at the lab.  Please continue 100 mcg daily of Levothyroxine.  Take the thyroid hormone every day, with water, at least 30 minutes before breakfast, separated by at least 4 hours from: - acid reflux medications - calcium - iron - multivitamins  Please come back for a follow-up appointment in 1 year.

## 2021-10-31 LAB — TGAB+THYROGLOBULIN IMA OR LCMS: Thyroglobulin Antibody: <1 IU/mL (ref 0.0–0.9)

## 2021-10-31 LAB — THYROGLOBULIN BY IMA: Thyroglobulin by IMA: <0.1 ng/mL — ABNORMAL LOW (ref 1.5–38.5)

## 2021-11-02 DIAGNOSIS — K5730 Diverticulosis of large intestine without perforation or abscess without bleeding: Secondary | ICD-10-CM | POA: Diagnosis not present

## 2021-11-02 DIAGNOSIS — Z09 Encounter for follow-up examination after completed treatment for conditions other than malignant neoplasm: Secondary | ICD-10-CM | POA: Diagnosis not present

## 2021-11-02 DIAGNOSIS — Z83719 Family history of colon polyps, unspecified: Secondary | ICD-10-CM | POA: Diagnosis not present

## 2021-11-02 DIAGNOSIS — Z86010 Personal history of colonic polyps: Secondary | ICD-10-CM | POA: Diagnosis not present

## 2021-11-02 DIAGNOSIS — K649 Unspecified hemorrhoids: Secondary | ICD-10-CM | POA: Diagnosis not present

## 2021-11-02 DIAGNOSIS — K635 Polyp of colon: Secondary | ICD-10-CM | POA: Diagnosis not present

## 2021-11-03 ENCOUNTER — Ambulatory Visit: Payer: Medicare HMO

## 2021-11-03 LAB — CUP PACEART REMOTE DEVICE CHECK
Date Time Interrogation Session: 20231031064234
Implantable Lead Connection Status: 753985
Implantable Lead Connection Status: 753985
Implantable Lead Connection Status: 753985
Implantable Lead Implant Date: 20201026
Implantable Lead Implant Date: 20201026
Implantable Lead Implant Date: 20201026
Implantable Lead Location: 753858
Implantable Lead Location: 753859
Implantable Lead Location: 753860
Implantable Lead Model: 377
Implantable Lead Model: 377
Implantable Lead Model: 401183
Implantable Lead Serial Number: 81173669
Implantable Lead Serial Number: 81216766
Implantable Lead Serial Number: 81267228
Implantable Pulse Generator Implant Date: 20201026
Pulse Gen Model: 407137
Pulse Gen Serial Number: 69580491

## 2021-11-03 NOTE — Addendum Note (Signed)
Addended by: [name] on: 11/03/2021 04:51 PM   Modules accepted: Orders

## 2021-11-06 DIAGNOSIS — K635 Polyp of colon: Secondary | ICD-10-CM | POA: Diagnosis not present

## 2021-11-12 ENCOUNTER — Telehealth: Payer: Self-pay | Admitting: Pharmacist

## 2021-11-12 ENCOUNTER — Ambulatory Visit: Payer: Medicare HMO | Attending: Internal Medicine | Admitting: Student

## 2021-11-12 ENCOUNTER — Encounter: Payer: Self-pay | Admitting: Student

## 2021-11-12 VITALS — BP 120/78 | HR 68

## 2021-11-12 DIAGNOSIS — I420 Dilated cardiomyopathy: Secondary | ICD-10-CM | POA: Diagnosis not present

## 2021-11-12 DIAGNOSIS — I10 Essential (primary) hypertension: Secondary | ICD-10-CM

## 2021-11-12 MED ORDER — SPIRONOLACTONE 25 MG PO TABS: 12.5000 mg | ORAL_TABLET | Freq: Every day | ORAL | 3 refills | Status: DC

## 2021-11-12 NOTE — Progress Notes (Signed)
Patient ID: [name] [name]                 DOB: 1950-11-22                      MRN: [id number]     HPI: [name] [name] is a 71 y.o. female referred by Tommye Standard PA-C to pharmacy clinic for HF medication management. PMH is significant for NICM, hypertension, hyperlipidemia, thyroid cancer. Most recent LVEF 34-40% on 09/09/2021.   Today patient has no acute concern. Reports from past 30 days she has been taking Entresto only once a day instead of twice daily as twice daily dose caused really bad cough and  partly due to the higher cost too. Reports having bad allergy every year beginning of the fall season. So,was not sure that the cough was from the medication or due to seasonal change. Patient reports taking all other medications regularly and tolerating them well with out side effects. Patient reports not checking BP at home. Denies dizziness, fatigue, chest pain,swelling, SOB and comfortably able to complete all ADLs. She checks her weight at home and stays ~178 lbs. We discussed importance of HF medication optimization,importance of medication adherence, and patient engagement. Patient is in agreement to optimize medication and start new medication.    Current CHF meds: Entresto 24-26 mg twice daily, metoprolol Xl 25 mg daily   Adherence Assessment  Do you ever forget to take your medication? '[]'$ Yes '[x]'$ No  Do you ever skip doses due to side effects? '[x]'$ Yes '[]'$ No  Do you have trouble affording your medicines? '[x]'$ Yes '[]'$ No  Are you ever unable to pick up your medication due to transportation difficulties? '[]'$ Yes '[x]'$ No  Do you ever stop taking your medications because you don't believe they are helping? '[x]'$ Yes '[]'$ No  Do you check your weight daily? '[x]'$ Yes '[]'$ No   Adherence strategy: will check patient's eligibility for [place] to get assistance with high co-pay for brand name medications   Barriers to obtaining medications: cost   BP goal: 130/80  Family  History: mom, dad, siblings- heart disease, diabetes  Social History:  Tobacco: never Alcohol: none   Diet:  Eats whatever she likes to eat but mainly healthy low salt diet Breakfast-egg, toast ,coffee Lunch- special K  Dinner- eats out  taco bell chicken quesedia , [employer]  Encourage to eat home pre meals most days of the week.   Exercise: not doing reg exercise due to bad knee walk the dog, 15 min twice daily , do some yard work  Advertising copywriter to implement regular exercise schedule eg. Along with 15 min walk add some chair exercise 5 min and work your way up as per tolerability   Home BP readings: none (does not check at home)  Wt Readings from Last 3 Encounters:  10/29/21 171 lb 9.6 oz (77.8 kg)  10/05/21 174 lb (78.9 kg)  08/25/21 174 lb 6.4 oz (79.1 kg)   BP Readings from Last 3 Encounters:  11/12/21 120/78  10/29/21 108/66  10/05/21 118/66   Pulse Readings from Last 3 Encounters:  11/12/21 68  10/29/21 92  10/05/21 60    Renal function: CrCl cannot be calculated (Patient's most recent lab result is older than the maximum 21 days allowed.).  Past Medical History:  Diagnosis Date   Anxiety    because of parathyroid issues   Arthritis    hands   Asthma    x 2, triggered by allergies   Cancer (  [place])    melanoma removed from back, thyroid   Depression    bc of parathyroid problems   GERD (gastroesophageal reflux disease)    History of palpitations    none since 2016   Pre-diabetes    Seasonal allergies    Varicose vein     Current Outpatient Medications on File Prior to Visit  Medication Sig Dispense Refill   cetirizine (ZYRTEC) 10 MG tablet Take 10 mg by mouth daily.     ENTRESTO 24-26 MG TAKE 1 TABLET BY MOUTH TWICE A DAY 60 tablet 11   levothyroxine (SYNTHROID) 100 MCG tablet TAKE 1 TABLET BY MOUTH EVERY DAY 90 tablet 0   metoprolol succinate (TOPROL XL) 25 MG 24 hr tablet Take 1 tablet (25 mg total) by mouth daily. 30 tablet 3   minocycline  (MINOCIN,DYNACIN) 100 MG capsule Take 100 mg by mouth See admin instructions. Takes '100mg'$  daily for 7-10 days when she has "an outbreak".     omeprazole (PRILOSEC) 20 MG capsule Take 20 mg by mouth at bedtime.     Carboxymethylcellulose Sodium (THERATEARS OP) Place 1 drop into both eyes 2 (two) times daily. (Patient not taking: Reported on 11/12/2021)     diphenhydramine-acetaminophen (TYLENOL PM) 25-500 MG TABS tablet Take 1 tablet by mouth at bedtime as needed (sleep). (Patient not taking: Reported on 11/12/2021)     naproxen sodium (ALEVE) 220 MG tablet Take 440 mg by mouth daily as needed (headache/pain).      Current Facility-Administered Medications on File Prior to Visit  Medication Dose Route Frequency Provider Last Rate Last Admin   sodium chloride flush (NS) 0.9 % injection 3 mL  3 mL Intravenous Q12H Josue Hector, MD        Allergies  Allergen Reactions   Guaifenesin & Derivatives Nausea And Vomiting    Tremors, anxiety   Penicillins     Pt states it does not work Did it involve swelling of the face/tongue/throat, SOB, or low BP? No Did it involve sudden or severe rash/hives, skin peeling, or any reaction on the inside of your mouth or nose? No Did you need to seek medical attention at a hospital or doctor's office? No When did it last happen?      5 years If all above answers are "NO", may proceed with cephalosporin use.  Pt states it does not work Pt states it does not work Pt states it does not work Did it involve swelling of the face/tongue/throat, SOB, or low BP? No Did it involve sudden or severe rash/hives, skin peeling, or any reaction on the inside of your mouth or nose? No Did you need to seek medical attention at a hospital or doctor's office? No When did it last happen?      5 years If all above answers are "NO", may proceed with cephalosporin use.   Sulfamethoxazole Other (See Comments)    Pt states it doesn't work and she doesn't want to take it      Dilated cardiomyopathy ([place]) Assessment:  In office BP 120/78 : controlled ( goal <130/80) Patient reports her BP always stays in lower range denies dizziness from the low BP. Denies fatigue, chest pain,swelling, SOB and comfortably able to complete all ADLs Weight stays stable (home weight ~178)  Taking metoprolol regularly and tolerating it well but for Entresto self adjusted the dose from twice daily to once daily for last 30 days due to persistent cough and partially due to cost  Discussed importance  of HF medication optimization,importance of medication adherence, and patient engagement To optimize current regimen adding MRA and encouraged patient to switch back to twice daily dosing for Entresto   Patient is in agreement to add new agent and try to go back to twice daily dose for Entresto as we thinks cough could be related to season change If cough comes back, we can change Entresto to other ARBs   Plan: Continue taking Metoprolol Xl 25 mg daily  Start taking Entresto 24-26 mg twice daily  Start taking spironolactone 12.5 mg daily  Lab: BMP due in 2 weeks  Pharm D to check patient's eligibility for [place].    Thank you   Cammy Copa, Pharm.D [facility] A Division of [facility] [place] [address] [place], [place] [address]  Phone: [phone]; Fax: [phone]

## 2021-11-12 NOTE — Assessment & Plan Note (Signed)
Assessment:  In office BP 120/78 : controlled ( goal <130/80) Patient reports her BP always stays in lower range denies dizziness from the low BP. Denies fatigue, chest pain,swelling, SOB and comfortably able to complete all ADLs Weight stays stable (home weight ~178)  Taking metoprolol regularly and tolerating it well but for Entresto self adjusted the dose from twice daily to once daily for last 30 days due to persistent cough and partially due to cost  Discussed importance of HF medication optimization,importance of medication adherence, and patient engagement To optimize current regimen adding MRA and encouraged patient to switch back to twice daily dosing for Entresto   Patient is in agreement to add new agent and try to go back to twice daily dose for Entresto as we thinks cough could be related to season change If cough comes back, we can change Entresto to other ARBs   Plan: Continue taking Metoprolol Xl 25 mg daily  Start taking Entresto 24-26 mg twice daily  Start taking spironolactone 12.5 mg daily  Lab: BMP due in 2 weeks  Pharm D to check patient's eligibility for [place].

## 2021-11-12 NOTE — Patient Instructions (Signed)
Changes made by your pharmacist Cammy Copa, PharmD at today's visit:    Instructions/Changes  (what do you need to do) Your Notes  (what you did and when you did it)  1.start taking Entresto twice a day    2.start taking Spironolactone 12.5 mg daily   3. Lab due in 2 weeks       If you have any questions or concerns please use My Chart to send questions or call the office at [phone]

## 2021-11-12 NOTE — Telephone Encounter (Signed)
Call to inform that approved for [employer] grant till 10/13/2022  N/A, LVM to return the call    [place] ID [id number]  CARD NO. [id number]   CARD STATUS Active   BIN [id number]   PCN [id number]   PC GROUP [id number]

## 2021-11-15 NOTE — Progress Notes (Unsigned)
Office Visit    Patient Name: [name] [name] Date of Encounter: 11/15/2021  Primary Care Provider:  Caryl Bis, MD Primary Cardiologist:  None Primary Electrophysiologist: Cristopher Peru, MD  Chief Complaint    [name] [name] is a 71 y.o. female with PMH of HTN, HFpEF, NICM, LBBB, asthma thyroid cancer s/p thyroidectomy 2018, CHB s/p Biotronik PPM 10/2018 who presents today for follow-up of congestive heart failure.  Past Medical History    Past Medical History:  Diagnosis Date   Anxiety    because of parathyroid issues   Arthritis    hands   Asthma    x 2, triggered by allergies   Cancer ([place])    melanoma removed from back, thyroid   Depression    bc of parathyroid problems   GERD (gastroesophageal reflux disease)    History of palpitations    none since 2016   Pre-diabetes    Seasonal allergies    Varicose vein    Past Surgical History:  Procedure Laterality Date   BIV PACEMAKER INSERTION CRT-P N/A 10/30/2018   Procedure: BIV PACEMAKER INSERTION CRT-P;  Surgeon: Evans Lance, MD;  Location: [facility];  Service: Cardiovascular;  Laterality: N/A;   CARPAL TUNNEL RELEASE Right 02/05/2014   Procedure: RIGHT CARPAL TUNNEL RELEASE;  Surgeon: Jessy Oto, MD;  Location: [facility];  Service: Orthopedics;  Laterality: Right;   COLONOSCOPY     EYE SURGERY     Bilateral Lasik   LAPAROSCOPIC ASSISTED VAGINAL HYSTERECTOMY Bilateral 10/11/2013   Procedure: LAPAROSCOPIC ASSISTED VAGINAL HYSTERECTOMY BILATERAL SALPINGO OOPHORECTOMY;  Surgeon: Allena Katz, MD;  Location: [facility];  Service: Gynecology;  Laterality: Bilateral;   Malignant melanoma resected     lower back area   PARATHYROIDECTOMY Left 08/24/2016   Procedure: NECK EXPLORATION LEFT SUPERIOR PARATHYROIDECTOMY LEFT LOBE THYROIDECTOMY;  Surgeon: Armandina Gemma, MD;  Location: [facility];  Service: General;  Laterality: Left;   RIGHT/LEFT HEART CATH AND CORONARY ANGIOGRAPHY N/A  01/15/2019   Procedure: RIGHT/LEFT HEART CATH AND CORONARY ANGIOGRAPHY;  Surgeon: Belva Crome, MD;  Location: [facility];  Service: Cardiovascular;  Laterality: N/A;   THYROIDECTOMY N/A 12/09/2016   Procedure: COMPLETION THYROIDECTOMY RIGHT LOBE;  Surgeon: Armandina Gemma, MD;  Location: [facility];  Service: General;  Laterality: N/A;   trigger thumb surgery     right   TUBAL LIGATION     VARICOSE VEIN SURGERY     leg    Allergies  Allergies  Allergen Reactions   Guaifenesin & Derivatives Nausea And Vomiting    Tremors, anxiety   Penicillins     Pt states it does not work Did it involve swelling of the face/tongue/throat, SOB, or low BP? No Did it involve sudden or severe rash/hives, skin peeling, or any reaction on the inside of your mouth or nose? No Did you need to seek medical attention at a hospital or doctor's office? No When did it last happen?      5 years If all above answers are "NO", may proceed with cephalosporin use.  Pt states it does not work Pt states it does not work Pt states it does not work Did it involve swelling of the face/tongue/throat, SOB, or low BP? No Did it involve sudden or severe rash/hives, skin peeling, or any reaction on the inside of your mouth or nose? No Did you need to seek medical attention at a hospital or doctor's office? No When  did it last happen?      5 years If all above answers are "NO", may proceed with cephalosporin use.   Sulfamethoxazole Other (See Comments)    Pt states it doesn't work and she doesn't want to take it     History of Present Illness    [name] [name]  is a 71 year old female with the above mention past medical history who presents today for follow-up of CHF.  She was initially seen by Dr. Percival Spanish in 2010 for complaint of chest pain.  She had a stress perfusion study completed that showed no evidence of ischemia.  She was seen again in 2016 for complaint of palpitations and underwent treadmill stress test  that was negative with calcium score of 0 and was given as needed Cardizem for management.  She was then seen by Dr. Johnsie Cancel in 2020 for inpatient consultation for syncope, palpitations and new LBBB.  She was referred to Dr. Lovena Le for symptomatic bradycardia and underwent Biotronik PPM placement 10/30/18.She underwent a Lexiscan Myoview to rule out new DCM that was abnormal had R/LHC performed that revealed normal heart pressures with mild to moderate LV systolic dysfunction and normal coronaries.  She had 2D echo completed 2021 that showed EF 50-55%, no RWMA, grade 1 DD with no valvular abnormalities.  She was seen 08/2021 for follow-up and had complaints of tiredness and shortness of breath.  2D echo was completed and showed decreased EF of 35-40% with global hypokinesis, grade 1 DD with normal RV and no significant VHD.  She was started on Toprol and seen by Pharm.D. for HF medication management.  She was taking her Entresto once daily instead of twice due to bad cough.  She is also noted seasonal allergies with the change in weather.  She was started on spironolactone and continued Entresto twice daily with Toprol.  Since last being seen in the office patient reports***.  Patient denies chest pain, palpitations, dyspnea, PND, orthopnea, nausea, vomiting, dizziness, syncope, edema, weight gain, or early satiety.     ***Notes:  Home Medications    Current Outpatient Medications  Medication Sig Dispense Refill   Carboxymethylcellulose Sodium (THERATEARS OP) Place 1 drop into both eyes 2 (two) times daily. (Patient not taking: Reported on 11/12/2021)     cetirizine (ZYRTEC) 10 MG tablet Take 10 mg by mouth daily.     diphenhydramine-acetaminophen (TYLENOL PM) 25-500 MG TABS tablet Take 1 tablet by mouth at bedtime as needed (sleep). (Patient not taking: Reported on 11/12/2021)     ENTRESTO 24-26 MG TAKE 1 TABLET BY MOUTH TWICE A DAY 60 tablet 11   levothyroxine (SYNTHROID) 100 MCG tablet TAKE 1 TABLET  BY MOUTH EVERY DAY 90 tablet 0   metoprolol succinate (TOPROL XL) 25 MG 24 hr tablet Take 1 tablet (25 mg total) by mouth daily. 30 tablet 3   minocycline (MINOCIN,DYNACIN) 100 MG capsule Take 100 mg by mouth See admin instructions. Takes '100mg'$  daily for 7-10 days when she has "an outbreak".     naproxen sodium (ALEVE) 220 MG tablet Take 440 mg by mouth daily as needed (headache/pain).      omeprazole (PRILOSEC) 20 MG capsule Take 20 mg by mouth at bedtime.     spironolactone (ALDACTONE) 25 MG tablet Take 0.5 tablets (12.5 mg total) by mouth daily. 45 tablet 3   Current Facility-Administered Medications  Medication Dose Route Frequency Provider Last Rate Last Admin   sodium chloride flush (NS) 0.9 % injection 3 mL  3 mL Intravenous Q12H Josue Hector, MD         Review of Systems  Please see the history of present illness.    (+)*** (+)***  All other systems reviewed and are otherwise negative except as noted above.  Physical Exam    Wt Readings from Last 3 Encounters:  10/29/21 171 lb 9.6 oz (77.8 kg)  10/05/21 174 lb (78.9 kg)  08/25/21 174 lb 6.4 oz (79.1 kg)   AY:TKZSW were no vitals filed for this visit.,There is no height or weight on file to calculate BMI.  Constitutional:      Appearance: Healthy appearance. Not in distress.  Neck:     Vascular: JVD normal.  Pulmonary:     Effort: Pulmonary effort is normal.     Breath sounds: No wheezing. No rales. Diminished in the bases Cardiovascular:     Normal rate. Regular rhythm. Normal S1. Normal S2.      Murmurs: There is no murmur.  Edema:    Peripheral edema absent.  Abdominal:     Palpations: Abdomen is soft non tender. There is no hepatomegaly.  Skin:    General: Skin is warm and dry.  Neurological:     General: No focal deficit present.     Mental Status: Alert and oriented to person, place and time.     Cranial Nerves: Cranial nerves are intact.  EKG/LABS/Other Studies Reviewed    ECG personally reviewed by  me today - ***  Risk Assessment/Calculations:   {Does this patient have ATRIAL FIBRILLATION?:[phone]}        Lab Results  Component Value Date   WBC 7.4 01/11/2019   HGB 11.9 (L) 01/15/2019   HCT 35.0 (L) 01/15/2019   MCV 89.4 01/11/2019   PLT 254 01/11/2019   Lab Results  Component Value Date   CREATININE 0.74 08/25/2021   BUN 10 08/25/2021   NA 142 08/25/2021   K 4.2 08/25/2021   CL 102 08/25/2021   CO2 24 08/25/2021   Lab Results  Component Value Date   ALT 18 09/14/2018   AST 17 09/14/2018   ALKPHOS 60 09/14/2018   BILITOT 0.3 09/14/2018   No results found for: "CHOL", "HDL", "LDLCALC", "LDLDIRECT", "TRIG", "CHOLHDL"  Lab Results  Component Value Date   HGBA1C 6.1 (H) 12/06/2016    Assessment & Plan    1.  HFrEF/NICM: -Most recent 2D echo with EF of 35-40% with global hypokinesis, grade 1 DD with normal RV and no significant VHD. -Patient endorsed fatigue and shortness of breath at previous EP visit and today reports*** -GDMT was optimized by Pharm.D. with addition of spironolactone 25 mg.  Continue Entresto and Toprol-XL -  2.  History of CHB: -s/p Biotronik PPM placed 10/2018 -Currently followed by Dr. Lovena Le with normal device function  3.  Essential hypertension: -Patient's blood pressure today was*** -Continue current regimen  4.  History of thyroid CA: -s/p thyroidectomy 2018 currently in remission      Disposition: Follow-up with None or APP in *** months {Are you ordering a CV Procedure (e.g. stress test, cath, DCCV, TEE, etc)?   Press F2        :[id number]}   Medication Adjustments/Labs and Tests Ordered: Current medicines are reviewed at length with the patient today.  Concerns regarding medicines are outlined above.   Signed, Mable Fill, Marissa Nestle, NP 11/15/2021, 1:26 PM [facility]  Note:  This document was prepared using Dragon voice recognition software and  may include unintentional dictation  errors.

## 2021-11-16 ENCOUNTER — Encounter: Payer: Self-pay | Admitting: Nurse Practitioner

## 2021-11-16 ENCOUNTER — Ambulatory Visit: Payer: Medicare HMO | Attending: Nurse Practitioner | Admitting: Nurse Practitioner

## 2021-11-16 VITALS — BP 100/50 | HR 82 | Ht 64.5 in | Wt 172.0 lb

## 2021-11-16 DIAGNOSIS — C73 Malignant neoplasm of thyroid gland: Secondary | ICD-10-CM

## 2021-11-16 DIAGNOSIS — I420 Dilated cardiomyopathy: Secondary | ICD-10-CM | POA: Diagnosis not present

## 2021-11-16 DIAGNOSIS — I10 Essential (primary) hypertension: Secondary | ICD-10-CM

## 2021-11-16 DIAGNOSIS — I442 Atrioventricular block, complete: Secondary | ICD-10-CM

## 2021-11-16 NOTE — Patient Instructions (Addendum)
Medication Instructions:  Your physician recommends that you continue on your current medications as directed. Please refer to the Current Medication list given to you today. *If you need a refill on your cardiac medications before your next appointment, please call your pharmacy*   Lab Work: NONE ORDERED   Testing/Procedures: NONE ORDERED   Follow-Up: At [facility], you and your health needs are our priority.  As part of our continuing mission to provide you with exceptional heart care, we have created designated Provider Care Teams.  These Care Teams include your primary Cardiologist (physician) and Advanced Practice Providers (APPs -  Physician Assistants and Nurse Practitioners) who all work together to provide you with the care you need, when you need it.  We recommend signing up for the patient portal called "MyChart".  Sign up information is provided on this After Visit Summary.  MyChart is used to connect with patients for Virtual Visits (Telemedicine).  Patients are able to view lab/test results, encounter notes, upcoming appointments, etc.  Non-urgent messages can be sent to your provider as well.   To learn more about what you can do with MyChart, go to [email or web address].    Your next appointment:   12 month(s)  The format for your next appointment:   In Person  Provider:   Josue Hector, MD   Other Instructions   Important Information About [place]

## 2021-11-16 NOTE — Telephone Encounter (Signed)
Patient called back and LVM

## 2021-11-19 ENCOUNTER — Ambulatory Visit: Payer: Medicare HMO | Attending: Cardiovascular Disease

## 2021-11-19 DIAGNOSIS — H04123 Dry eye syndrome of bilateral lacrimal glands: Secondary | ICD-10-CM | POA: Diagnosis not present

## 2021-11-19 DIAGNOSIS — I10 Essential (primary) hypertension: Secondary | ICD-10-CM | POA: Diagnosis not present

## 2021-11-20 LAB — BASIC METABOLIC PANEL
BUN/Creatinine Ratio: 13 (ref 12–28)
BUN: 10 mg/dL (ref 8–27)
CO2: 25 mmol/L (ref 20–29)
Calcium: 8.5 mg/dL — ABNORMAL LOW (ref 8.7–10.3)
Chloride: 102 mmol/L (ref 96–106)
Creatinine, Ser: 0.78 mg/dL (ref 0.57–1.00)
Glucose: 124 mg/dL — ABNORMAL HIGH (ref 70–99)
Potassium: 4 mmol/L (ref 3.5–5.2)
Sodium: 141 mmol/L (ref 134–144)
eGFR: 81 mL/min/{1.73_m2} (ref >59)

## 2021-11-25 NOTE — Telephone Encounter (Signed)
Patient has been informed about [name] [name] approval and provided the card information via phone.

## 2021-12-07 ENCOUNTER — Other Ambulatory Visit: Payer: Self-pay | Admitting: Physician Assistant

## 2021-12-10 DIAGNOSIS — J0100 Acute maxillary sinusitis, unspecified: Secondary | ICD-10-CM | POA: Diagnosis not present

## 2021-12-10 DIAGNOSIS — B9689 Other specified bacterial agents as the cause of diseases classified elsewhere: Secondary | ICD-10-CM | POA: Diagnosis not present

## 2021-12-10 DIAGNOSIS — R051 Acute cough: Secondary | ICD-10-CM | POA: Diagnosis not present

## 2021-12-10 DIAGNOSIS — J988 Other specified respiratory disorders: Secondary | ICD-10-CM | POA: Diagnosis not present

## 2021-12-25 ENCOUNTER — Ambulatory Visit: Payer: Medicare HMO | Attending: Cardiology | Admitting: Pharmacist

## 2021-12-25 VITALS — BP 98/60 | HR 64 | Wt 169.5 lb

## 2021-12-25 DIAGNOSIS — I5022 Chronic systolic (congestive) heart failure: Secondary | ICD-10-CM | POA: Diagnosis not present

## 2021-12-25 NOTE — Patient Instructions (Addendum)
Your sister can try calling the [employer] grant to see if they have funding in their chronic heart failure fund for her [facility] medicine. Their phone # is [phone]  Continue taking your current medications  You're due for follow up in May 2024. We'll add on an echo around that time to reassess the pumping function of your heart  Call clinic with any questions or concerns before then

## 2021-12-25 NOTE — Progress Notes (Signed)
Patient ID: [name]                 DOB: [birth year]                      MRN: [id number]     HPI: [name] [name] is a 71 y.o. female referred by [name] Standard PA-C to pharmacy clinic for HF medication management. PMH is significant for NICM and CHF, CHB with PPM, HTN, HLD, and thyroid cancer. Most recent LVEF 35-40% on 09/09/2021. At that time, she was started on Toprol '25mg'$  daily and referred to PharmD for further med management. Seen by PharmD on 11/9. BP well controlled at 120/78 however had been taking Entresto once daily due to cost and cough. Does also get allergies in the fall and wasn't sure what cough was coming from. She was advised to start taking Entresto twice daily and was also started on spironolactone. She was also enrolled in the [facility] to help with [name] cost. F/u BMET 1 week later was stable. Saw [name] Pancoast, NP 4 days after that visit and BP was lower at 100/50. Pt was euvolemic, tolerating meds well, denied dizziness and SOB. Still reported dry cough but was present since before starting Entresto, encouraged to follow up with PCP, no med changes made. Went to urgent care 12/7 for cough and given rx for benzonatate and Zpak.   Patient presents today for follow up. Reports not checking BP at home but does bring in her Omron bicep cuff today. Denies dizziness, fatigue, chest pain, swelling, SOB and comfortably able to complete all ADLs. She checks her weight at home which has been consistent. Entresto grant through [employer] worked for her at [employer]. Benzonatate and Zpak did not help with her cough. Takes meds at 9am but didn't take her doses yet today, about an hour late from usual admin time.  Home cuff reading: 99/63, HR 67 Clinic reading: 98/60, HR 64  Current CHF meds: Entresto 24-'26mg'$  BID (9am, 6pm), Toprol '25mg'$  daily (9am), spironolactone 12.'5mg'$  daily (9am)   BP goal: 130/52mHg   Family History: mom, dad, siblings- heart  disease, diabetes   Social History: no tobacco or alcohol use   Diet:  Eats whatever she likes to eat but mainly healthy low salt diet Breakfast-egg, toast, coffee Lunch- special K  Dinner- eats out  [employer] chicken quesedia , [employer]  Encourage to eat home pre meals most days of the week.    Exercise: not doing reg exercise due to bad knee walk the dog, 15 min twice daily , do some yard work  [employer] copywriterto implement regular exercise schedule eg. Along with 15 min walk add some chair exercise 5 min and work your way up as per tolerability    Home BP readings: none (does not check at home)  Wt Readings from Last 3 Encounters:  11/16/21 172 lb (78 kg)  10/29/21 171 lb 9.6 oz (77.8 kg)  10/05/21 174 lb (78.9 kg)   BP Readings from Last 3 Encounters:  11/16/21 (!) 100/50  11/12/21 120/78  10/29/21 108/66   Pulse Readings from Last 3 Encounters:  11/16/21 82  11/12/21 68  10/29/21 92    Renal function: CrCl cannot be calculated (Patient's most recent lab result is older than the maximum 21 days allowed.).  Past Medical History:  Diagnosis Date   Anxiety    because of parathyroid issues   Arthritis    hands  Asthma    x 2, triggered by allergies   Cancer ([place])    melanoma removed from back, thyroid   Depression    bc of parathyroid problems   GERD (gastroesophageal reflux disease)    History of palpitations    none since 2016   Pre-diabetes    Seasonal allergies    Varicose vein     Current Outpatient Medications on File Prior to Visit  Medication Sig Dispense Refill   Ascorbic Acid (VITAMIN C) 1000 MG tablet Take 1,000 mg by mouth 3 (three) times a week. (Gummies)     BIOTIN PO Patient take 2 gummies by mouth daily     cetirizine (ZYRTEC) 10 MG tablet Take 10 mg by mouth daily.     ELDERBERRY PO Patient take 2 gummies by mouth daily     ENTRESTO 24-26 MG TAKE 1 TABLET BY MOUTH TWICE A DAY 60 tablet 11   levothyroxine (SYNTHROID) 100 MCG tablet TAKE 1  TABLET BY MOUTH EVERY DAY 90 tablet 0   metoprolol succinate (TOPROL-XL) 25 MG 24 hr tablet TAKE 1 TABLET (25 MG TOTAL) BY MOUTH DAILY. 90 tablet 1   minocycline (MINOCIN,DYNACIN) 100 MG capsule Take 100 mg by mouth See admin instructions. Takes '100mg'$  daily for 7-10 days when she has "an outbreak".     naproxen sodium (ALEVE) 220 MG tablet Take 440 mg by mouth daily as needed (headache/pain).      omeprazole (PRILOSEC) 20 MG capsule Take 20 mg by mouth at bedtime.     spironolactone (ALDACTONE) 25 MG tablet Take 0.5 tablets (12.5 mg total) by mouth daily. 45 tablet 3   Current Facility-Administered Medications on File Prior to Visit  Medication Dose Route Frequency Provider Last Rate Last Admin   sodium chloride flush (NS) 0.9 % injection 3 mL  3 mL Intravenous Q12H Josue Hector, MD        Allergies  Allergen Reactions   Guaifenesin & Derivatives Nausea And Vomiting    Tremors, anxiety   Penicillins     Pt states it does not work Did it involve swelling of the face/tongue/throat, SOB, or low BP? No Did it involve sudden or severe rash/hives, skin peeling, or any reaction on the inside of your mouth or nose? No Did you need to seek medical attention at a hospital or doctor's office? No When did it last happen?      5 years If all above answers are "NO", may proceed with cephalosporin use.  Pt states it does not work Pt states it does not work Pt states it does not work Did it involve swelling of the face/tongue/throat, SOB, or low BP? No Did it involve sudden or severe rash/hives, skin peeling, or any reaction on the inside of your mouth or nose? No Did you need to seek medical attention at a hospital or doctor's office? No When did it last happen?      5 years If all above answers are "NO", may proceed with cephalosporin use.   Sulfamethoxazole Other (See Comments)    Pt states it doesn't work and she doesn't want to take it      Assessment/Plan:  1. CHF - BP soft today, at  goal < 130/59mHg. Unable to titrate CHF meds based on BP which is consistent compared to her last office visit as well. Her home cuff is measuring accurately. Will continue Entresto 24-'26mg'$  BID, Toprol '25mg'$  daily, and spironolactone 12.'5mg'$  daily. Cost has been an issue for branded  meds, now in [place] for her Entresto. Will hold off on SGLT2i given SBP just under 100. She is due for follow up with APP next May. Will plan to recheck echo at that time. Pt counseled on s/sx of low BP today and advised to call clinic with any concerns before her next appt.  Shaydon Lease E. Omaira Mellen, PharmD, BCACP, [place] [facility]. [address] [place],  [address] Phone: [phone]; Fax: [phone] 12/25/2021 10:29 AM

## 2021-12-26 DIAGNOSIS — R0981 Nasal congestion: Secondary | ICD-10-CM | POA: Diagnosis not present

## 2021-12-26 DIAGNOSIS — B9689 Other specified bacterial agents as the cause of diseases classified elsewhere: Secondary | ICD-10-CM | POA: Diagnosis not present

## 2021-12-26 DIAGNOSIS — J988 Other specified respiratory disorders: Secondary | ICD-10-CM | POA: Diagnosis not present

## 2021-12-26 DIAGNOSIS — R509 Fever, unspecified: Secondary | ICD-10-CM | POA: Diagnosis not present

## 2022-01-06 ENCOUNTER — Other Ambulatory Visit (INDEPENDENT_AMBULATORY_CARE_PROVIDER_SITE_OTHER): Payer: Medicare HMO

## 2022-01-06 DIAGNOSIS — E890 Postprocedural hypothyroidism: Secondary | ICD-10-CM | POA: Diagnosis not present

## 2022-01-06 DIAGNOSIS — E559 Vitamin D deficiency, unspecified: Secondary | ICD-10-CM

## 2022-01-06 LAB — T4, FREE: Free T4: 1.18 ng/dL (ref 0.60–1.60)

## 2022-01-06 LAB — TSH: TSH: 0.8 u[IU]/mL (ref 0.35–5.50)

## 2022-01-06 LAB — VITAMIN D 25 HYDROXY (VIT D DEFICIENCY, FRACTURES): VITD: 35.13 ng/mL (ref 30.00–100.00)

## 2022-01-22 ENCOUNTER — Other Ambulatory Visit: Payer: Self-pay | Admitting: Internal Medicine

## 2022-02-01 DIAGNOSIS — Z13220 Encounter for screening for lipoid disorders: Secondary | ICD-10-CM | POA: Diagnosis not present

## 2022-02-01 DIAGNOSIS — K219 Gastro-esophageal reflux disease without esophagitis: Secondary | ICD-10-CM | POA: Diagnosis not present

## 2022-02-01 DIAGNOSIS — E210 Primary hyperparathyroidism: Secondary | ICD-10-CM | POA: Diagnosis not present

## 2022-02-01 DIAGNOSIS — R7301 Impaired fasting glucose: Secondary | ICD-10-CM | POA: Diagnosis not present

## 2022-02-01 DIAGNOSIS — Z85850 Personal history of malignant neoplasm of thyroid: Secondary | ICD-10-CM | POA: Diagnosis not present

## 2022-02-01 DIAGNOSIS — Z0001 Encounter for general adult medical examination with abnormal findings: Secondary | ICD-10-CM | POA: Diagnosis not present

## 2022-02-02 ENCOUNTER — Ambulatory Visit: Payer: Medicare HMO

## 2022-02-02 DIAGNOSIS — I442 Atrioventricular block, complete: Secondary | ICD-10-CM

## 2022-02-02 LAB — CUP PACEART REMOTE DEVICE CHECK
Date Time Interrogation Session: 20240130074747
Implantable Lead Connection Status: 753985
Implantable Lead Connection Status: 753985
Implantable Lead Connection Status: 753985
Implantable Lead Implant Date: 20201026
Implantable Lead Implant Date: 20201026
Implantable Lead Implant Date: 20201026
Implantable Lead Location: 753858
Implantable Lead Location: 753859
Implantable Lead Location: 753860
Implantable Lead Model: 377
Implantable Lead Model: 377
Implantable Lead Model: 401183
Implantable Lead Serial Number: 81173669
Implantable Lead Serial Number: 81216766
Implantable Lead Serial Number: 81267228
Implantable Pulse Generator Implant Date: 20201026
Pulse Gen Model: 407137
Pulse Gen Serial Number: 69580491

## 2022-02-04 DIAGNOSIS — L718 Other rosacea: Secondary | ICD-10-CM | POA: Diagnosis not present

## 2022-02-04 DIAGNOSIS — E039 Hypothyroidism, unspecified: Secondary | ICD-10-CM | POA: Diagnosis not present

## 2022-02-04 DIAGNOSIS — I519 Heart disease, unspecified: Secondary | ICD-10-CM | POA: Diagnosis not present

## 2022-02-04 DIAGNOSIS — Z6828 Body mass index (BMI) 28.0-28.9, adult: Secondary | ICD-10-CM | POA: Diagnosis not present

## 2022-02-04 DIAGNOSIS — Z85850 Personal history of malignant neoplasm of thyroid: Secondary | ICD-10-CM | POA: Diagnosis not present

## 2022-02-04 DIAGNOSIS — Z0001 Encounter for general adult medical examination with abnormal findings: Secondary | ICD-10-CM | POA: Diagnosis not present

## 2022-02-04 DIAGNOSIS — R7301 Impaired fasting glucose: Secondary | ICD-10-CM | POA: Diagnosis not present

## 2022-02-04 DIAGNOSIS — J301 Allergic rhinitis due to pollen: Secondary | ICD-10-CM | POA: Diagnosis not present

## 2022-02-04 DIAGNOSIS — K2100 Gastro-esophageal reflux disease with esophagitis, without bleeding: Secondary | ICD-10-CM | POA: Diagnosis not present

## 2022-02-04 DIAGNOSIS — R030 Elevated blood-pressure reading, without diagnosis of hypertension: Secondary | ICD-10-CM | POA: Diagnosis not present

## 2022-02-05 NOTE — Telephone Encounter (Signed)
Accessed chart to close the encounter.

## 2022-02-08 DIAGNOSIS — I442 Atrioventricular block, complete: Secondary | ICD-10-CM | POA: Diagnosis not present

## 2022-02-08 DIAGNOSIS — Z0001 Encounter for general adult medical examination with abnormal findings: Secondary | ICD-10-CM | POA: Diagnosis not present

## 2022-02-08 DIAGNOSIS — E039 Hypothyroidism, unspecified: Secondary | ICD-10-CM | POA: Diagnosis not present

## 2022-02-08 DIAGNOSIS — R69 Illness, unspecified: Secondary | ICD-10-CM | POA: Diagnosis not present

## 2022-02-08 DIAGNOSIS — F17210 Nicotine dependence, cigarettes, uncomplicated: Secondary | ICD-10-CM | POA: Diagnosis not present

## 2022-02-08 DIAGNOSIS — R030 Elevated blood-pressure reading, without diagnosis of hypertension: Secondary | ICD-10-CM | POA: Diagnosis not present

## 2022-02-08 DIAGNOSIS — I519 Heart disease, unspecified: Secondary | ICD-10-CM | POA: Diagnosis not present

## 2022-02-08 DIAGNOSIS — Z6828 Body mass index (BMI) 28.0-28.9, adult: Secondary | ICD-10-CM | POA: Diagnosis not present

## 2022-02-26 NOTE — Progress Notes (Signed)
Remote pacemaker transmission.   

## 2022-03-09 ENCOUNTER — Telehealth: Payer: Self-pay | Admitting: Cardiovascular Disease

## 2022-03-09 NOTE — Telephone Encounter (Signed)
*  STAT* If patient is at the pharmacy, call can be transferred to refill team.   1. Which medications need to be refilled? (please list name of each medication and dose if known) ENTRESTO 24-26 MG   2. Which pharmacy/location (including street and city if local pharmacy) is medication to be sent to?  [id number] [place], [place] - [address]   3. Do they need a 30 day or 90 day supply? 30 day

## 2022-03-09 NOTE — Telephone Encounter (Signed)
Completed.

## 2022-04-09 ENCOUNTER — Telehealth: Payer: Self-pay | Admitting: Cardiovascular Disease

## 2022-04-09 MED ORDER — ENTRESTO 24-26 MG PO TABS: 1.0000 | ORAL_TABLET | Freq: Two times a day (BID) | ORAL | 1 refills | Status: DC

## 2022-04-09 NOTE — Telephone Encounter (Signed)
*  STAT* If patient is at the pharmacy, call can be transferred to refill team.   1. Which medications need to be refilled? (please list name of each medication and dose if known)   sacubitril-valsartan (ENTRESTO) 24-26 MG   TAKE 1 TABLET BY MOUTH TWICE A DAY  2. Which pharmacy/location (including street and city if local pharmacy) is medication to be sent to? [id number] [place], [place] - [address]     3. Do they need a 30 day or 90 day supply?90 Day Supply

## 2022-04-09 NOTE — Telephone Encounter (Signed)
Pt's medication was sent to pt's pharmacy as requested. Confirmation received.  °

## 2022-05-04 ENCOUNTER — Ambulatory Visit (INDEPENDENT_AMBULATORY_CARE_PROVIDER_SITE_OTHER): Payer: Medicare HMO

## 2022-05-04 DIAGNOSIS — I442 Atrioventricular block, complete: Secondary | ICD-10-CM

## 2022-05-05 LAB — CUP PACEART REMOTE DEVICE CHECK
Battery Voltage: 60
Date Time Interrogation Session: 20240430072312
Implantable Lead Connection Status: 753985
Implantable Lead Connection Status: 753985
Implantable Lead Connection Status: 753985
Implantable Lead Implant Date: 20201026
Implantable Lead Implant Date: 20201026
Implantable Lead Implant Date: 20201026
Implantable Lead Location: 753858
Implantable Lead Location: 753859
Implantable Lead Location: 753860
Implantable Lead Model: 377
Implantable Lead Model: 377
Implantable Lead Model: 401183
Implantable Lead Serial Number: 81173669
Implantable Lead Serial Number: 81216766
Implantable Lead Serial Number: 81267228
Implantable Pulse Generator Implant Date: 20201026
Pulse Gen Model: 407137
Pulse Gen Serial Number: 69580491

## 2022-05-26 NOTE — Progress Notes (Signed)
Remote pacemaker transmission.   

## 2022-06-03 ENCOUNTER — Other Ambulatory Visit: Payer: Self-pay | Admitting: Physician Assistant

## 2022-08-02 ENCOUNTER — Other Ambulatory Visit: Payer: Self-pay | Admitting: Cardiovascular Disease

## 2022-08-03 ENCOUNTER — Ambulatory Visit (INDEPENDENT_AMBULATORY_CARE_PROVIDER_SITE_OTHER): Payer: Medicare HMO

## 2022-08-03 DIAGNOSIS — I442 Atrioventricular block, complete: Secondary | ICD-10-CM

## 2022-08-03 DIAGNOSIS — I5022 Chronic systolic (congestive) heart failure: Secondary | ICD-10-CM

## 2022-08-03 LAB — CUP PACEART REMOTE DEVICE CHECK
Battery Voltage: 60
Date Time Interrogation Session: 20240730084418
Implantable Lead Connection Status: 753985
Implantable Lead Connection Status: 753985
Implantable Lead Connection Status: 753985
Implantable Lead Implant Date: 20201026
Implantable Lead Implant Date: 20201026
Implantable Lead Implant Date: 20201026
Implantable Lead Location: 753858
Implantable Lead Location: 753859
Implantable Lead Location: 753860
Implantable Lead Model: 377
Implantable Lead Model: 377
Implantable Lead Model: 401183
Implantable Lead Serial Number: 81173669
Implantable Lead Serial Number: 81216766
Implantable Lead Serial Number: 81267228
Implantable Pulse Generator Implant Date: 20201026
Pulse Gen Model: 407137
Pulse Gen Serial Number: 69580491

## 2022-08-04 DIAGNOSIS — K219 Gastro-esophageal reflux disease without esophagitis: Secondary | ICD-10-CM | POA: Diagnosis not present

## 2022-08-04 DIAGNOSIS — R7301 Impaired fasting glucose: Secondary | ICD-10-CM | POA: Diagnosis not present

## 2022-08-04 DIAGNOSIS — Z13220 Encounter for screening for lipoid disorders: Secondary | ICD-10-CM | POA: Diagnosis not present

## 2022-08-04 DIAGNOSIS — E039 Hypothyroidism, unspecified: Secondary | ICD-10-CM | POA: Diagnosis not present

## 2022-08-05 LAB — LAB REPORT - SCANNED
A1c: 6.5
EGFR: 70

## 2022-08-11 DIAGNOSIS — E039 Hypothyroidism, unspecified: Secondary | ICD-10-CM | POA: Diagnosis not present

## 2022-08-11 DIAGNOSIS — Z6828 Body mass index (BMI) 28.0-28.9, adult: Secondary | ICD-10-CM | POA: Diagnosis not present

## 2022-08-11 DIAGNOSIS — K2100 Gastro-esophageal reflux disease with esophagitis, without bleeding: Secondary | ICD-10-CM | POA: Diagnosis not present

## 2022-08-11 DIAGNOSIS — Z9189 Other specified personal risk factors, not elsewhere classified: Secondary | ICD-10-CM | POA: Diagnosis not present

## 2022-08-11 DIAGNOSIS — J301 Allergic rhinitis due to pollen: Secondary | ICD-10-CM | POA: Diagnosis not present

## 2022-08-11 DIAGNOSIS — R7301 Impaired fasting glucose: Secondary | ICD-10-CM | POA: Diagnosis not present

## 2022-08-11 DIAGNOSIS — L718 Other rosacea: Secondary | ICD-10-CM | POA: Diagnosis not present

## 2022-08-11 DIAGNOSIS — R030 Elevated blood-pressure reading, without diagnosis of hypertension: Secondary | ICD-10-CM | POA: Diagnosis not present

## 2022-08-11 DIAGNOSIS — Z85850 Personal history of malignant neoplasm of thyroid: Secondary | ICD-10-CM | POA: Diagnosis not present

## 2022-08-11 DIAGNOSIS — I519 Heart disease, unspecified: Secondary | ICD-10-CM | POA: Diagnosis not present

## 2022-08-13 ENCOUNTER — Other Ambulatory Visit: Payer: Self-pay | Admitting: Internal Medicine

## 2022-08-25 NOTE — Progress Notes (Signed)
Remote pacemaker transmission.   

## 2022-10-15 ENCOUNTER — Other Ambulatory Visit: Payer: Self-pay | Admitting: Nurse Practitioner

## 2022-10-18 ENCOUNTER — Ambulatory Visit: Payer: Medicare HMO | Attending: Internal Medicine | Admitting: Internal Medicine

## 2022-10-18 ENCOUNTER — Encounter: Payer: Self-pay | Admitting: Internal Medicine

## 2022-10-18 VITALS — BP 110/60 | HR 60 | Ht 64.5 in | Wt 171.4 lb

## 2022-10-18 DIAGNOSIS — I442 Atrioventricular block, complete: Secondary | ICD-10-CM | POA: Diagnosis not present

## 2022-10-18 NOTE — Patient Instructions (Signed)
Medication Instructions:  Your physician recommends that you continue on your current medications as directed. Please refer to the Current Medication list given to you today.  *If you need a refill on your cardiac medications before your next appointment, please call your pharmacy*  Lab Work: None ordered.  If you have labs (blood work) drawn today and your tests are completely normal, you will receive your results only by: MyChart Message (if you have MyChart) OR A paper copy in the mail If you have any lab test that is abnormal or we need to change your treatment, we will call you to review the results.  Testing/Procedures: None ordered.  Follow-Up: At [facility], you and your health needs are our priority.  As part of our continuing mission to provide you with exceptional heart care, we have created designated Provider Care Teams.  These Care Teams include your primary Cardiologist (physician) and Advanced Practice Providers (APPs -  Physician Assistants and Nurse Practitioners) who all work together to provide you with the care you need, when you need it.  We recommend signing up for the patient portal called "MyChart".  Sign up information is provided on this After Visit Summary.  MyChart is used to connect with patients for Virtual Visits (Telemedicine).  Patients are able to view lab/test results, encounter notes, upcoming appointments, etc.  Non-urgent messages can be sent to your provider as well.   To learn more about what you can do with MyChart, go to [email or web address].    Your next appointment:   1 year(s)  The format for your next appointment:   In Person  Provider:   Lewayne Bunting, MD{or one of the following Advanced Practice Providers on your designated Care Team:   Francis Dowse, [place] Casimiro Needle "Mardelle Matte" [place], [place] Earnest Rosier, NP  Remote monitoring is used to monitor your Pacemaker/ ICD from home. This monitoring reduces the number of office visits required  to check your device to one time per year. It allows [place] to keep an eye on the functioning of your device to ensure it is working properly.   Important Information About Sugar

## 2022-10-18 NOTE — Progress Notes (Signed)
HPI [name] [name] returns today for followup. She is a pleasant 72 yo woman with a h/o CHB, s/p PPM insertion. She has done well in the interim. No chest pain or sob and no syncope. No edema.  she remains busy caring for her great grandchildren. Allergies  Allergen Reactions   Guaifenesin & Derivatives Nausea And Vomiting    Tremors, anxiety   Penicillins     Pt states it does not work Did it involve swelling of the face/tongue/throat, SOB, or low BP? No Did it involve sudden or severe rash/hives, skin peeling, or any reaction on the inside of your mouth or nose? No Did you need to seek medical attention at a hospital or doctor's office? No When did it last happen?      5 years If all above answers are "NO", may proceed with cephalosporin use.  Pt states it does not work Pt states it does not work Pt states it does not work Did it involve swelling of the face/tongue/throat, SOB, or low BP? No Did it involve sudden or severe rash/hives, skin peeling, or any reaction on the inside of your mouth or nose? No Did you need to seek medical attention at a hospital or doctor's office? No When did it last happen?      5 years If all above answers are "NO", may proceed with cephalosporin use.   Sulfamethoxazole Other (See Comments)    Pt states it doesn't work and she doesn't want to take it      Current Outpatient Medications  Medication Sig Dispense Refill   Ascorbic Acid (VITAMIN C) 1000 MG tablet Take 1,000 mg by mouth 3 (three) times a week. (Gummies)     BIOTIN PO Patient take 2 gummies by mouth daily     cetirizine (ZYRTEC) 10 MG tablet Take 10 mg by mouth daily.     ELDERBERRY PO Patient take 2 gummies by mouth daily     ENTRESTO 24-26 MG TAKE 1 TABLET BY MOUTH TWICE A DAY 180 tablet 3   levothyroxine (SYNTHROID) 100 MCG tablet TAKE 1 TABLET BY MOUTH EVERY DAY 90 tablet 1   metoprolol succinate (TOPROL-XL) 25 MG 24 hr tablet TAKE 1 TABLET (25 MG TOTAL) BY MOUTH DAILY. 90  tablet 3   minocycline (MINOCIN,DYNACIN) 100 MG capsule Take 100 mg by mouth See admin instructions. Takes 100mg  daily for 7-10 days when she has "an outbreak".     naproxen sodium (ALEVE) 220 MG tablet Take 440 mg by mouth daily as needed (headache/pain).      omeprazole (PRILOSEC) 20 MG capsule Take 20 mg by mouth at bedtime.     spironolactone (ALDACTONE) 25 MG tablet TAKE 1/2 TABLET BY MOUTH EVERY DAY 45 tablet 1   Current Facility-Administered Medications  Medication Dose Route Frequency Provider Last Rate Last Admin   sodium chloride flush (NS) 0.9 % injection 3 mL  3 mL Intravenous Q12H Wendall Stade, MD         Past Medical History:  Diagnosis Date   Anxiety    because of parathyroid issues   Arthritis    hands   Asthma    x 2, triggered by allergies   Cancer ([facility])    melanoma removed from back, thyroid   Depression    bc of parathyroid problems   GERD (gastroesophageal reflux disease)    History of palpitations    none since 2016   Pre-diabetes    Seasonal allergies  Varicose vein     ROS:   All systems reviewed and negative except as noted in the HPI.   Past Surgical History:  Procedure Laterality Date   BIV PACEMAKER INSERTION CRT-P N/A 10/30/2018   Procedure: BIV PACEMAKER INSERTION CRT-P;  Surgeon: Marinus Maw, MD;  Location: [facility] INVASIVE CV LAB;  Service: Cardiovascular;  Laterality: N/A;   CARPAL TUNNEL RELEASE Right 02/05/2014   Procedure: RIGHT CARPAL TUNNEL RELEASE;  Surgeon: Kerrin Champagne, MD;  Location: [facility];  Service: Orthopedics;  Laterality: Right;   COLONOSCOPY     EYE SURGERY     Bilateral Lasik   LAPAROSCOPIC ASSISTED VAGINAL HYSTERECTOMY Bilateral 10/11/2013   Procedure: LAPAROSCOPIC ASSISTED VAGINAL HYSTERECTOMY BILATERAL SALPINGO OOPHORECTOMY;  Surgeon: Leslie Andrea, MD;  Location: [facility];  Service: Gynecology;  Laterality: Bilateral;   Malignant melanoma resected     lower back area   PARATHYROIDECTOMY Left  08/24/2016   Procedure: NECK EXPLORATION LEFT SUPERIOR PARATHYROIDECTOMY LEFT LOBE THYROIDECTOMY;  Surgeon: Darnell Level, MD;  Location: [facility] OR;  Service: General;  Laterality: Left;   RIGHT/LEFT HEART CATH AND CORONARY ANGIOGRAPHY N/A 01/15/2019   Procedure: RIGHT/LEFT HEART CATH AND CORONARY ANGIOGRAPHY;  Surgeon: Lyn Records, MD;  Location: [facility] INVASIVE CV LAB;  Service: Cardiovascular;  Laterality: N/A;   THYROIDECTOMY N/A 12/09/2016   Procedure: COMPLETION THYROIDECTOMY RIGHT LOBE;  Surgeon: Darnell Level, MD;  Location: [facility];  Service: General;  Laterality: N/A;   trigger thumb surgery     right   TUBAL LIGATION     VARICOSE VEIN SURGERY     leg     Family History  Problem Relation Age of Onset   Diabetes Mother 17       deceased   Heart failure Mother    CAD Mother    Heart attack Father 80       deceased   Heart attack Sister 15       deceased   Heart failure Brother 12       deceased     Social History   Socioeconomic History   Marital status: Married    Spouse name: Not on file   Number of children: 2   Years of education: Not on file   Highest education level: Not on file  Occupational History   Occupation: Retired  Tobacco Use   Smoking status: Former    Current packs/day: 0.00    Average packs/day: 1 pack/day for 10.0 years (10.0 ttl pk-yrs)    Types: Cigarettes    Start date: 01/04/1981    Quit date: 01/05/1991    Years since quitting: 31.8   Smokeless tobacco: Never  Vaping Use   Vaping status: Never Used  Substance and Sexual Activity   Alcohol use: No   Drug use: No   Sexual activity: Yes    Birth control/protection: Post-menopausal, Surgical  Other Topics Concern   Not on file  Social History Narrative   Not on file   Social Determinants of Health   Financial Resource Strain: Not on file  Food Insecurity: Not on file  Transportation Needs: Not on file  Physical Activity: Not on file  Stress: Not on file  Social Connections: Not on file   Intimate Partner Violence: Not on file     BP 110/60   Pulse 60   Ht 5' 4.5" (1.638 m)   Wt 171 lb 6.4 oz (77.7 kg)   SpO2 95%   BMI 28.97 kg/m  Physical Exam:  Well appearing NAD HEENT: Unremarkable Neck:  No JVD, no thyromegally Lymphatics:  No adenopathy Back:  No CVA tenderness Lungs:  Clear with no wheezes HEART:  Regular rate rhythm, no murmurs, no rubs, no clicks Abd:  soft, positive bowel sounds, no organomegally, no rebound, no guarding Ext:  2 plus pulses, no edema, no cyanosis, no clubbing Skin:  No rashes no nodules Neuro:  CN II through XII intact, motor grossly intact  EKG - nsr with biv pacing  DEVICE  Normal device function.  See PaceArt for details.   Assess/Plan:  CHB - she has no escape today. She is asymptomatic, s/p PPM insertion. 2. PPM - her Biotronik BIV PPM is working normally. We turned her LV output down a bit to maximize battery longevity. 3. HTN - her bp is well controlled. She will maintain a low sodium diet. 4. LV dysfunction - she is s/p biv PPM and her EF has near normalized. Continue current meds.   Sharlot Gowda Faylynn Stamos,MD

## 2022-10-29 DIAGNOSIS — Z23 Encounter for immunization: Secondary | ICD-10-CM | POA: Diagnosis not present

## 2022-11-02 ENCOUNTER — Ambulatory Visit (INDEPENDENT_AMBULATORY_CARE_PROVIDER_SITE_OTHER): Payer: Medicare HMO

## 2022-11-02 ENCOUNTER — Ambulatory Visit: Payer: Medicare HMO | Admitting: Internal Medicine

## 2022-11-02 ENCOUNTER — Encounter: Payer: Self-pay | Admitting: Internal Medicine

## 2022-11-02 VITALS — BP 118/68 | HR 69 | Ht 64.5 in | Wt 172.0 lb

## 2022-11-02 DIAGNOSIS — C73 Malignant neoplasm of thyroid gland: Secondary | ICD-10-CM | POA: Diagnosis not present

## 2022-11-02 DIAGNOSIS — E559 Vitamin D deficiency, unspecified: Secondary | ICD-10-CM | POA: Diagnosis not present

## 2022-11-02 DIAGNOSIS — E210 Primary hyperparathyroidism: Secondary | ICD-10-CM

## 2022-11-02 DIAGNOSIS — E890 Postprocedural hypothyroidism: Secondary | ICD-10-CM

## 2022-11-02 DIAGNOSIS — I442 Atrioventricular block, complete: Secondary | ICD-10-CM | POA: Diagnosis not present

## 2022-11-02 LAB — T4, FREE: Free T4: 0.87 ng/dL (ref 0.60–1.60)

## 2022-11-02 LAB — TSH: TSH: 0.91 u[IU]/mL (ref 0.35–5.50)

## 2022-11-02 NOTE — Progress Notes (Addendum)
Patient ID: [name] [name], female   DOB: May 21, 1950, 72 y.o.   MRN: [id number]   HPI  [name] [name] is a 72 y.o.-year-old pleasant female, initially referred by Dr. Gerrit Friends, presenting for follow-up for thyroid cancer, postsurgical hypothyroidism, history of primary hyperparathyroidism (s/p parathyroidectomy) and history of vitamin D deficiency.  Last visit 1 year ago.  Interim history: She did not ice problems swallowing, neck pressure, or any other neck discomfort. Also, no perioral numbness or muscle cramping.  Reviewed thyroid cancer history: Pt. has been dx with thyroid cancer in 08/2016, after having had neck exploration, left superior parathyroidectomy, and, as the mass was observed in the left thyroid lobe during surgery, she also had left lobectomy. 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 This was 2 cm, positive surgical margins.  Diagnosis 1. Parathyroid gland, Left Superior - ENLARGED PARATHYROID TISSUE, 0.445 GRAMS. 2. Thyroid, lobectomy, Left - PAPILLARY THYROID CARCINOMA, HURTHLE CELL VARIANT, 2.0 CM. - CARCINOMA IS PRESENT AT NON ORIENTED TISSUE EDGE(S). - SEE ONCOLOGY TABLE BELOW Microscopic Comment Specimen: Left thyroid Procedure (including lymph node sampling if applicable): Hemithyroidectomy Specimen Integrity (intact/fragmented): Focally disrupted Tumor focality: Unifocal Dominant tumor: Maximum tumor size (cm): 2.0 cm (gross measurement) Tumor laterality: Left thyroid Histologic type (including subtype and/or unique features as applicable): Papillary thyroid carcinoma, Hurthle cell variant Tumor capsule: Not identified Extrathyroidal extension: Not identified Capsular invasion with degree of invasion if present: N/A Margins: Present at non-oriented tissue edge(s). Lymphatic or vascular invasion: Not identified Lymph nodes: # examined 0; # positive; N/A Extracapsular extension (if applicable):  Not identified TNM code: pT1b, pNX Non-neoplastic thyroid: Hashimoto's thyroiditis  Completion thyroidectomy on 12/09/2016: Right thyroid lobe without malignancy.  RAI tx (02/09/2017): 70.8 mCi I131  Posttreatment whole body scan (02/21/2017): Negative for metastasis: 1. Expected activity in the thyroidectomy bed. 2. No evidence of metastatic disease.  Neck ultrasound (11/18/2017): As expected after thyroidectomy.  No recurrences.  Neck ultrasound (11/16/2018): No residual thyroid tissue or pathogenically enlarged lymph nodes  Latest thyroglobulin levels were undetectable: Lab Results  Component Value Date   THYROGLB <0.1 (L) 10/29/2021   THYROGLB <0.1 (L) 10/28/2020   THYROGLB <0.1 (L) 10/24/2019   THYROGLB <0.1 (L) 04/24/2019   THYROGLB <0.1 (L) 08/29/2018   THYROGLB <0.1 (L) 11/18/2017   THYROGLB <0.1 (L) 05/19/2017   THGAB <1.0 10/29/2021   THGAB <1.0 10/28/2020   THGAB <1 10/24/2019   THGAB <1.0 04/24/2019   THGAB 1 08/29/2018   THGAB <1.0 11/18/2017   THGAB 2 (H) 05/19/2017   Postsurgical hypothyroidism: -Uncontrolled   She could not tolerate Euthyrox (dizziness, headaches, heart racing, restlessness-retrospectively, these could have been caused by arrhythmia) >> changed back to levothyroxine.  Pt is on levothyroxine 88 mcg daily (decreased 10/2021), taken: - in am, around 6:30 AM - fasting - at least 30 min from b'fast - no Ca, Fe, MVI, + PPIs later in the day (at night) prn - + on Biotin - off for 2 weeks  Reviewed TFTs: Lab Results  Component Value Date   TSH 0.80 01/06/2022   TSH 0.25 (L) 10/29/2021   TSH 0.22 (L) 10/28/2020   TSH 0.81 10/24/2019   TSH 0.51 04/24/2019   TSH 4.09 10/27/2018   TSH 0.320 (L) 09/14/2018   TSH 0.24 (L) 08/29/2018   TSH 0.62 03/06/2018   TSH 0.20 (L) 01/11/2018   FREET4 1.18 01/06/2022   FREET4 1.16 10/29/2021   FREET4 1.01 10/28/2020   FREET4 0.94  10/24/2019   FREET4 1.14 04/24/2019   FREET4 0.90 10/27/2018   FREET4  1.23 08/29/2018   FREET4 0.91 03/06/2018   FREET4 1.05 01/11/2018   FREET4 1.19 11/18/2017  07/2021: by PCP: ? 01/01/2019:TSH 7.16 (0.45-4.5) 12/21/2016: TSH 15.3  She does have GERD and hoarseness.  She has + FH of thyroid disorders in: sister. No FH of thyroid cancer. No h/o radiation tx to head or neck other than RAI treatment. No herbal supplements. + Biotin use. No recent steroids use.   History of primary hyperparathyroidism: - s/p resection of a parathyroid adenoma weighing 445 mg in 08/2016  Reviewed pertinent labs: Lab Results  Component Value Date   PTH 21 01/19/2017   PTH Comment 01/19/2017   CALCIUM 8.5 (L) 11/19/2021   CALCIUM 9.2 08/25/2021   CALCIUM 9.2 10/28/2020   CALCIUM 8.7 (L) 01/11/2019   CALCIUM 9.0 10/28/2018   CALCIUM 8.2 (L) 09/14/2018   CALCIUM 8.9 11/18/2017   CALCIUM 9.1 01/19/2017   CALCIUM 8.5 (L) 12/10/2016   CALCIUM 9.0 12/06/2016  Reportedly normal calcium level 08/2020  Vitamin D deficiency:  He has a history of vitamin D deficiency: Lab Results  Component Value Date   VD25OH 35.13 01/06/2022   VD25OH 26.59 (L) 10/29/2021   VD25OH 30.09 10/28/2020   VD25OH 31.6 10/24/2019   VD25OH 44.74 04/24/2019   VD25OH 35.36 08/29/2018   VD25OH 36.69 01/11/2018   VD25OH 28.30 (L) 11/18/2017   VD25OH 32.06 05/19/2017   VD25OH 28.28 (L) 02/09/2017   Previously on vitamin D 5000 units once a week >> ran out in 2021 and did not restart.  She had a syncopal episode on 09/14/2018.  She had a pacemaker implanted.  She was found to have dilated cardiomyopathy.  Also, complete heart block.  She walks her dog daily >> 1 mi a day.  ROS: + see HPI  I reviewed pt's medications, allergies, PMH, social hx, family hx, and changes were documented in the history of present illness. Otherwise, unchanged from my initial visit note.  Past Medical History:  Diagnosis Date   Anxiety    because of parathyroid issues   Arthritis    hands   Asthma    x 2,  triggered by allergies   Cancer (HCC)    melanoma removed from back, thyroid   Depression    bc of parathyroid problems   GERD (gastroesophageal reflux disease)    History of palpitations    none since 2016   Pre-diabetes    Seasonal allergies    Varicose vein    Past Surgical History:  Procedure Laterality Date   BIV PACEMAKER INSERTION CRT-P N/A 10/30/2018   Procedure: BIV PACEMAKER INSERTION CRT-P;  Surgeon: Marinus Maw, MD;  Location: [facility];  Service: Cardiovascular;  Laterality: N/A;   CARPAL TUNNEL RELEASE Right 02/05/2014   Procedure: RIGHT CARPAL TUNNEL RELEASE;  Surgeon: Kerrin Champagne, MD;  Location: [facility];  Service: Orthopedics;  Laterality: Right;   COLONOSCOPY     EYE SURGERY     Bilateral Lasik   LAPAROSCOPIC ASSISTED VAGINAL HYSTERECTOMY Bilateral 10/11/2013   Procedure: LAPAROSCOPIC ASSISTED VAGINAL HYSTERECTOMY BILATERAL SALPINGO OOPHORECTOMY;  Surgeon: Leslie Andrea, MD;  Location: [facility];  Service: Gynecology;  Laterality: Bilateral;   Malignant melanoma resected     lower back area   PARATHYROIDECTOMY Left 08/24/2016   Procedure: NECK EXPLORATION LEFT SUPERIOR PARATHYROIDECTOMY LEFT LOBE THYROIDECTOMY;  Surgeon: Darnell Level, MD;  Location: [facility];  Service: General;  Laterality: Left;   RIGHT/LEFT HEART CATH AND CORONARY ANGIOGRAPHY N/A 01/15/2019   Procedure: RIGHT/LEFT HEART CATH AND CORONARY ANGIOGRAPHY;  Surgeon: Lyn Records, MD;  Location: [facility];  Service: Cardiovascular;  Laterality: N/A;   THYROIDECTOMY N/A 12/09/2016   Procedure: COMPLETION THYROIDECTOMY RIGHT LOBE;  Surgeon: Darnell Level, MD;  Location: [facility];  Service: General;  Laterality: N/A;   trigger thumb surgery     right   TUBAL LIGATION     VARICOSE VEIN SURGERY     leg   Social History   Socioeconomic History   Marital status: Married    Spouse name: Not on file   Number of children: 2   Years of education: Not on file   Highest  education level: Not on file  Occupational History   Occupation: Retired  Tobacco Use   Smoking status: Former    Current packs/day: 0.00    Average packs/day: 1 pack/day for 10.0 years (10.0 ttl pk-yrs)    Types: Cigarettes    Start date: 01/04/1981    Quit date: 01/05/1991    Years since quitting: 31.8   Smokeless tobacco: Never  Vaping Use   Vaping status: Never Used  Substance and Sexual Activity   Alcohol use: No   Drug use: No   Sexual activity: Yes    Birth control/protection: Post-menopausal, Surgical  Other Topics Concern   Not on file  Social History Narrative   Not on file   Social Determinants of Health   Financial Resource Strain: Not on file  Food Insecurity: Not on file  Transportation Needs: Not on file  Physical Activity: Not on file  Stress: Not on file  Social Connections: Not on file  Intimate Partner Violence: Not on file   Current Outpatient Medications on File Prior to Visit  Medication Sig Dispense Refill   Ascorbic Acid (VITAMIN C) 1000 MG tablet Take 1,000 mg by mouth 3 (three) times a week. (Gummies)     BIOTIN PO Patient take 2 gummies by mouth daily     cetirizine (ZYRTEC) 10 MG tablet Take 10 mg by mouth daily.     ELDERBERRY PO Patient take 2 gummies by mouth daily     ENTRESTO 24-26 MG TAKE 1 TABLET BY MOUTH TWICE A DAY 180 tablet 3   levothyroxine (SYNTHROID) 100 MCG tablet TAKE 1 TABLET BY MOUTH EVERY DAY 90 tablet 1   metoprolol succinate (TOPROL-XL) 25 MG 24 hr tablet TAKE 1 TABLET (25 MG TOTAL) BY MOUTH DAILY. 90 tablet 3   minocycline (MINOCIN,DYNACIN) 100 MG capsule Take 100 mg by mouth See admin instructions. Takes 100mg  daily for 7-10 days when she has "an outbreak".     naproxen sodium (ALEVE) 220 MG tablet Take 440 mg by mouth daily as needed (headache/pain).      omeprazole (PRILOSEC) 20 MG capsule Take 20 mg by mouth at bedtime.     spironolactone (ALDACTONE) 25 MG tablet TAKE 1/2 TABLET BY MOUTH EVERY DAY 45 tablet 1   Current  Facility-Administered Medications on File Prior to Visit  Medication Dose Route Frequency Provider Last Rate Last Admin   sodium chloride flush (NS) 0.9 % injection 3 mL  3 mL Intravenous Q12H Wendall Stade, MD       Allergies  Allergen Reactions   Guaifenesin & Derivatives Nausea And Vomiting    Tremors, anxiety   Penicillins     Pt states it does not work Did it involve  swelling of the face/tongue/throat, SOB, or low BP? No Did it involve sudden or severe rash/hives, skin peeling, or any reaction on the inside of your mouth or nose? No Did you need to seek medical attention at a hospital or doctor's office? No When did it last happen?      5 years If all above answers are "NO", may proceed with cephalosporin use.  Pt states it does not work Pt states it does not work Pt states it does not work Did it involve swelling of the face/tongue/throat, SOB, or low BP? No Did it involve sudden or severe rash/hives, skin peeling, or any reaction on the inside of your mouth or nose? No Did you need to seek medical attention at a hospital or doctor's office? No When did it last happen?      5 years If all above answers are "NO", may proceed with cephalosporin use.   Sulfamethoxazole Other (See Comments)    Pt states it doesn't work and she doesn't want to take it    Family History  Problem Relation Age of Onset   Diabetes Mother 47       deceased   Heart failure Mother    CAD Mother    Heart attack Father 22       deceased   Heart attack Sister 31       deceased   Heart failure Brother 47       deceased   PE: BP 118/68   Pulse 69   Ht 5' 4.5" (1.638 m)   Wt 172 lb (78 kg)   SpO2 98%   BMI 29.07 kg/m  Wt Readings from Last 3 Encounters:  11/02/22 172 lb (78 kg)  10/18/22 171 lb 6.4 oz (77.7 kg)  12/25/21 169 lb 8 oz (76.9 kg)   Constitutional: overweight, in NAD Eyes:  EOMI, no exophthalmos ENT: no neck masses, no cervical lymphadenopathy Cardiovascular: RRR, No  MRG Respiratory: CTA B Musculoskeletal: no deformities Skin:no rashes Neurological: no tremor with outstretched hands  ASSESSMENT: 1. Thyroid cancer - see HPI  2. Postsurgical hypothyroidism  3. H/o HPTH  4. Vitamin D def  PLAN:  1. Thyroid cancer -papillary, Hurthle cell variant -Patient has a history of incidental diagnosis of thyroid cancer after left lobectomy noticed during surgery for parathyroid adenoma.  At that time, the left lobe appeared abnormal and was resected.  She had a 2 cm PTC focus which appears to be a Hurthle cell variant.  She is stage I TNM. However, due to the fact that her the cell variant of PTC is more aggressive, I recommended completion thyroidectomy and RAI treatment afterwards.  She had completion thyroidectomy by Dr. Gerrit Friends on 12/09/2017 and then RAI treatment with 70.8 mCi I-131 on 02/09/2017. -We are following her with the [facility] thyroglobulin assay as she has a history of positive ATA antibodies.  Both thyroglobulin and ATA antibodies are undetectable at last visit -Reviewed the latest neck ultrasound report from 11/2018: No residual thyroid cancer or metastasis in the neck -Will check a neck ultrasound now -I will have the patient return in 1 year  2. Postsurgical hypothyroidism - latest thyroid labs reviewed with pt. >> normal after reducing the dose at last visit: Lab Results  Component Value Date   TSH 0.80 01/06/2022  - she continues on LT4 100 mcg 6/7 days - equivalent of 86 mcg daily - pt feels good on this dose. - we discussed about taking the thyroid hormone every day,  with water, >30 minutes before breakfast, separated by >4 hours from acid reflux medications, calcium, iron, multivitamins. Pt. is taking it correctly. - will check thyroid tests today: TSH and fT4 - If labs are abnormal, she will need to return for repeat TFTs in 1.5 months  3. H/o Primary HPTH -She is s/p resection of a parathyroid adenoma weighing 4.45 g in  08/2016 -Calcium normalized initially but she has intermittently low levels, possibly due to vitamin D deficiency: Lab Results  Component Value Date   CALCIUM 8.5 (L) 11/19/2021  -We will check ionized calcium at next blood draw  4. Vit D def -Previously on 5000 units vitamin D once a week but she ran out  -At last visit, vitamin D level was slightly low, at 26.6 and I advised her to start vitamin D 1000 units daily -Subsequent vitamin D level was normal in 01/2022: Lab Results  Component Value Date   VD25OH 35.13 01/06/2022  -as of now, she is off the supplement again.  We discussed about the connection between vitamin D and calcium levels and I strongly advised her to stay on the vitamin D supplement from now on -restart the 1000 units supplement daily -We will repeat her vitamin D level at next blood draw  Needs refills - 88 mcg LT4.  Component     Latest Ref Rng 11/02/2022  TSH     0.35 - 5.50 uIU/mL 0.91   T4,Free(Direct)     0.60 - 1.60 ng/dL 5.28   Thyroglobulin Antibody     0.0 - 0.9 IU/mL <1.0   Thyroglobulin by IMA     1.5 - 38.5 ng/mL <0.1 (L)   Testa are all at goal.  Will refill her levothyroxine at 88 mcg daily.  Neck U/S (11/05/2022): The thyroid gland is surgically absent. Evaluation of the right and left resection bed demonstrate no evidence of thyroid tissue, nodularity or lymphadenopathy. No abnormalities are identified.   IMPRESSION: Surgical changes of total thyroidectomy without evidence of residual or recurrent disease.  Carlus Pavlov, MD PhD [facility]

## 2022-11-02 NOTE — Patient Instructions (Addendum)
Please stop at the lab.  Please continue 88 mcg daily of Levothyroxine.  Take the thyroid hormone every day, with water, at least 30 minutes before breakfast, separated by at least 4 hours from: - acid reflux medications - calcium - iron - multivitamins  Please restart 1000 unts vitamin D3 daily. You need to take this daily.  Please come back for a follow-up appointment in 1 year.

## 2022-11-03 ENCOUNTER — Encounter: Payer: Self-pay | Admitting: Internal Medicine

## 2022-11-03 LAB — THYROGLOBULIN BY IMA: Thyroglobulin by IMA: <0.1 ng/mL — ABNORMAL LOW (ref 1.5–38.5)

## 2022-11-03 LAB — CUP PACEART REMOTE DEVICE CHECK
Battery Remaining Percentage: 55 %
Brady Statistic RA Percent Paced: 74 %
Brady Statistic RV Percent Paced: 100 %
Date Time Interrogation Session: 20241029072630
Implantable Lead Connection Status: 753985
Implantable Lead Connection Status: 753985
Implantable Lead Connection Status: 753985
Implantable Lead Implant Date: 20201026
Implantable Lead Implant Date: 20201026
Implantable Lead Implant Date: 20201026
Implantable Lead Location: 753858
Implantable Lead Location: 753859
Implantable Lead Location: 753860
Implantable Lead Model: 377
Implantable Lead Model: 377
Implantable Lead Model: 401183
Implantable Lead Serial Number: 81173669
Implantable Lead Serial Number: 81216766
Implantable Lead Serial Number: 81267228
Implantable Pulse Generator Implant Date: 20201026
Lead Channel Impedance Value: 527 Ohm
Lead Channel Impedance Value: 546 Ohm
Lead Channel Impedance Value: 624 Ohm
Lead Channel Pacing Threshold Amplitude: 0.9 V
Lead Channel Pacing Threshold Amplitude: 0.9 V
Lead Channel Pacing Threshold Pulse Width: 0.4 ms
Lead Channel Pacing Threshold Pulse Width: 0.4 ms
Lead Channel Sensing Intrinsic Amplitude: 0.9 mV
Lead Channel Sensing Intrinsic Amplitude: 10.7 mV
Lead Channel Sensing Intrinsic Amplitude: 6.3 mV
Lead Channel Setting Pacing Amplitude: 1.6 V
Lead Channel Setting Pacing Amplitude: 1.8 V
Lead Channel Setting Pacing Amplitude: 2.4 V
Lead Channel Setting Pacing Pulse Width: 0.4 ms
Lead Channel Setting Pacing Pulse Width: 0.75 ms
Pulse Gen Model: 407137
Pulse Gen Serial Number: 69580491

## 2022-11-03 LAB — TGAB+THYROGLOBULIN IMA OR LCMS: Thyroglobulin Antibody: <1 [IU]/mL (ref 0.0–0.9)

## 2022-11-03 MED ORDER — LEVOTHYROXINE SODIUM 88 MCG PO TABS: 88.0000 ug | ORAL_TABLET | Freq: Every day | ORAL | 3 refills | Status: DC

## 2022-11-05 ENCOUNTER — Ambulatory Visit
Admission: RE | Admit: 2022-11-05 | Discharge: 2022-11-05 | Disposition: A | Discharge status: Home / Self Care | Payer: Medicare HMO | Source: Ambulatory Visit | Attending: Internal Medicine | Admitting: Internal Medicine

## 2022-11-05 DIAGNOSIS — Z85850 Personal history of malignant neoplasm of thyroid: Secondary | ICD-10-CM | POA: Diagnosis not present

## 2022-11-05 DIAGNOSIS — Z9089 Acquired absence of other organs: Secondary | ICD-10-CM | POA: Diagnosis not present

## 2022-11-05 DIAGNOSIS — C73 Malignant neoplasm of thyroid gland: Secondary | ICD-10-CM

## 2022-11-05 DIAGNOSIS — Z08 Encounter for follow-up examination after completed treatment for malignant neoplasm: Secondary | ICD-10-CM | POA: Diagnosis not present

## 2022-11-17 NOTE — Progress Notes (Addendum)
Cardiology Office Note    Patient Name: [name] [name] Date of Encounter: 11/17/2022  Primary Care Provider:  Richardean Chimera, MD Primary Cardiologist:  None Primary Electrophysiologist: Lewayne Bunting, MD   Past Medical History    Past Medical History:  Diagnosis Date   Anxiety    because of parathyroid issues   Arthritis    hands   Asthma    x 2, triggered by allergies   Cancer (HCC)    melanoma removed from back, thyroid   Depression    bc of parathyroid problems   GERD (gastroesophageal reflux disease)    History of palpitations    none since 2016   Pre-diabetes    Seasonal allergies    Varicose vein     History of Present Illness  [name] [name] is a 73 y.o. female with PMH of HTN, HFpEF, NICM, LBBB, asthma thyroid cancer s/p thyroidectomy 2018, CHB s/p Biotronik PPM 10/2018 who presents today for follow-up of congestive heart failure.   [name] [name] was last seen on 11/16/2021 for follow-up.  During her previous visit she was doing well and tolerating current GDMT without any side effects.  She was euvolemic on examination and blood pressure was well-controlled.  Patient's PPM was operating in normal function and currently followed by Dr. Ladona Ridgel.  She was seen in [place] on 12/25/2021 for CHF medication titration and was advised to continue Entresto 24/26 mg with no further titration due to soft blood pressure.  She was seen by Dr. Ladona Ridgel on 10/18/2022 and was doing well with no new complaints.   [name] [name] presents for a routine follow-up.  She report generally feeling well and maintaining an active lifestyle, including walking the dog and taking care of grandchildren. However, she has been experiencing a persistent dry cough, which they believe started after initiating Entresto. The cough is described as dry and hacking, occurring consistently and becoming more pronounced with deep breathing or exertion. The patient also reports occasional leg  cramps, particularly at night. They have been managing these symptoms without any specific interventions. The patient's blood pressure was noted to be a bit low during the visit at 82/52 and was 94/62 on recheck.  She denies any symptoms of dizziness or lightheadedness.patient denies chest pain, palpitations, dyspnea, PND, orthopnea, nausea, vomiting, dizziness, syncope, edema, weight gain, or early satiety.   Review of Systems  Please see the history of present illness.    All other systems reviewed and are otherwise negative except as noted above.  Physical Exam    Wt Readings from Last 3 Encounters:  11/02/22 172 lb (78 kg)  10/18/22 171 lb 6.4 oz (77.7 kg)  12/25/21 169 lb 8 oz (76.9 kg)   VV:OHYWV were no vitals filed for this visit.,There is no height or weight on file to calculate BMI. GEN: Well nourished, well developed in no acute distress Neck: No JVD; No carotid bruits Pulmonary: Clear to auscultation without rales, wheezing or rhonchi  Cardiovascular: Normal rate. Regular rhythm. Normal S1. Normal S2.   Murmurs: There is no murmur.  ABDOMEN: Soft, non-tender, non-distended EXTREMITIES:  No edema; No deformity   EKG/LABS/ Recent Cardiac Studies   ECG personally reviewed by me today -none completed today  Risk Assessment/Calculations:          Lab Results  Component Value Date   WBC 7.4 01/11/2019   HGB 11.9 (L) 01/15/2019   HCT 35.0 (L) 01/15/2019   MCV 89.4 01/11/2019   PLT 254 01/11/2019  Lab Results  Component Value Date   CREATININE 0.78 11/19/2021   BUN 10 11/19/2021   NA 141 11/19/2021   K 4.0 11/19/2021   CL 102 11/19/2021   CO2 25 11/19/2021   No results found for: "CHOL", "HDL", "LDLCALC", "LDLDIRECT", "TRIG", "CHOLHDL"  Lab Results  Component Value Date   HGBA1C 6.1 (H) 12/06/2016   Assessment & Plan    1.  HFrEF/NICM: -Patient's previous 2D echo showed reduced function of 35 as 40% -We will recheck 2D echo to evaluate LV function She has  been compliant with her current medications but reports a dry hacking cough. -We will have her hold spironolactone 12.5 mg for 2 weeks to see if cough resolves. -If cough does not resolve we will have her resume spironolactone and hold Entresto. -Low sodium diet, fluid restriction <2L, and daily weights encouraged. Educated to contact our office for weight gain of 2 lbs overnight or 5 lbs in one week.    2.  History of CHB: -s/p Biotronik PPM placed 10/2018 -Currently followed by Dr. Ladona Ridgel with normal device function   3.  Essential hypertension: -Patient's blood pressure today was well controlled at 94/62 -Continue Toprol and Entresto 24/26 mg twice daily   4.  History of thyroid CA: -s/p thyroidectomy 2018 currently in remission  5.  Muscle cramps: -Patient reports cramping at the end of the day -We will check magnesium and BMET today to determine if electrolytes are abnormal  Disposition: Follow-up with None or APP in 12 months    Signed, Napoleon Form, Leodis Rains, NP 11/17/2022, 7:44 PM [facility]

## 2022-11-18 ENCOUNTER — Ambulatory Visit: Payer: Medicare HMO | Attending: Nurse Practitioner | Admitting: Nurse Practitioner

## 2022-11-18 ENCOUNTER — Encounter: Payer: Self-pay | Admitting: Nurse Practitioner

## 2022-11-18 VITALS — BP 94/62 | HR 59 | Ht 64.5 in | Wt 170.6 lb

## 2022-11-18 DIAGNOSIS — I442 Atrioventricular block, complete: Secondary | ICD-10-CM | POA: Diagnosis not present

## 2022-11-18 DIAGNOSIS — I5022 Chronic systolic (congestive) heart failure: Secondary | ICD-10-CM

## 2022-11-18 DIAGNOSIS — C73 Malignant neoplasm of thyroid gland: Secondary | ICD-10-CM | POA: Diagnosis not present

## 2022-11-18 DIAGNOSIS — I420 Dilated cardiomyopathy: Secondary | ICD-10-CM | POA: Diagnosis not present

## 2022-11-18 DIAGNOSIS — I10 Essential (primary) hypertension: Secondary | ICD-10-CM

## 2022-11-18 DIAGNOSIS — R252 Cramp and spasm: Secondary | ICD-10-CM | POA: Diagnosis not present

## 2022-11-18 DIAGNOSIS — I428 Other cardiomyopathies: Secondary | ICD-10-CM | POA: Diagnosis not present

## 2022-11-18 NOTE — Patient Instructions (Signed)
Medication Instructions:  HOLD Spironolactone-to see if the cough gets better. CONTACT THE OFFICE IN 2 WEEKS WITH AN UPDATE OF YOUR COUGH *If you need a refill on your cardiac medications before your next appointment, please call your pharmacy*   Lab Work: TODAY-BMET & MAG If you have labs (blood work) drawn today and your tests are completely normal, you will receive your results only by: MyChart Message (if you have MyChart) OR A paper copy in the mail If you have any lab test that is abnormal or we need to change your treatment, we will call you to review the results.   Testing/Procedures: NONE ORDERED   Follow-Up: At [facility], you and your health needs are our priority.  As part of our continuing mission to provide you with exceptional heart care, we have created designated Provider Care Teams.  These Care Teams include your primary Cardiologist (physician) and Advanced Practice Providers (APPs -  Physician Assistants and Nurse Practitioners) who all work together to provide you with the care you need, when you need it.  We recommend signing up for the patient portal called "MyChart".  Sign up information is provided on this After Visit Summary.  MyChart is used to connect with patients for Virtual Visits (Telemedicine).  Patients are able to view lab/test results, encounter notes, upcoming appointments, etc.  Non-urgent messages can be sent to your provider as well.   To learn more about what you can do with MyChart, go to [email or web address].    Your next appointment:   12 month(s)  Provider:    Other Instructions

## 2022-11-19 ENCOUNTER — Encounter: Payer: Self-pay | Admitting: Cardiovascular Disease

## 2022-11-19 ENCOUNTER — Telehealth: Payer: Self-pay | Admitting: Cardiovascular Disease

## 2022-11-19 LAB — BASIC METABOLIC PANEL
BUN/Creatinine Ratio: 11 — ABNORMAL LOW (ref 12–28)
BUN: 10 mg/dL (ref 8–27)
CO2: 24 mmol/L (ref 20–29)
Calcium: 9.1 mg/dL (ref 8.7–10.3)
Chloride: 101 mmol/L (ref 96–106)
Creatinine, Ser: 0.9 mg/dL (ref 0.57–1.00)
Glucose: 109 mg/dL — ABNORMAL HIGH (ref 70–99)
Potassium: 4.3 mmol/L (ref 3.5–5.2)
Sodium: 141 mmol/L (ref 134–144)
eGFR: 68 mL/min/{1.73_m2} (ref >59)

## 2022-11-19 LAB — MAGNESIUM: Magnesium: 2.2 mg/dL (ref 1.6–2.3)

## 2022-11-19 NOTE — Telephone Encounter (Signed)
Patient returned staff call. 

## 2022-11-19 NOTE — Telephone Encounter (Signed)
Error

## 2022-11-19 NOTE — Telephone Encounter (Signed)
Please let patient know that her electrolytes and magnesium are both normal.  Please make sure to stay hydrated and try sugar-free energy drinks such as Gatorade and Powerade.   Robin Searing, NP    Patient has been notified directly; all questions, if any, were answered. Patient voiced understanding.

## 2022-11-22 NOTE — Progress Notes (Signed)
Remote pacemaker transmission.   

## 2022-11-23 DIAGNOSIS — H04123 Dry eye syndrome of bilateral lacrimal glands: Secondary | ICD-10-CM | POA: Diagnosis not present

## 2022-11-25 ENCOUNTER — Ambulatory Visit (HOSPITAL_COMMUNITY)
Admission: RE | Admit: 2022-11-25 | Discharge: 2022-11-25 | Disposition: A | Discharge status: Home / Self Care | Payer: Medicare HMO | Source: Ambulatory Visit | Attending: Nurse Practitioner | Admitting: Nurse Practitioner

## 2022-11-25 DIAGNOSIS — I110 Hypertensive heart disease with heart failure: Secondary | ICD-10-CM | POA: Diagnosis not present

## 2022-11-25 DIAGNOSIS — I428 Other cardiomyopathies: Secondary | ICD-10-CM | POA: Diagnosis not present

## 2022-11-25 DIAGNOSIS — C73 Malignant neoplasm of thyroid gland: Secondary | ICD-10-CM | POA: Insufficient documentation

## 2022-11-25 DIAGNOSIS — I5022 Chronic systolic (congestive) heart failure: Secondary | ICD-10-CM | POA: Diagnosis not present

## 2022-11-25 DIAGNOSIS — I10 Essential (primary) hypertension: Secondary | ICD-10-CM

## 2022-11-25 DIAGNOSIS — I442 Atrioventricular block, complete: Secondary | ICD-10-CM | POA: Diagnosis not present

## 2022-11-25 DIAGNOSIS — I420 Dilated cardiomyopathy: Secondary | ICD-10-CM | POA: Diagnosis not present

## 2022-11-25 LAB — ECHOCARDIOGRAM COMPLETE
AR max vel: 2.56 cm2
AV Area VTI: 2.38 cm2
AV Area mean vel: 2.37 cm2
AV Mean grad: 2 mm[Hg]
AV Peak grad: 3.5 mm[Hg]
Ao pk vel: 0.94 m/s
Area-P 1/2: 3.12 cm2
P 1/2 time: 561 ms
S' Lateral: 3.3 cm
Single Plane A4C EF: 44.4 %

## 2022-11-25 NOTE — Progress Notes (Signed)
  Echocardiogram 2D Echocardiogram has been performed.  [name] [name] 11/25/2022, 11:55 AM

## 2023-02-01 ENCOUNTER — Ambulatory Visit (INDEPENDENT_AMBULATORY_CARE_PROVIDER_SITE_OTHER): Payer: Medicare HMO

## 2023-02-01 DIAGNOSIS — I420 Dilated cardiomyopathy: Secondary | ICD-10-CM | POA: Diagnosis not present

## 2023-02-01 LAB — CUP PACEART REMOTE DEVICE CHECK
Battery Voltage: 55
Date Time Interrogation Session: 20250128082422
Implantable Lead Connection Status: 753985
Implantable Lead Connection Status: 753985
Implantable Lead Connection Status: 753985
Implantable Lead Implant Date: 20201026
Implantable Lead Implant Date: 20201026
Implantable Lead Implant Date: 20201026
Implantable Lead Location: 753858
Implantable Lead Location: 753859
Implantable Lead Location: 753860
Implantable Lead Model: 377
Implantable Lead Model: 377
Implantable Lead Model: 401183
Implantable Lead Serial Number: 81173669
Implantable Lead Serial Number: 81216766
Implantable Lead Serial Number: 81267228
Implantable Pulse Generator Implant Date: 20201026
Pulse Gen Model: 407137
Pulse Gen Serial Number: 69580491

## 2023-03-03 ENCOUNTER — Other Ambulatory Visit: Payer: Self-pay | Admitting: Cardiovascular Disease

## 2023-03-13 NOTE — Progress Notes (Signed)
 Remote pacemaker transmission.

## 2023-05-03 ENCOUNTER — Ambulatory Visit (INDEPENDENT_AMBULATORY_CARE_PROVIDER_SITE_OTHER): Payer: Medicare HMO

## 2023-05-03 DIAGNOSIS — I420 Dilated cardiomyopathy: Secondary | ICD-10-CM

## 2023-05-03 LAB — CUP PACEART REMOTE DEVICE CHECK
Battery Voltage: 50
Date Time Interrogation Session: 20250429083518
Implantable Lead Connection Status: 753985
Implantable Lead Connection Status: 753985
Implantable Lead Connection Status: 753985
Implantable Lead Implant Date: 20201026
Implantable Lead Implant Date: 20201026
Implantable Lead Implant Date: 20201026
Implantable Lead Location: 753858
Implantable Lead Location: 753859
Implantable Lead Location: 753860
Implantable Lead Model: 377
Implantable Lead Model: 377
Implantable Lead Model: 401183
Implantable Lead Serial Number: 81173669
Implantable Lead Serial Number: 81216766
Implantable Lead Serial Number: 81267228
Implantable Pulse Generator Implant Date: 20201026
Pulse Gen Model: 407137
Pulse Gen Serial Number: 69580491

## 2023-05-31 ENCOUNTER — Other Ambulatory Visit: Payer: Self-pay | Admitting: Cardiovascular Disease

## 2023-06-17 NOTE — Progress Notes (Signed)
 Remote pacemaker transmission.

## 2023-07-08 ENCOUNTER — Other Ambulatory Visit: Payer: Self-pay | Admitting: Internal Medicine

## 2023-07-08 DIAGNOSIS — E890 Postprocedural hypothyroidism: Secondary | ICD-10-CM

## 2023-07-11 NOTE — Telephone Encounter (Signed)
 Refill request complete

## 2023-08-02 ENCOUNTER — Ambulatory Visit: Payer: Medicare HMO

## 2023-08-02 DIAGNOSIS — I420 Dilated cardiomyopathy: Secondary | ICD-10-CM | POA: Diagnosis not present

## 2023-08-02 LAB — CUP PACEART REMOTE DEVICE CHECK
Date Time Interrogation Session: 20250729094043
Implantable Lead Connection Status: 753985
Implantable Lead Connection Status: 753985
Implantable Lead Connection Status: 753985
Implantable Lead Implant Date: 20201026
Implantable Lead Implant Date: 20201026
Implantable Lead Implant Date: 20201026
Implantable Lead Location: 753858
Implantable Lead Location: 753859
Implantable Lead Location: 753860
Implantable Lead Model: 377
Implantable Lead Model: 377
Implantable Lead Model: 401183
Implantable Lead Serial Number: 81173669
Implantable Lead Serial Number: 81216766
Implantable Lead Serial Number: 81267228
Implantable Pulse Generator Implant Date: 20201026
Pulse Gen Model: 407137
Pulse Gen Serial Number: 69580491

## 2023-08-05 ENCOUNTER — Ambulatory Visit: Payer: Self-pay | Admitting: Internal Medicine

## 2023-08-23 DIAGNOSIS — I5020 Unspecified systolic (congestive) heart failure: Secondary | ICD-10-CM | POA: Diagnosis not present

## 2023-08-23 DIAGNOSIS — Z6827 Body mass index (BMI) 27.0-27.9, adult: Secondary | ICD-10-CM | POA: Diagnosis not present

## 2023-08-23 DIAGNOSIS — K2100 Gastro-esophageal reflux disease with esophagitis, without bleeding: Secondary | ICD-10-CM | POA: Diagnosis not present

## 2023-08-23 DIAGNOSIS — I442 Atrioventricular block, complete: Secondary | ICD-10-CM | POA: Diagnosis not present

## 2023-08-23 DIAGNOSIS — E039 Hypothyroidism, unspecified: Secondary | ICD-10-CM | POA: Diagnosis not present

## 2023-10-05 NOTE — Progress Notes (Signed)
 Remote PPM Transmission

## 2023-10-28 ENCOUNTER — Encounter: Payer: Self-pay | Admitting: Internal Medicine

## 2023-10-28 ENCOUNTER — Ambulatory Visit: Attending: Internal Medicine | Admitting: Internal Medicine

## 2023-10-28 VITALS — BP 100/54 | HR 96 | Ht 64.5 in | Wt 170.0 lb

## 2023-10-28 DIAGNOSIS — I5022 Chronic systolic (congestive) heart failure: Secondary | ICD-10-CM | POA: Diagnosis not present

## 2023-10-28 DIAGNOSIS — I10 Essential (primary) hypertension: Secondary | ICD-10-CM

## 2023-10-28 LAB — CUP PACEART INCLINIC DEVICE CHECK
Date Time Interrogation Session: 20251024164149
Implantable Lead Connection Status: 753985
Implantable Lead Connection Status: 753985
Implantable Lead Connection Status: 753985
Implantable Lead Implant Date: 20201026
Implantable Lead Implant Date: 20201026
Implantable Lead Implant Date: 20201026
Implantable Lead Location: 753858
Implantable Lead Location: 753859
Implantable Lead Location: 753860
Implantable Lead Model: 377
Implantable Lead Model: 377
Implantable Lead Model: 401183
Implantable Lead Serial Number: 81173669
Implantable Lead Serial Number: 81216766
Implantable Lead Serial Number: 81267228
Implantable Pulse Generator Implant Date: 20201026
Pulse Gen Model: 407137
Pulse Gen Serial Number: 69580491

## 2023-10-28 NOTE — Patient Instructions (Signed)

## 2023-10-28 NOTE — Progress Notes (Signed)
 HPI Mrs. Lenker returns today for followup. She is a pleasant 73 yo woman with a h/o CHB, s/p PPM insertion. She has done well in the interim. No chest pain or sob and no syncope. No edema.   Allergies  Allergen Reactions   Guaifenesin & Derivatives Nausea And Vomiting    Tremors, anxiety   Penicillins     Pt states it does not work Did it involve swelling of the face/tongue/throat, SOB, or low BP? No Did it involve sudden or severe rash/hives, skin peeling, or any reaction on the inside of your mouth or nose? No Did you need to seek medical attention at a hospital or doctor's office? No When did it last happen?      5 years If all above answers are "NO", may proceed with cephalosporin use.  Pt states it does not work Pt states it does not work Pt states it does not work Did it involve swelling of the face/tongue/throat, SOB, or low BP? No Did it involve sudden or severe rash/hives, skin peeling, or any reaction on the inside of your mouth or nose? No Did you need to seek medical attention at a hospital or doctor's office? No When did it last happen?      5 years If all above answers are "NO", may proceed with cephalosporin use.   Sulfamethoxazole Other (See Comments)    Pt states it doesn't work and she doesn't want to take it      Current Outpatient Medications  Medication Sig Dispense Refill   BIOTIN PO Patient take 2 gummies by mouth daily     ENTRESTO  24-26 MG TAKE 1 TABLET BY MOUTH TWICE A DAY 180 tablet 3   levothyroxine  (SYNTHROID ) 88 MCG tablet TAKE 1 TABLET BY MOUTH EVERY DAY 90 tablet 3   metoprolol  succinate (TOPROL -XL) 25 MG 24 hr tablet TAKE 1 TABLET (25 MG TOTAL) BY MOUTH DAILY. 90 tablet 1   minocycline  (MINOCIN ,DYNACIN ) 100 MG capsule Take 100 mg by mouth See admin instructions. Takes 100mg  daily for 7-10 days when she has an outbreak. (Patient taking differently: Take 100 mg by mouth See admin instructions. Takes 100mg  daily for 7-10 days when she  has an outbreak. As needed)     naproxen  sodium (ALEVE ) 220 MG tablet Take 440 mg by mouth daily as needed (headache/pain).      omeprazole (PRILOSEC) 20 MG capsule Take 20 mg by mouth at bedtime.     spironolactone  (ALDACTONE ) 25 MG tablet TAKE 1/2 TABLET BY MOUTH EVERY DAY 45 tablet 2   Ascorbic Acid (VITAMIN C) 1000 MG tablet Take 1,000 mg by mouth 3 (three) times a week. (Gummies) (Patient not taking: Reported on 10/28/2023)     cetirizine (ZYRTEC) 10 MG tablet Take 10 mg by mouth daily. (Patient not taking: Reported on 10/28/2023)     ELDERBERRY PO Patient take 2 gummies by mouth daily (Patient not taking: Reported on 10/28/2023)     Current Facility-Administered Medications  Medication Dose Route Frequency Provider Last Rate Last Admin   sodium chloride  flush (NS) 0.9 % injection 3 mL  3 mL Intravenous Q12H Delford Maude BROCKS, MD         Past Medical History:  Diagnosis Date   Anxiety    because of parathyroid  issues   Arthritis    hands   Asthma    x 2, triggered by allergies   Cancer (HCC)    melanoma removed from back, thyroid   Depression    bc of parathyroid  problems   GERD (gastroesophageal reflux disease)    History of palpitations    none since 2016   Pre-diabetes    Seasonal allergies    Varicose vein     ROS:   All systems reviewed and negative except as noted in the HPI.   Past Surgical History:  Procedure Laterality Date   BIV PACEMAKER INSERTION CRT-P N/A 10/30/2018   Procedure: BIV PACEMAKER INSERTION CRT-P;  Surgeon: Waddell Danelle ORN, MD;  Location: [facility];  Service: Cardiovascular;  Laterality: N/A;   CARPAL TUNNEL RELEASE Right 02/05/2014   Procedure: RIGHT CARPAL TUNNEL RELEASE;  Surgeon: Lynwood FORBES Better, MD;  Location: [facility];  Service: Orthopedics;  Laterality: Right;   COLONOSCOPY     EYE SURGERY     Bilateral Lasik   LAPAROSCOPIC ASSISTED VAGINAL HYSTERECTOMY Bilateral 10/11/2013   Procedure: LAPAROSCOPIC ASSISTED  VAGINAL HYSTERECTOMY BILATERAL SALPINGO OOPHORECTOMY;  Surgeon: Lynwood FORBES Curlene PONCE, MD;  Location: [facility];  Service: Gynecology;  Laterality: Bilateral;   Malignant melanoma resected     lower back area   PARATHYROIDECTOMY Left 08/24/2016   Procedure: NECK EXPLORATION LEFT SUPERIOR PARATHYROIDECTOMY LEFT LOBE THYROIDECTOMY;  Surgeon: Eletha Boas, MD;  Location: [facility];  Service: General;  Laterality: Left;   RIGHT/LEFT HEART CATH AND CORONARY ANGIOGRAPHY N/A 01/15/2019   Procedure: RIGHT/LEFT HEART CATH AND CORONARY ANGIOGRAPHY;  Surgeon: Claudene Victory ORN, MD;  Location: [facility];  Service: Cardiovascular;  Laterality: N/A;   THYROIDECTOMY N/A 12/09/2016   Procedure: COMPLETION THYROIDECTOMY RIGHT LOBE;  Surgeon: Eletha Boas, MD;  Location: [facility];  Service: General;  Laterality: N/A;   trigger thumb surgery     right   TUBAL LIGATION     VARICOSE VEIN SURGERY     leg     Family History  Problem Relation Age of Onset   Diabetes Mother 52       deceased   Heart failure Mother    CAD Mother    Heart attack Father 88       deceased   Heart attack Sister 34       deceased   Heart failure Brother 5       deceased     Social History   Socioeconomic History   Marital status: Married    Spouse name: Not on file   Number of children: 2   Years of education: Not on file   Highest education level: Not on file  Occupational History   Occupation: Retired  Tobacco Use   Smoking status: Former    Current packs/day: 0.00    Average packs/day: 1 pack/day for 10.0 years (10.0 ttl pk-yrs)    Types: Cigarettes    Start date: 01/04/1981    Quit date: 01/05/1991    Years since quitting: 32.8   Smokeless tobacco: Never  Vaping Use   Vaping status: Never Used  Substance and Sexual Activity   Alcohol use: No   Drug use: No   Sexual activity: Yes    Birth control/protection: Post-menopausal, Surgical  Other Topics Concern   Not on file  Social History Narrative   Not on file    Social Drivers of Health   Financial Resource Strain: Not on file  Food Insecurity: Not on file  Transportation Needs: Not on file  Physical Activity: Not on file  Stress: Not on file  Social Connections: Not on file  Intimate Partner Violence: Not  on file     BP (!) 100/54   Pulse 96   Ht 5' 4.5 (1.638 m)   Wt 170 lb (77.1 kg)   SpO2 96%   BMI 28.73 kg/m   Physical Exam:  Well appearing NAD HEENT: Unremarkable Neck:  No JVD, no thyromegally Lymphatics:  No adenopathy Back:  No CVA tenderness Lungs:  Clear with no wheezes HEART:  Regular rate rhythm, no murmurs, no rubs, no clicks Abd:  soft, positive bowel sounds, no organomegally, no rebound, no guarding Ext:  2 plus pulses, no edema, no cyanosis, no clubbing Skin:  No rashes no nodules Neuro:  CN II through XII intact, motor grossly intact  EKG - nsr with biv pacing  DEVICE  Normal device function.  See PaceArt for details.   Assess/Plan:  CHB - she has no escape today. She is asymptomatic, s/p PPM insertion. 2. PPM - her Biotronik BIV PPM is working normally. Her thresholds are good.  3. HTN - her bp is well controlled. She will maintain a low sodium diet. 4. LV dysfunction - she is s/p biv PPM and her EF has near normalized. Continue current meds.   Danelle Davione Lenker,MD

## 2023-10-31 ENCOUNTER — Encounter: Payer: Self-pay | Admitting: Internal Medicine

## 2023-10-31 ENCOUNTER — Other Ambulatory Visit

## 2023-10-31 ENCOUNTER — Ambulatory Visit: Payer: Medicare HMO | Admitting: Internal Medicine

## 2023-10-31 VITALS — BP 120/64 | HR 68 | Ht 64.5 in | Wt 168.4 lb

## 2023-10-31 DIAGNOSIS — E890 Postprocedural hypothyroidism: Secondary | ICD-10-CM

## 2023-10-31 DIAGNOSIS — E210 Primary hyperparathyroidism: Secondary | ICD-10-CM

## 2023-10-31 DIAGNOSIS — C73 Malignant neoplasm of thyroid gland: Secondary | ICD-10-CM | POA: Diagnosis not present

## 2023-10-31 DIAGNOSIS — E559 Vitamin D deficiency, unspecified: Secondary | ICD-10-CM

## 2023-10-31 NOTE — Progress Notes (Signed)
 Patient ID: [name] [name], female   DOB: 1950-11-25, 73 y.o.   MRN: [id number]   HPI  [name] [name] is a 73 y.o.-year-old pleasant female, initially referred by Dr. Eletha, presenting for follow-up for thyroid  cancer, postsurgical hypothyroidism, history of primary hyperparathyroidism (s/p parathyroidectomy) and history of vitamin D  deficiency.  Last visit 1 year ago.  Interim history: No neck pressure, or pain.  She does have occasional choking.  No shortness of breath when laying down. Also, no perioral numbness or muscle cramping.  Reviewed thyroid  cancer history: Pt. has been dx with thyroid  cancer in 08/2016, after having had neck exploration, left superior parathyroidectomy, and, as the mass was observed in the left thyroid  lobe during surgery, she also had left lobectomy. The pathology of the thyroid  mass returned as papillary thyroid  carcinoma, Hurthle cell variant. This was 2 cm, positive surgical margins.  Diagnosis 1. Parathyroid  gland, Left Superior - ENLARGED PARATHYROID  TISSUE, 0.445 GRAMS. 2. Thyroid , lobectomy, Left - PAPILLARY THYROID  CARCINOMA, HURTHLE CELL VARIANT, 2.0 CM. - CARCINOMA IS PRESENT AT NON ORIENTED TISSUE EDGE(S). - SEE ONCOLOGY TABLE BELOW Microscopic Comment Specimen: Left thyroid  Procedure (including lymph node sampling if applicable): Hemithyroidectomy Specimen Integrity (intact/fragmented): Focally disrupted Tumor focality: Unifocal Dominant tumor: Maximum tumor size (cm): 2.0 cm (gross measurement) Tumor laterality: Left thyroid  Histologic type (including subtype and/or unique features as applicable): Papillary thyroid  carcinoma, Hurthle cell variant Tumor capsule: Not identified Extrathyroidal extension: Not identified Capsular invasion with degree of invasion if present: N/A Margins: Present at non-oriented tissue edge(s). Lymphatic or vascular invasion: Not identified Lymph nodes: # examined 0; # positive; N/A Extracapsular  extension (if applicable): Not identified TNM code: pT1b, pNX Non-neoplastic thyroid : Hashimoto's thyroiditis  Completion thyroidectomy on 12/09/2016: Right thyroid  lobe without malignancy.  RAI tx (02/09/2017): 70.8 mCi I131  Posttreatment whole body scan (02/21/2017): Negative for metastasis: 1. Expected activity in the thyroidectomy bed. 2. No evidence of metastatic disease.  Neck ultrasound (11/18/2017): As expected after thyroidectomy.  No recurrences.  Neck ultrasound (11/16/2018): No residual thyroid  tissue or pathogenically enlarged lymph nodes  Neck U/S (11/05/2022): Surgical changes of total thyroidectomy without evidence of residual or recurrent disease.  Latest thyroglobulin levels were undetectable: Lab Results  Component Value Date   THYROGLB <0.1 (L) 11/02/2022   THYROGLB <0.1 (L) 10/29/2021   THYROGLB <0.1 (L) 10/28/2020   THYROGLB <0.1 (L) 10/24/2019   THYROGLB <0.1 (L) 04/24/2019   THYROGLB <0.1 (L) 08/29/2018   THYROGLB <0.1 (L) 11/18/2017   THYROGLB <0.1 (L) 05/19/2017   THGAB <1.0 11/02/2022   THGAB <1.0 10/29/2021   THGAB <1.0 10/28/2020   THGAB <1 10/24/2019   THGAB <1.0 04/24/2019   THGAB 1 08/29/2018   THGAB <1.0 11/18/2017   THGAB 2 (H) 05/19/2017   Postsurgical hypothyroidism: -Uncontrolled   She could not tolerate Euthyrox  (dizziness, headaches, heart racing, restlessness-retrospectively, these could have been caused by arrhythmia) >> changed back to levothyroxine .  Pt is on levothyroxine  88 mcg daily (decreased 10/2021), taken: - in am, around 6:30 AM - fasting - at least 30 min from b'fast - no Ca, Fe, MVI, + PPIs later in the day (at night) prn - + on Biotin - off for 2 weeks  Reviewed TFTs: Lab Results  Component Value Date   TSH 0.91 11/02/2022   TSH 0.80 01/06/2022   TSH 0.25 (L) 10/29/2021   TSH 0.22 (L) 10/28/2020   TSH 0.81 10/24/2019   TSH 0.51 04/24/2019   TSH 4.09 10/27/2018   TSH 0.320 (  L) 09/14/2018   TSH 0.24 (L)  08/29/2018   TSH 0.62 03/06/2018   FREET4 0.87 11/02/2022   FREET4 1.18 01/06/2022   FREET4 1.16 10/29/2021   FREET4 1.01 10/28/2020   FREET4 0.94 10/24/2019   FREET4 1.14 04/24/2019   FREET4 0.90 10/27/2018   FREET4 1.23 08/29/2018   FREET4 0.91 03/06/2018   FREET4 1.05 01/11/2018  07/2021: by PCP: ? 01/01/2019:TSH 7.16 (0.45-4.5) 12/21/2016: TSH 15.3  She does have GERD and hoarseness.  She has + FH of thyroid  disorders in: sister. No FH of thyroid  cancer. No h/o radiation tx to head or neck other than RAI treatment. No herbal supplements. + Biotin use. No recent steroids use.   History of primary hyperparathyroidism: - s/p resection of a parathyroid  adenoma weighing 445 mg in 08/2016  Reviewed pertinent labs: Lab Results  Component Value Date   PTH 21 01/19/2017   PTH Comment 01/19/2017   CALCIUM 9.1 11/18/2022   CALCIUM 8.5 (L) 11/19/2021   CALCIUM 9.2 08/25/2021   CALCIUM 9.2 10/28/2020   CALCIUM 8.7 (L) 01/11/2019   CALCIUM 9.0 10/28/2018   CALCIUM 8.2 (L) 09/14/2018   CALCIUM 8.9 11/18/2017   CALCIUM 9.1 01/19/2017   CALCIUM 8.5 (L) 12/10/2016  Reportedly normal calcium level 08/2020  Vitamin D  deficiency:  He has a history of vitamin D  deficiency: Lab Results  Component Value Date   VD25OH 35.13 01/06/2022   VD25OH 26.59 (L) 10/29/2021   VD25OH 30.09 10/28/2020   VD25OH 31.6 10/24/2019   VD25OH 44.74 04/24/2019   VD25OH 35.36 08/29/2018   VD25OH 36.69 01/11/2018   VD25OH 28.30 (L) 11/18/2017   VD25OH 32.06 05/19/2017   VD25OH 28.28 (L) 02/09/2017   Previously on vitamin D  5000 units once a week >> ran out in 2021 and did not restart, despite repeated advised to start 1000 units daily.  She had a syncopal episode on 09/14/2018.  She had a pacemaker implanted.  She was found to have dilated cardiomyopathy.  Also, complete heart block.  She walks her dog daily >> 1 mi a day.  ROS: + see HPI  I reviewed pt's medications, allergies, PMH, social hx,  family hx, and changes were documented in the history of present illness. Otherwise, unchanged from my initial visit note.  Past Medical History:  Diagnosis Date   Anxiety    because of parathyroid  issues   Arthritis    hands   Asthma    x 2, triggered by allergies   Cancer (HCC)    melanoma removed from back, thyroid    Depression    bc of parathyroid  problems   GERD (gastroesophageal reflux disease)    History of palpitations    none since 2016   Pre-diabetes    Seasonal allergies    Varicose vein    Past Surgical History:  Procedure Laterality Date   BIV PACEMAKER INSERTION CRT-P N/A 10/30/2018   Procedure: BIV PACEMAKER INSERTION CRT-P;  Surgeon: Waddell Danelle ORN, MD;  Location: [facility];  Service: Cardiovascular;  Laterality: N/A;   CARPAL TUNNEL RELEASE Right 02/05/2014   Procedure: RIGHT CARPAL TUNNEL RELEASE;  Surgeon: Lynwood FORBES Better, MD;  Location: [facility];  Service: Orthopedics;  Laterality: Right;   COLONOSCOPY     EYE SURGERY     Bilateral Lasik   LAPAROSCOPIC ASSISTED VAGINAL HYSTERECTOMY Bilateral 10/11/2013   Procedure: LAPAROSCOPIC ASSISTED VAGINAL HYSTERECTOMY BILATERAL SALPINGO OOPHORECTOMY;  Surgeon: Lynwood FORBES Curlene PONCE, MD;  Location: [facility];  Service: Gynecology;  Laterality: Bilateral;   Malignant melanoma resected     lower back area   PARATHYROIDECTOMY Left 08/24/2016   Procedure: NECK EXPLORATION LEFT SUPERIOR PARATHYROIDECTOMY LEFT LOBE THYROIDECTOMY;  Surgeon: Eletha Boas, MD;  Location: [facility];  Service: General;  Laterality: Left;   RIGHT/LEFT HEART CATH AND CORONARY ANGIOGRAPHY N/A 01/15/2019   Procedure: RIGHT/LEFT HEART CATH AND CORONARY ANGIOGRAPHY;  Surgeon: Claudene Victory ORN, MD;  Location: [facility];  Service: Cardiovascular;  Laterality: N/A;   THYROIDECTOMY N/A 12/09/2016   Procedure: COMPLETION THYROIDECTOMY RIGHT LOBE;  Surgeon: Eletha Boas, MD;  Location: [facility];  Service: General;  Laterality: N/A;   trigger  thumb surgery     right   TUBAL LIGATION     VARICOSE VEIN SURGERY     leg   Social History   Socioeconomic History   Marital status: Married    Spouse name: Not on file   Number of children: 2   Years of education: Not on file   Highest education level: Not on file  Occupational History   Occupation: Retired  Tobacco Use   Smoking status: Former    Current packs/day: 0.00    Average packs/day: 1 pack/day for 10.0 years (10.0 ttl pk-yrs)    Types: Cigarettes    Start date: 01/04/1981    Quit date: 01/05/1991    Years since quitting: 32.8   Smokeless tobacco: Never  Vaping Use   Vaping status: Never Used  Substance and Sexual Activity   Alcohol use: No   Drug use: No   Sexual activity: Yes    Birth control/protection: Post-menopausal, Surgical  Other Topics Concern   Not on file  Social History Narrative   Not on file   Social Drivers of Health   Financial Resource Strain: Not on file  Food Insecurity: Not on file  Transportation Needs: Not on file  Physical Activity: Not on file  Stress: Not on file  Social Connections: Not on file  Intimate Partner Violence: Not on file   Current Outpatient Medications on File Prior to Visit  Medication Sig Dispense Refill   Ascorbic Acid (VITAMIN C) 1000 MG tablet Take 1,000 mg by mouth 3 (three) times a week. (Gummies) (Patient not taking: Reported on 10/28/2023)     BIOTIN PO Patient take 2 gummies by mouth daily     cetirizine (ZYRTEC) 10 MG tablet Take 10 mg by mouth daily. (Patient not taking: Reported on 10/28/2023)     ELDERBERRY PO Patient take 2 gummies by mouth daily (Patient not taking: Reported on 10/28/2023)     ENTRESTO  24-26 MG TAKE 1 TABLET BY MOUTH TWICE A DAY 180 tablet 3   levothyroxine  (SYNTHROID ) 88 MCG tablet TAKE 1 TABLET BY MOUTH EVERY DAY 90 tablet 3   metoprolol  succinate (TOPROL -XL) 25 MG 24 hr tablet TAKE 1 TABLET (25 MG TOTAL) BY MOUTH DAILY. 90 tablet 1   minocycline  (MINOCIN ,DYNACIN ) 100 MG capsule  Take 100 mg by mouth See admin instructions. Takes 100mg  daily for 7-10 days when she has an outbreak. (Patient taking differently: Take 100 mg by mouth See admin instructions. Takes 100mg  daily for 7-10 days when she has an outbreak. As needed)     naproxen  sodium (ALEVE ) 220 MG tablet Take 440 mg by mouth daily as needed (headache/pain).      omeprazole (PRILOSEC) 20 MG capsule Take 20 mg by mouth at bedtime.     spironolactone  (ALDACTONE ) 25 MG tablet TAKE 1/2 TABLET BY MOUTH EVERY DAY  45 tablet 2   Current Facility-Administered Medications on File Prior to Visit  Medication Dose Route Frequency Provider Last Rate Last Admin   sodium chloride  flush (NS) 0.9 % injection 3 mL  3 mL Intravenous Q12H Delford Maude BROCKS, MD       Allergies  Allergen Reactions   Guaifenesin & Derivatives Nausea And Vomiting    Tremors, anxiety   Penicillins     Pt states it does not work Did it involve swelling of the face/tongue/throat, SOB, or low BP? No Did it involve sudden or severe rash/hives, skin peeling, or any reaction on the inside of your mouth or nose? No Did you need to seek medical attention at a hospital or doctor's office? No When did it last happen?      5 years If all above answers are "NO", may proceed with cephalosporin use.  Pt states it does not work Pt states it does not work Pt states it does not work Did it involve swelling of the face/tongue/throat, SOB, or low BP? No Did it involve sudden or severe rash/hives, skin peeling, or any reaction on the inside of your mouth or nose? No Did you need to seek medical attention at a hospital or doctor's office? No When did it last happen?      5 years If all above answers are "NO", may proceed with cephalosporin use.   Sulfamethoxazole Other (See Comments)    Pt states it doesn't work and she doesn't want to take it    Family History  Problem Relation Age of Onset   Diabetes Mother 3       deceased   Heart failure Mother    CAD  Mother    Heart attack Father 23       deceased   Heart attack Sister 41       deceased   Heart failure Brother 71       deceased   PE: BP 120/64   Pulse 68   Ht 5' 4.5 (1.638 m)   Wt 168 lb 6.4 oz (76.4 kg)   SpO2 96%   BMI 28.46 kg/m  Wt Readings from Last 3 Encounters:  10/31/23 168 lb 6.4 oz (76.4 kg)  10/28/23 170 lb (77.1 kg)  11/18/22 170 lb 9.6 oz (77.4 kg)   Constitutional: overweight, in NAD Eyes:  EOMI, no exophthalmos ENT: no neck masses, no cervical lymphadenopathy Cardiovascular: RRR, No MRG Respiratory: CTA B Musculoskeletal: no deformities Skin:no rashes Neurological: no tremor with outstretched hands  ASSESSMENT: 1. Thyroid  cancer - see HPI  2. Postsurgical hypothyroidism  3. H/o HPTH  4. Vitamin D  def  PLAN:  1. Thyroid  cancer -papillary, Hurthle cell variant -Patient has a history of incidental diagnosis of thyroid  cancer, after left lobectomy, noticed during surgery for parathyroid  adenoma.  At that time, the left lobe appeared abnormal and was resected.  She had a 2 cm PTC focus which appeared to be a Hurthle cell variant.  She is stage I TNM. However, due to the fact that her the cell variant of PTC is more aggressive, I recommended completion thyroidectomy and RAI treatment afterwards.  She had completion thyroidectomy by Dr. Eletha on 12/09/2017 and then RAI treatment with 70.8 mCi I-131 on 02/09/2017. - We previously were using the [facility] thyroglobulin assay due to history of positive ATA antibodies.  However, these have become undetectable.  Thyroglobulin levels were also undetectable, including at last visit. - After last visit, we rechecked a neck ultrasound (  11/05/2022) and this did not show any suspicious masses or any signs of thyroid  cancer recurrence - I will have the patient return in 1 year  2. Postsurgical hypothyroidism - latest thyroid  labs reviewed with pt. >> normal: Lab Results  Component Value Date   TSH 0.91 11/02/2022  -  she continues on LT4 88 mcg daily - pt feels good on this dose. - we discussed about taking the thyroid  hormone every day, with water , >30 minutes before breakfast, separated by >4 hours from acid reflux medications, calcium, iron, multivitamins. Pt. is taking it correctly. - will check thyroid  tests today: TSH and fT4 - If labs are abnormal, she will need to return for repeat TFTs in 1.5 months  3. H/o Primary HPTH -She is s/p resection of a parathyroid  adenoma weighing 4.45 g in 08/2016 - Calcium level normalized initially but then she had intermittently low levels, possibly due to vitamin D  deficiency Lab Results  Component Value Date   CALCIUM 9.1 11/18/2022  -We will recheck her ionized calcium today  4. Vit D def -Previously on 5000 units vitamin D  once a week but she ran out, afterwards, advised her to restart 1000 units vitamin D  daily, but before last visit she was off the supplement again >> forgot - Latest vitamin D  level was normal: Lab Results  Component Value Date   VD25OH 35.13 01/06/2022  -Will recheck a vitamin D  level now  Needs refills - 88 mcg LT4.  I was able to review more recent labs after the visit: 08/16/2023: Vitamin D  19, calcium 9.2, albumin 4.2, TSH 0.252 Will most likely need to change the dose of levothyroxine  and vitamin D  after the results return today.  Orders Placed This Encounter  Procedures   TSH   T4, free   Vitamin D , 25-hydroxy   Calcium, ionized   Thyroglobulin Level   Thyroglobulin antibody   Lela Fendt, MD PhD [facility]

## 2023-10-31 NOTE — Patient Instructions (Signed)
 Please stop at the lab.  Please continue 88 mcg daily of Levothyroxine .  Take the thyroid  hormone every day, with water , at least 30 minutes before breakfast, separated by at least 4 hours from: - acid reflux medications - calcium - iron - multivitamins  Please come back for a follow-up appointment in 1 year.

## 2023-11-01 ENCOUNTER — Ambulatory Visit (INDEPENDENT_AMBULATORY_CARE_PROVIDER_SITE_OTHER): Payer: Medicare HMO

## 2023-11-01 DIAGNOSIS — I5022 Chronic systolic (congestive) heart failure: Secondary | ICD-10-CM | POA: Diagnosis not present

## 2023-11-01 DIAGNOSIS — Z23 Encounter for immunization: Secondary | ICD-10-CM | POA: Diagnosis not present

## 2023-11-02 LAB — CUP PACEART REMOTE DEVICE CHECK
Date Time Interrogation Session: 20251029091333
Implantable Lead Connection Status: 753985
Implantable Lead Connection Status: 753985
Implantable Lead Connection Status: 753985
Implantable Lead Implant Date: 20201026
Implantable Lead Implant Date: 20201026
Implantable Lead Implant Date: 20201026
Implantable Lead Location: 753858
Implantable Lead Location: 753859
Implantable Lead Location: 753860
Implantable Lead Model: 377
Implantable Lead Model: 377
Implantable Lead Model: 401183
Implantable Lead Serial Number: 81173669
Implantable Lead Serial Number: 81216766
Implantable Lead Serial Number: 81267228
Implantable Pulse Generator Implant Date: 20201026
Pulse Gen Model: 407137
Pulse Gen Serial Number: 69580491

## 2023-11-02 LAB — THYROGLOBULIN LEVEL: Thyroglobulin: <0.1 ng/mL — ABNORMAL LOW

## 2023-11-02 LAB — VITAMIN D 25 HYDROXY (VIT D DEFICIENCY, FRACTURES): Vit D, 25-Hydroxy: 34 ng/mL (ref 30–100)

## 2023-11-02 LAB — TSH: TSH: 0.38 m[IU]/L — ABNORMAL LOW (ref 0.40–4.50)

## 2023-11-02 LAB — CALCIUM, IONIZED: Calcium, Ion: 5 mg/dL (ref 4.7–5.5)

## 2023-11-02 LAB — T4, FREE: Free T4: 1.3 ng/dL (ref 0.8–1.8)

## 2023-11-02 LAB — THYROGLOBULIN ANTIBODY: Thyroglobulin Ab: <1 [IU]/mL (ref <=1)

## 2023-11-04 ENCOUNTER — Telehealth: Payer: Self-pay | Admitting: Nurse Practitioner

## 2023-11-04 ENCOUNTER — Ambulatory Visit: Payer: Self-pay | Admitting: Internal Medicine

## 2023-11-04 DIAGNOSIS — E890 Postprocedural hypothyroidism: Secondary | ICD-10-CM

## 2023-11-04 MED ORDER — SACUBITRIL-VALSARTAN 24-26 MG PO TABS: 1.0000 | ORAL_TABLET | Freq: Two times a day (BID) | ORAL | 0 refills | Status: DC

## 2023-11-04 MED ORDER — LEVOTHYROXINE SODIUM 75 MCG PO TABS: 75.0000 ug | ORAL_TABLET | Freq: Every day | ORAL | 8 refills | Status: AC

## 2023-11-04 NOTE — Addendum Note (Signed)
 Addended by: TRIXIE FILE on: 11/04/2023 01:18 PM   Modules accepted: Orders

## 2023-11-04 NOTE — Telephone Encounter (Signed)
Refill for [name] [name] has been [facility] to [facility]

## 2023-11-04 NOTE — Telephone Encounter (Signed)
*  STAT* If patient is at the pharmacy, call can be transferred to refill team.   1. Which medications need to be refilled? (please list name of each medication and dose if known)   ENTRESTO  24-26 MG     2. Would you like to learn more about the convenience, safety, & potential cost savings by using the [facility]? No    3. Are you open to using the [facility] (Type [facility]. No    4. Which pharmacy/location (including street and city if local pharmacy) is medication to be sent to?[id number] [place], [place] - [address]    5. Do they need a 30 day or 90 day supply? 30 day Pt is out of medication

## 2023-11-08 NOTE — Progress Notes (Signed)
 Remote PPM Transmission

## 2023-11-09 ENCOUNTER — Other Ambulatory Visit: Payer: Self-pay | Admitting: Internal Medicine

## 2023-11-09 DIAGNOSIS — E890 Postprocedural hypothyroidism: Secondary | ICD-10-CM

## 2023-11-10 ENCOUNTER — Ambulatory Visit: Payer: Self-pay | Admitting: Internal Medicine

## 2023-11-24 ENCOUNTER — Other Ambulatory Visit: Payer: Self-pay | Admitting: Cardiovascular Disease

## 2023-11-25 MED ORDER — SPIRONOLACTONE 25 MG PO TABS
12.5000 mg | ORAL_TABLET | Freq: Every day | ORAL | 0 refills | Status: AC
Start: 2023-11-25 — End: ?

## 2023-11-25 MED ORDER — METOPROLOL SUCCINATE ER 25 MG PO TB24
25.0000 mg | ORAL_TABLET | Freq: Every day | ORAL | 0 refills | Status: AC
Start: 2023-11-25 — End: ?

## 2023-12-20 ENCOUNTER — Other Ambulatory Visit: Payer: Self-pay | Admitting: Cardiovascular Disease

## 2023-12-26 ENCOUNTER — Other Ambulatory Visit

## 2023-12-26 DIAGNOSIS — E890 Postprocedural hypothyroidism: Secondary | ICD-10-CM | POA: Diagnosis not present

## 2023-12-27 ENCOUNTER — Ambulatory Visit: Payer: Self-pay | Admitting: Internal Medicine

## 2023-12-27 LAB — TSH: TSH: 3.66 m[IU]/L (ref 0.40–4.50)

## 2023-12-27 LAB — T4, FREE: Free T4: 1.2 ng/dL (ref 0.8–1.8)

## 2023-12-29 ENCOUNTER — Other Ambulatory Visit: Payer: Self-pay | Admitting: Cardiovascular Disease

## 2024-01-28 ENCOUNTER — Other Ambulatory Visit: Payer: Self-pay | Admitting: Cardiovascular Disease

## 2024-01-29 ENCOUNTER — Other Ambulatory Visit: Payer: Self-pay | Admitting: Cardiovascular Disease

## 2024-01-31 ENCOUNTER — Ambulatory Visit: Attending: Student in an Organized Health Care Education/Training Program

## 2024-01-31 DIAGNOSIS — I442 Atrioventricular block, complete: Secondary | ICD-10-CM | POA: Diagnosis not present

## 2024-02-01 LAB — CUP PACEART REMOTE DEVICE CHECK
Battery Voltage: 45
Date Time Interrogation Session: 20260127080850
Implantable Lead Connection Status: 753985
Implantable Lead Connection Status: 753985
Implantable Lead Connection Status: 753985
Implantable Lead Implant Date: 20201026
Implantable Lead Implant Date: 20201026
Implantable Lead Implant Date: 20201026
Implantable Lead Location: 753858
Implantable Lead Location: 753859
Implantable Lead Location: 753860
Implantable Lead Model: 377
Implantable Lead Model: 377
Implantable Lead Model: 401183
Implantable Lead Serial Number: 81173669
Implantable Lead Serial Number: 81216766
Implantable Lead Serial Number: 81267228
Implantable Pulse Generator Implant Date: 20201026
Pulse Gen Model: 407137
Pulse Gen Serial Number: 69580491

## 2024-02-03 NOTE — Telephone Encounter (Signed)
 In accordance with refill protocols, please review and address the following requirements before this medication refill can be authorized:  Appointment  and Labs  Pt has an upcoming appt on 03/16/2024 with primary Cardiologist but pt need labs within 180 days within normal range and pt does not have this

## 2024-02-05 ENCOUNTER — Ambulatory Visit: Payer: Self-pay | Admitting: Student in an Organized Health Care Education/Training Program

## 2024-02-08 NOTE — Progress Notes (Signed)
 Remote PPM Transmission

## 2024-03-16 ENCOUNTER — Ambulatory Visit: Admitting: Physician Assistant

## 2024-03-21 ENCOUNTER — Ambulatory Visit

## 2024-05-01 ENCOUNTER — Ambulatory Visit: Payer: Self-pay

## 2024-06-20 ENCOUNTER — Ambulatory Visit

## 2024-07-31 ENCOUNTER — Ambulatory Visit: Payer: Self-pay

## 2024-09-19 ENCOUNTER — Ambulatory Visit

## 2024-10-30 ENCOUNTER — Ambulatory Visit: Payer: Self-pay

## 2024-10-30 ENCOUNTER — Ambulatory Visit: Admitting: Internal Medicine

## 2024-12-19 ENCOUNTER — Ambulatory Visit

## 2025-01-29 ENCOUNTER — Ambulatory Visit: Payer: Self-pay

## 2025-03-20 ENCOUNTER — Ambulatory Visit
